# Patient Record
Sex: Male | Born: 1970 | Race: White | Hispanic: No | Marital: Single | Smoking: Current every day smoker
Health system: Southern US, Community
[De-identification: ages and names within clinical notes are randomized; demographics above are authoritative.]

## PROBLEM LIST (undated history)

## (undated) DIAGNOSIS — F319 Bipolar disorder, unspecified: Secondary | ICD-10-CM

## (undated) DIAGNOSIS — T1491XA Suicide attempt, initial encounter: Secondary | ICD-10-CM

## (undated) DIAGNOSIS — F329 Major depressive disorder, single episode, unspecified: Secondary | ICD-10-CM

## (undated) DIAGNOSIS — G8929 Other chronic pain: Secondary | ICD-10-CM

## (undated) DIAGNOSIS — M549 Dorsalgia, unspecified: Secondary | ICD-10-CM

## (undated) DIAGNOSIS — M1990 Unspecified osteoarthritis, unspecified site: Secondary | ICD-10-CM

## (undated) DIAGNOSIS — F200 Paranoid schizophrenia: Secondary | ICD-10-CM

## (undated) DIAGNOSIS — J45909 Unspecified asthma, uncomplicated: Secondary | ICD-10-CM

## (undated) DIAGNOSIS — J449 Chronic obstructive pulmonary disease, unspecified: Secondary | ICD-10-CM

## (undated) DIAGNOSIS — F32A Depression, unspecified: Secondary | ICD-10-CM

## (undated) DIAGNOSIS — J302 Other seasonal allergic rhinitis: Secondary | ICD-10-CM

---

## 2000-01-23 ENCOUNTER — Inpatient Hospital Stay (HOSPITAL_COMMUNITY): Admission: EM | Admit: 2000-01-23 | Discharge: 2000-01-28 | Payer: Self-pay | Admitting: *Deleted

## 2000-01-23 ENCOUNTER — Encounter: Payer: Self-pay | Admitting: Emergency Medicine

## 2000-01-23 ENCOUNTER — Emergency Department (HOSPITAL_COMMUNITY): Admission: EM | Admit: 2000-01-23 | Discharge: 2000-01-23 | Payer: Self-pay

## 2000-11-07 ENCOUNTER — Inpatient Hospital Stay (HOSPITAL_COMMUNITY): Admission: EM | Admit: 2000-11-07 | Discharge: 2000-11-12 | Payer: Self-pay | Admitting: Psychiatry

## 2005-01-02 ENCOUNTER — Emergency Department (HOSPITAL_COMMUNITY): Admission: EM | Admit: 2005-01-02 | Discharge: 2005-01-02 | Payer: Self-pay | Admitting: Emergency Medicine

## 2005-01-12 ENCOUNTER — Emergency Department (HOSPITAL_COMMUNITY): Admission: EM | Admit: 2005-01-12 | Discharge: 2005-01-12 | Payer: Self-pay | Admitting: Emergency Medicine

## 2006-03-28 ENCOUNTER — Emergency Department (HOSPITAL_COMMUNITY): Admission: EM | Admit: 2006-03-28 | Discharge: 2006-03-28 | Payer: Self-pay | Admitting: Emergency Medicine

## 2006-05-18 ENCOUNTER — Emergency Department (HOSPITAL_COMMUNITY): Admission: EM | Admit: 2006-05-18 | Discharge: 2006-05-18 | Payer: Self-pay | Admitting: Emergency Medicine

## 2006-05-26 ENCOUNTER — Emergency Department (HOSPITAL_COMMUNITY): Admission: EM | Admit: 2006-05-26 | Discharge: 2006-05-26 | Payer: Self-pay | Admitting: Emergency Medicine

## 2006-06-05 ENCOUNTER — Ambulatory Visit: Payer: Self-pay | Admitting: Family Medicine

## 2006-06-05 DIAGNOSIS — J45909 Unspecified asthma, uncomplicated: Secondary | ICD-10-CM | POA: Insufficient documentation

## 2006-06-05 DIAGNOSIS — F319 Bipolar disorder, unspecified: Secondary | ICD-10-CM | POA: Insufficient documentation

## 2006-06-05 DIAGNOSIS — J309 Allergic rhinitis, unspecified: Secondary | ICD-10-CM | POA: Insufficient documentation

## 2006-06-05 DIAGNOSIS — F250 Schizoaffective disorder, bipolar type: Secondary | ICD-10-CM | POA: Insufficient documentation

## 2006-06-05 DIAGNOSIS — M545 Low back pain: Secondary | ICD-10-CM

## 2006-06-05 DIAGNOSIS — R5383 Other fatigue: Secondary | ICD-10-CM

## 2006-06-05 DIAGNOSIS — F17200 Nicotine dependence, unspecified, uncomplicated: Secondary | ICD-10-CM | POA: Insufficient documentation

## 2006-06-05 DIAGNOSIS — L723 Sebaceous cyst: Secondary | ICD-10-CM

## 2006-06-05 DIAGNOSIS — R5381 Other malaise: Secondary | ICD-10-CM

## 2006-06-13 ENCOUNTER — Telehealth (INDEPENDENT_AMBULATORY_CARE_PROVIDER_SITE_OTHER): Payer: Self-pay | Admitting: Family Medicine

## 2006-06-13 ENCOUNTER — Encounter (INDEPENDENT_AMBULATORY_CARE_PROVIDER_SITE_OTHER): Payer: Self-pay | Admitting: Family Medicine

## 2006-06-13 ENCOUNTER — Ambulatory Visit (HOSPITAL_COMMUNITY): Admission: RE | Admit: 2006-06-13 | Discharge: 2006-06-13 | Payer: Self-pay | Admitting: Family Medicine

## 2006-06-14 ENCOUNTER — Encounter (INDEPENDENT_AMBULATORY_CARE_PROVIDER_SITE_OTHER): Payer: Self-pay | Admitting: Family Medicine

## 2006-06-15 LAB — CONVERTED CEMR LAB
Albumin: 4.4 g/dL (ref 3.5–5.2)
BUN: 12 mg/dL (ref 6–23)
Basophils Relative: 1 % (ref 0–1)
Calcium: 9.2 mg/dL (ref 8.4–10.5)
Eosinophils Absolute: 1.1 10*3/uL — ABNORMAL HIGH (ref 0.0–0.7)
Eosinophils Relative: 11 % — ABNORMAL HIGH (ref 0–5)
HCT: 50.1 % (ref 39.0–52.0)
Lymphs Abs: 3.4 10*3/uL — ABNORMAL HIGH (ref 0.7–3.3)
MCHC: 33.7 g/dL (ref 30.0–36.0)
MCV: 96.3 fL (ref 78.0–100.0)
Monocytes Relative: 10 % (ref 3–11)
Neutro Abs: 4.3 10*3/uL (ref 1.7–7.7)
Neutrophils Relative %: 44 % (ref 43–77)
Potassium: 4.8 meq/L (ref 3.5–5.3)
RBC: 5.2 M/uL (ref 4.22–5.81)
RDW: 13.2 % (ref 11.5–14.0)
Sodium: 144 meq/L (ref 135–145)
TSH: 2.309 microintl units/mL (ref 0.350–5.50)
Total Bilirubin: 0.7 mg/dL (ref 0.3–1.2)
Total CHOL/HDL Ratio: 5
Total Protein: 6.7 g/dL (ref 6.0–8.3)
WBC: 9.8 10*3/uL (ref 4.0–10.5)

## 2006-07-28 ENCOUNTER — Emergency Department (HOSPITAL_COMMUNITY): Admission: EM | Admit: 2006-07-28 | Discharge: 2006-07-28 | Payer: Self-pay | Admitting: Emergency Medicine

## 2006-07-29 ENCOUNTER — Ambulatory Visit: Payer: Self-pay | Admitting: *Deleted

## 2006-07-29 ENCOUNTER — Inpatient Hospital Stay (HOSPITAL_COMMUNITY): Admission: AD | Admit: 2006-07-29 | Discharge: 2006-08-01 | Payer: Self-pay | Admitting: *Deleted

## 2007-01-08 ENCOUNTER — Telehealth (INDEPENDENT_AMBULATORY_CARE_PROVIDER_SITE_OTHER): Payer: Self-pay | Admitting: Family Medicine

## 2007-01-08 ENCOUNTER — Emergency Department (HOSPITAL_COMMUNITY): Admission: EM | Admit: 2007-01-08 | Discharge: 2007-01-08 | Payer: Self-pay | Admitting: Emergency Medicine

## 2007-01-09 ENCOUNTER — Telehealth (INDEPENDENT_AMBULATORY_CARE_PROVIDER_SITE_OTHER): Payer: Self-pay | Admitting: *Deleted

## 2007-01-28 IMAGING — CR DG ABDOMEN 1V
1 series · 1 of 1 positions shown · non-contrast
Comparison: none

CLINICAL DATA: Involuntary commitment. Eating pennies.  
 ABDOMEN - [id number]: 
 Single view of the abdomen without previous films for comparison shows what appears to be a coin overlying the right lower quadrant of the abdomen.  There appears to be a single coin in the bowel.  No obstruction is seen.   There is some gas seen through the rectum.  The bones of the lumbar spine and pelvis are normal.

[view not recorded]
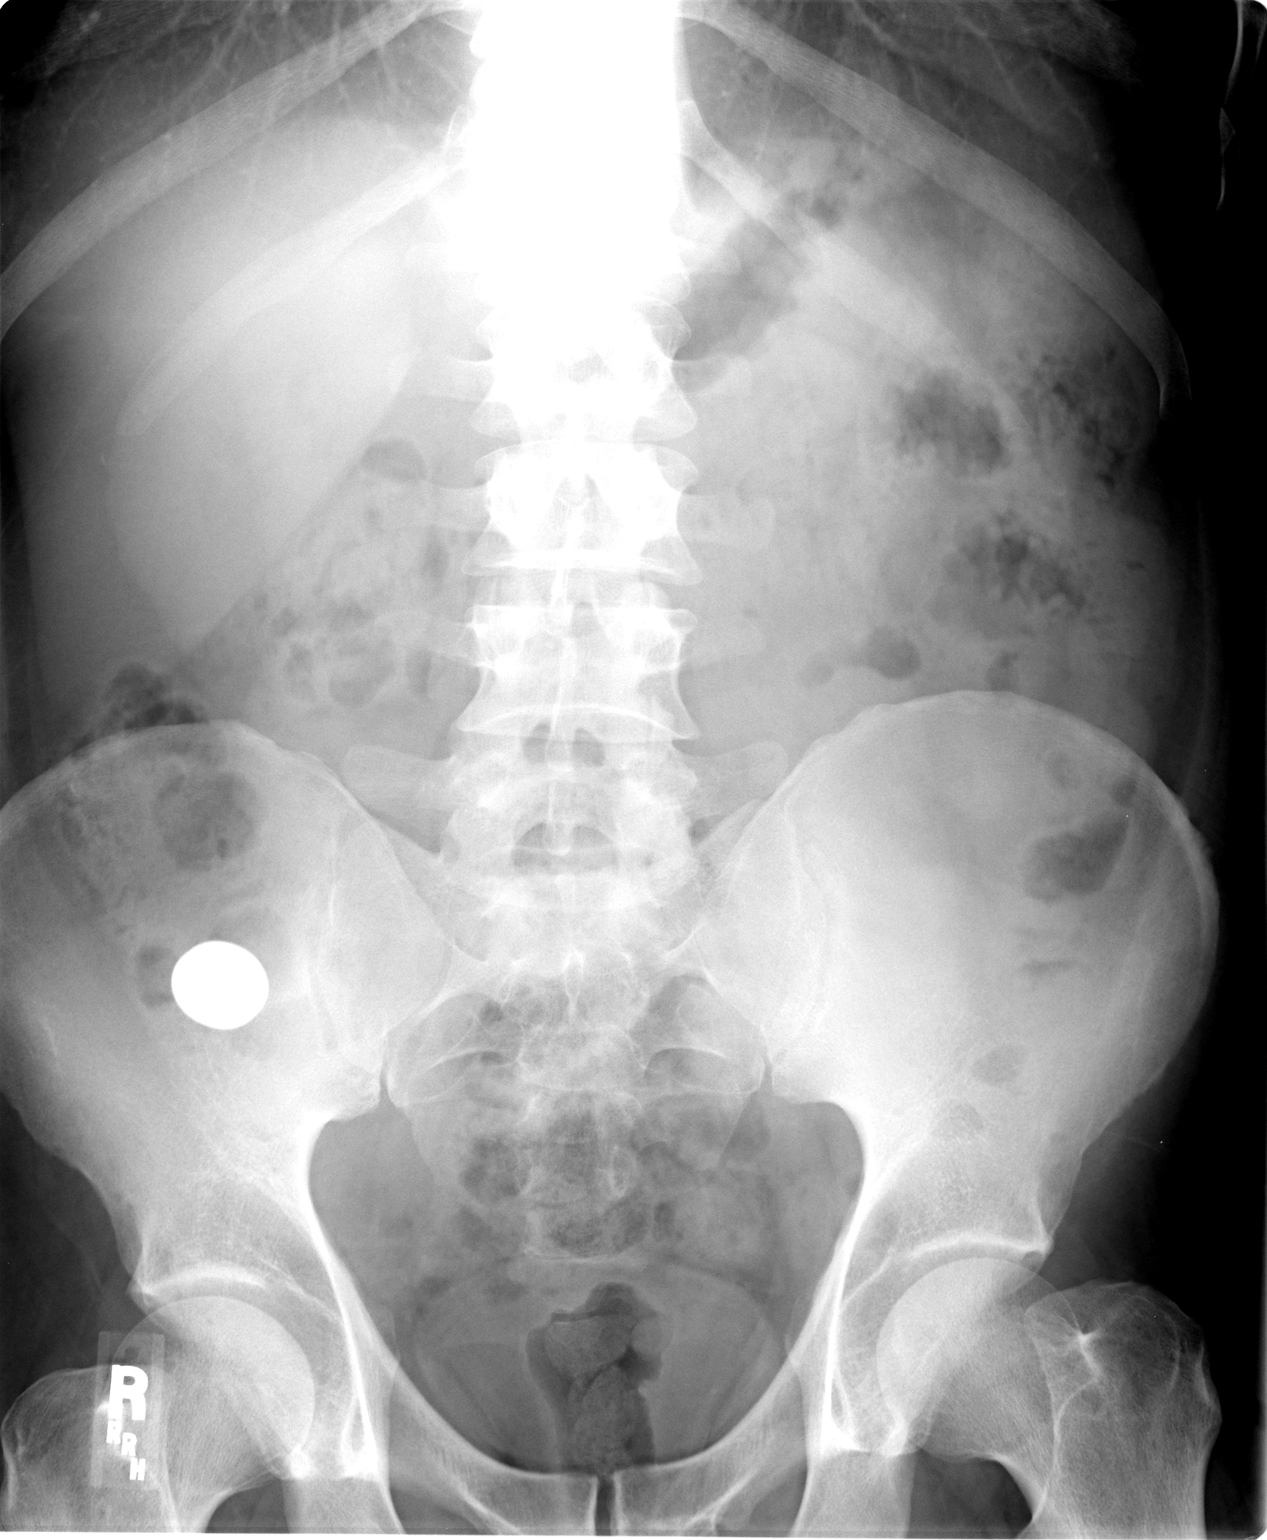

[1 of 1 positions shown; findings below may reference images not displayed]

IMPRESSION: Coin in right lower quadrant of the abdomen in the area of the cecum. No evidence of obstruction of the bowel.

## 2008-08-07 ENCOUNTER — Encounter (INDEPENDENT_AMBULATORY_CARE_PROVIDER_SITE_OTHER): Payer: Self-pay | Admitting: Family Medicine

## 2008-10-08 ENCOUNTER — Emergency Department: Payer: Self-pay | Admitting: Emergency Medicine

## 2008-10-11 ENCOUNTER — Emergency Department (HOSPITAL_COMMUNITY): Admission: EM | Admit: 2008-10-11 | Discharge: 2008-10-11 | Payer: Self-pay | Admitting: Emergency Medicine

## 2008-12-28 ENCOUNTER — Emergency Department: Payer: Self-pay | Admitting: Emergency Medicine

## 2009-06-01 ENCOUNTER — Emergency Department: Payer: Self-pay | Admitting: Emergency Medicine

## 2009-09-13 ENCOUNTER — Emergency Department (HOSPITAL_COMMUNITY): Admission: EM | Admit: 2009-09-13 | Discharge: 2009-09-13 | Payer: Self-pay | Admitting: Emergency Medicine

## 2009-11-09 ENCOUNTER — Emergency Department (HOSPITAL_COMMUNITY)
Admission: EM | Admit: 2009-11-09 | Discharge: 2009-11-09 | Payer: Self-pay | Source: Home / Self Care | Admitting: Emergency Medicine

## 2010-04-02 ENCOUNTER — Emergency Department (HOSPITAL_COMMUNITY)
Admission: EM | Admit: 2010-04-02 | Discharge: 2010-04-03 | Disposition: A | Discharge status: Nursing Home (Includes ICF) | Payer: PRIVATE HEALTH INSURANCE | Attending: Emergency Medicine | Admitting: Emergency Medicine

## 2010-04-02 DIAGNOSIS — F205 Residual schizophrenia: Secondary | ICD-10-CM | POA: Insufficient documentation

## 2010-04-02 LAB — DIFFERENTIAL
Basophils Relative: 1 % (ref 0–1)
Eosinophils Absolute: 1.4 10*3/uL — ABNORMAL HIGH (ref 0.0–0.7)
Eosinophils Relative: 12 % — ABNORMAL HIGH (ref 0–5)
Monocytes Absolute: 1.1 10*3/uL — ABNORMAL HIGH (ref 0.1–1.0)
Neutro Abs: 6.7 10*3/uL (ref 1.7–7.7)
Neutrophils Relative %: 54 % (ref 43–77)

## 2010-04-02 LAB — BASIC METABOLIC PANEL
BUN: 5 mg/dL — ABNORMAL LOW (ref 6–23)
Chloride: 104 mEq/L (ref 96–112)
GFR calc non Af Amer: >60 mL/min (ref >60)
Glucose, Bld: 105 mg/dL — ABNORMAL HIGH (ref 70–99)
Potassium: 3.9 mEq/L (ref 3.5–5.1)

## 2010-04-02 LAB — ETHANOL: Alcohol, Ethyl (B): <5 mg/dL (ref 0–10)

## 2010-04-02 LAB — CBC
Hemoglobin: 16.4 g/dL (ref 13.0–17.0)
MCH: 32.5 pg (ref 26.0–34.0)
MCHC: 35.4 g/dL (ref 30.0–36.0)
Platelets: 276 10*3/uL (ref 150–400)
RDW: 12.4 % (ref 11.5–15.5)

## 2010-04-02 LAB — RAPID URINE DRUG SCREEN, HOSP PERFORMED: Cocaine: NOT DETECTED

## 2010-05-13 LAB — RAPID URINE DRUG SCREEN, HOSP PERFORMED
Barbiturates: NOT DETECTED
Cocaine: NOT DETECTED
Tetrahydrocannabinol: NOT DETECTED

## 2010-05-13 LAB — ETHANOL: Alcohol, Ethyl (B): <5 mg/dL (ref 0–10)

## 2010-05-13 LAB — CBC
Hemoglobin: 15.5 g/dL (ref 13.0–17.0)
MCHC: 34.8 g/dL (ref 30.0–36.0)
RBC: 4.63 MIL/uL (ref 4.22–5.81)
WBC: 9.8 10*3/uL (ref 4.0–10.5)

## 2010-05-13 LAB — DIFFERENTIAL
Basophils Relative: 1 % (ref 0–1)
Eosinophils Absolute: 1.3 10*3/uL — ABNORMAL HIGH (ref 0.0–0.7)
Monocytes Absolute: 1.1 10*3/uL — ABNORMAL HIGH (ref 0.1–1.0)
Neutro Abs: 5.5 10*3/uL (ref 1.7–7.7)

## 2010-05-13 LAB — TRICYCLICS SCREEN, URINE: TCA Scrn: NOT DETECTED

## 2010-05-13 LAB — BASIC METABOLIC PANEL
Creatinine, Ser: 0.97 mg/dL (ref 0.4–1.5)
GFR calc Af Amer: >60 mL/min (ref >60)
Glucose, Bld: 90 mg/dL (ref 70–99)
Potassium: 3.5 mEq/L (ref 3.5–5.1)
Sodium: 138 mEq/L (ref 135–145)

## 2010-06-21 NOTE — H&P (Signed)
NAME:  Wesley Beck, Wesley [facility] NO.:  [id number]   MEDICAL RECORD NO.:  [id number]          PATIENT TYPE:  IPS   LOCATION:  0300                          FACILITY:  [facility]   PHYSICIAN:  Wesley Beck, M.D. DATE OF BIRTH:  1970/09/19   DATE OF ADMISSION:  07/29/2006  DATE OF DISCHARGE:                       PSYCHIATRIC ADMISSION ASSESSMENT   This is a voluntary admission to the services of Dr. Milford Beck.   This is a 40 year old single white male who presented to the emergency  department at [facility] around 6:00 last night.  He was  requesting to be admitted.  He felt homicidal.  He states that crack-  heads keep showing up at his house at 3:30 a.m. wanting to use the phone  to get drugs, and his step-mother and dad also told him that he was no  longer welcome at their house.  The patient does acknowledge using crack-  cocaine himself.  It varies from daily to several times a week.   PAST PSYCHIATRIC HISTORY:  He acknowledges having been admitted to [facility] 3 to 4 times, [facility] x2, and he is currently under the care of Dr.  Verlon [facility] in [facility].   SOCIAL HISTORY:  He has a GED.  He does roofing.  He has 1 son, age 38,  in [place].  He states that 5 to 6 years ago he was diagnosed with  mental illness and has been on disability since.   FAMILY HISTORY:  He had a maternal uncle who had issues with drugs and  alcohol.  His own drug and alcohol history shows that he began using  marijuana at age 79, cocaine since age 79, and he smokes 1 to 2 packs  per day of cigarettes.   PRIMARY CARE [facility]:  Dr. Erby Beck in [place] and Dr. Verlon Beck is his  psychiatrist.   MEDICAL PROBLEMS:  He has asthma, a herniated disk, and scoliosis.   MEDICATIONS:  He indicated on his intake that he is currently prescribed  Abilify 15 mg at h.s., Klonopin 1 mg at h.s. to help induce sleep.  He  states he uses that just periodically.  Vicodin, but he states  he has  not used that recently, and an albuterol inhaler for his asthma.   DRUG ALLERGIES:  No known drug allergies.   POSITIVE PHYSICAL FINDINGS:  He was medically cleared in the ED at [facility].  Other audible wheezing from his asthma, he was not found to have  any remarkable findings.  Specifically, his CBC and labs were within  normal limits.  His UA was unremarkable.  His urine drug screen,  however, was positive for marijuana and cocaine.   MENTAL STATUS EXAM:  Today, he is alert and oriented x3.  He is causally  groomed, dressed and nourished.  His speech is within normal limits.  His mood is appropriate to the situation.  His affect is slightly  depressed.  Thought processes are clear, rational and goal-oriented.  He  is asking for help to get relocated to either [place] or [place].  Judgment and insight are  good.  Concentration and memory are good.  Intelligence is at least average.  He reports that he is still  homicidal.  He states that he keeps a roofing knife in his pocket, and  he was afraid he was going to hurt these people that come to his house.  He denies being suicidal, and he denies any auditory/visual  hallucinations.   DIAGNOSES:  AXIS I:  Schizoaffective disorder, polysubstance dependence.  AXIS II:  Deferred.  AXIS III:  Asthma, herniated disk, scoliosis.  AXIS IV:  Severe, issues with primary support group, occupational,  house, economic issues.  AXIS V:  35.   PLAN:  Admit for safety and stabilization, to establish compliance with  his outpatient medications, and we will have his case manager help with  after discharge placement.  He is hoping to get in to another long-term  drug rehab program, either in [place] or [place].      Wesley Beck, P.A.-C.      Wesley Beck, M.D.  Electronically Signed    MD/MEDQ  D:  07/29/2006  T:  07/30/2006  Job:  [id number]

## 2010-06-21 NOTE — Discharge Summary (Signed)
NAME:  Wesley Beck, Wesley Beck NO.:  [id number]   MEDICAL RECORD NO.:  [id number]          PATIENT TYPE:  IPS   LOCATION:  0300                          FACILITY:  [facility]   PHYSICIAN:  Jasmine Pang, M.D. DATE OF BIRTH:  1970/11/16   DATE OF ADMISSION:  07/29/2006  DATE OF DISCHARGE:  08/01/2006                               DISCHARGE SUMMARY   IDENTIFICATION:  This is a 40 year old single white male who was  admitted on a voluntary basis on July 29, 2006.  The patient reportedly  presented at the emergency department at [facility].  He was  requesting to be admitted.  He stated he felt homicidal.  He stated that  crack heads keep showing up at his house at 3:30 a.m. wanting to use the  phone to get drugs.  He also said his stepmother and his father told him  he was no longer welcome at their house.  The patient does acknowledge  using crack cocaine himself.  It varies from daily to several times a  week.  He acknowledges having been admitted to [facility] three to  four times and [facility] x2.  He is currently under the care of Dr. Duayne Cal in  [facility].  He had a maternal uncle who had  issues with drugs and alcohol.  His own drug and alcohol history shows  that he began marijuana at age 41 and cocaine age 41.  He smokes one to  two packs of cigarettes per day.  He has asthma, herniated disk and  scoliosis.  He was prescribed Abilify 15 mg p.o. q.h.s. and Klonopin 1  mg at bedtime to help induce sleep.  He states he uses these just  periodically.  He also uses an albuterol inhaler for his asthma.  He has  no known drug allergies.   PHYSICAL FINDINGS:  The patient was medically cleared in the ED at [facility].  Other than audible wheezing from his asthma,  he was not found to  have remarkable findings.   ADMISSION LABORATORIES:  Specifically his CBC and labs were within  normal limits.  His urinalysis was unremarkable.  Urine drug  screen  however was positive for cocaine and marijuana.   HOSPITAL COURSE:  Upon admission the patient was started on Ambien 10 mg  p.o. may repeat x1,  Zyprexa Zydis 5 mg p.o. x1 p.r.n. agitation,  albuterol inhaler 2 puffs p.o. b.i.d. p.r.n.  On July 29, 2006 he was  restarted on Abilify 15 mg p.o. q.h.s.  The patient tolerated these  medications well with no significant side effects.  He was initially  somewhat reserved and sleepy, but cooperative with the exam.  He  discussed his crack abuse.  He wants to relocate.  He states he gets  disability for schizophrenia.  He discussed his other hospitalizations  at [facility], [facility] and [facility].  He states he was using marijuana and  cocaine before coming in.  During the hospitalization, he had some  dysphoria and anxiety.  He was worried about where he was going to go  when he leaves.  He was worried because his license was revoked because  he failed to pay court costs for speeding tickets.  His sleep was good.  He had talked with his father is states this conversation went well.  He  at one time appeared to be responding to internal stimuli, but overall  there were no obvious auditory or visual hallucinations.  On August 01, 2006 mental status had improved.  He was friendly and cooperative.  Good  eye contact.  Psychomotor activity within normal limits.  Speech normal  rate and flow.  His mood was euthymic.  Affect wide range.  There was no  suicidal or homicidal ideation.  No paranoia or delusions.  Thoughts  were logical and goal-directed.  Thought content no predominant theme.  Cognitive was grossly back to baseline.  It was felt the patient was  safe to be discharged today.  He was no longer homicidal.   DISCHARGE DIAGNOSES:  AXIS I:  1. Schizoaffective disorder.  2. Polysubstance dependence.  AXIS II:  None.  AXIS III:  1. Asthma.  2. Herniated disk.  3. Scoliosis.  AXIS IV:  Severe (problems with primary support group,  occupational  problem, housing problem, economic problem, burden of psychiatric  illness, burden of medical illness.)  AXIS V:  Global assessment of functioning upon discharge was 50.  Global  assessment of functioning upon admission was 35.  Global assessment of  functioning highest past year was 60.   DISCHARGE PLANS:  There were no specific activity level or dietary  restrictions.   POST HOSPITAL CARE PLANS:  The patient will followup at the [facility] with Dr. Duayne Cal.  This appointment is being  arranged by our case manager.   DISCHARGE MEDICATIONS:  1. Abilify 15 mg at bedtime.  2. Albuterol 2 puffs twice daily as needed for shortness of breath.  3. Ambien 10 mg at bedtime if needed for insomnia.      Jasmine Pang, M.D.  Electronically Signed     BHS/MEDQ  D:  08/01/2006  T:  08/01/2006  Job:  [id number]

## 2010-06-24 NOTE — Discharge Summary (Signed)
[facility]  Patient:    Wesley Beck, Wesley Beck                    MRN: [id number] Adm. Date:  [id number] Disc. Date: [id number] Attending:  Denny Peon                           Discharge Summary  INTRODUCTION:  Wesley Beck is a 40 year old single white male who was admitted on a voluntary basis.  The patient reported seeing light, hearing voices, and feeling confused with his thoughts.  He stated that he could communicate with angels by putting his hands on the TV screen.  He was pretty incoherent and disorganized during the initial examination.  He was also suspicious and guarded.  The patient denied dangerous ideations but it was felt that he needed to stay in the hospital in order to adjust his medications and improve his contact with reality.  HOSPITAL COURSE:  After admitting to the ward, the patient was placed on special observation.  He was started on Risperdal 4 mg at bedtime and Haldol 10 mg every six hours was used for agitation.  Family session was ordered and Risperdal was increased to 5 mg at bedtime.  On January 25, 2000, he was pleasant, cooperative and calm but had auditory hallucinations in the form of spirits talking to him.  He tolerated medication well.  The dose of medication was increased to 5 mg of Risperdal at bedtime. The next day the hallucinations started getting better, "talking to spirits" decreased.  A case worker had the chance to see the patients father.  The idea after discharge will be to return home with more supervision from the family.  Father felt that the patient made some significant improvement while in the hospital.  On January 27, 2000, the patient continued improvement with further decreasing auditory hallucinations.  Affect was brighter and he was more out of his room, participating in ward activities.  On January 28, 2000, he was seen prior to discharge by Francis Dowse A. Claudette Head, M.D., who was on call.   The patient seemed to be stable, denies hearing voices, but still his actional thoughts were affected by spirits.  He strongly denied further any dangerous ideations.  Behavior seemed to be appropriate.  Following previous arrangement, the decision was made to discharge the patient to outpatient treatment with [facility].  MEDICAL PROBLEMS:  The patient had conjunctivitis during this hospitalization which was treated with gentamicin ophthalmic ointment.  Vital signs were stable with normal blood pressure, temperature, and respiratory rate.  Blood work showed normal chemistry-7, normal hemoglobin and hematocrit. Drug screen negative for substance of abuse.  Vital signs, as mentioned before, were stable throughout hospitalization.  DISCHARGE DIAGNOSES: AXIS I.   1. Schizophrenia, paranoid type, chronic.           2. Cannabis, cocaine, and alcohol abuse. AXIS II.  Diagnosis deferred. AXIS III. Bilateral conjunctivitis. AXIS IV.  Psychosocial stressors moderate to severe related to mental           problems. AXIS V.   Global Assessment of Functioning upon admission 35, highest for           the past year estimated was 60, discharge Global Assessment of           Functioning was 55.  DISCHARGE RECOMMENDATIONS:  The patient received sample of Risperdal to take to and half 2  mg tablets at bedtime, Benadryl 50 mg one tablet at bedtime, and ophthalmic solution, Tobramycin ophthalmic solution 0.3% two drops to each eye four times a day.  He has an appointment at [facility] February 06, 2000, at 12:30 p.m.  The patient understood the instructions.  At the time of discharge he did not experience any side effects from the medications.  In the case of deterioration, he will come to the emergency room or [facility]. DD:  03/09/00 TD:  03/12/00 Job: [id number]

## 2010-06-24 NOTE — H&P (Signed)
[facility]  Patient:    Wesley Beck, Wesley Beck                    MRN: [id number] Adm. Date:  [id number] Attending:  Denny Peon Dictator:   Wesley Beck, N.P.                         History and Physical  IDENTIFYING INFORMATION:  Wesley Beck is a 40 year old whites single male admitted on a voluntary basis January 23, 2000.  CHIEF COMPLAINT:  "They told me I was schizophrenic, paranoid."  HISTORY OF PRESENT ILLNESS:  The patient first started out by telling me that his dad is worried about him.  His dad thinks the patient needs disability. The patient states "I see my spirits."  The patient reports seeing lights and he watches the water moving from one eye to another, told me "I consider you an angel."  He does have a history of auditory hallucinations in the past, denies having any auditory hallucinations currently.  He denies suicidal ideation or homicidal ideation and intent.  He does state sometimes he thinks people are following him like angels or God.  He does feel spirits inside, "It feels good, it feels like a thought to me sometimes in my head an my stomach and sometimes I get a jolt in my prostate sometimes.  I thinks thats chemistry, maybe a bridge being built or burned."  He states sometimes he does put his hands on the TV screen to let the angels know who he is.  He reports sleep is good.  Appetite is good with a weight gain of 20 pounds since being at [facility].  He denies a history of seizures, DTs, or blackouts. He apparently has a past history of substance abuse and he continues to use substances although maybe not as much as in the past.  PAST PSYCHIATRIC HISTORY:  The patient apparently goes to [facility] and he has been there going two years and sees [facility]. He also sees [facility], a psychiatrist there.  He has been [facility] x 3. He was discharged on January 20, 2000.  He had  been at [facility] for three weeks.  He had also been at [facility] for detoxification in 1996 and he is sponsored here at the [facility] for five days.  SOCIAL HISTORY:  The patient states he lives alone with his 28-year-old son. His family lives one mile away from him.  Apparently the patient is single. The mother of his child raised their son the first three years.  The patient has had the son for the last two years.  The childs mother lives in [place]. The patient apparently has a very supportive family.  Parents are living.  He has two stepbrothers and one sister.  He completed the 12th grade.  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  He denies any financial problems.  He son receives Medicaid.  The patient apparently receives money for working for his dad.  Family history: Maternal grandmother had some type of problems that he does not know.  SUBSTANCE ABUSE HISTORY:  The patient states he uses alcohol two to three beers about two times a months.  This is not what he told the assessment team. He states he has used cocaine in the past; last time October 17, 1999.  He uses marijuana about once a  week, smokes a pack of cigarettes a day.  He does have a past history of being on probation for drugs at the age of 39.  PAST MEDICAL HISTORY:  Primary care [facility] is at [facility].  He has not seen them in over a year.  Medical problems include asthma, seasonal allergies, frequent bronchitis.  He either has conjunctivitis both eyes or allergies of his both eyes.  The chart states that he had an allergic reaction to polyester pillows at [facility]; however, he did describe his eye as being closed this morning with exudate.  Medications currently: Risperdal 4 mg at h.s. p.o.; been on this for two years.  Albuterol two puffs p.r.n. b.i.d. for his asthma.  Drug allergies: No known drug allergies; question of being allergic to POLYESTER PILLOWS.  PHYSICAL  EXAMINATION:  Please see physical examination done at [facility] on January 23, 2000.  Temperature 97.8, pulse 106, respirations 24, blood pressure 136/82, height 5 feet 9 inches, weight 202 pounds.  Waiting on physical examination from [facility] but he does have some wheezing that I can hear audibly.  He appears to have either conjunctivitis and [facility] was to determine if it was conjunctivitis versus allergies with recommendation for treatment.  We are asking that this be faxed to [place].  CURRENT MENTAL STATUS EXAMINATION:  Tall, young adult white male, casually dressed.  Good eye contact; however, his eyes are red and slightly swollen and at times it appears like he has difficulty opening his eyes.  Very cooperative and pleasant.  Speech: Low tone but relevant.  Mood: Slightly anxious.  Affect is flat.  He denies suicidal ideation or intent, denies homicidal ideation or intent.  Thought processes: Visual hallucinations, delusional about angels and spirits, some paranoia with some disorganized thinking.  He states that "God could be taping my conversations."  Cognitive: Alert and oriented, cognitive functioning appears to be intact.  Judgment: Poor.  Insight: Poor.  Impulse control: Poor.  CURRENT DIAGNOSES: Axis I:    1. Psychotic disorder, not otherwise specified.            2. Rule out schizophrenia.            3. Cannabis dependence.            4. Cocaine abuse.            5. Alcohol abuse. Axis II:   Deferred. Axis III:  1. Seasonal allergies.            2. Rule out conjunctivitis bilateral eyes.            3. History of hypercholesterolemia. Axis IV:   Severe related to his mental illness. Axis V:    Current global assessment of functioning 35, highest in the past            year 60.  TREATMENT PLAN AND RECOMMENDATIONS:  Voluntary admission to [facility].  We will maintain his safety, check every 15 minutes, and he is able to be safe and contracts for  safety on the unit.  Continue Risperdal 4 mg p.o. h.s.  We can give him Haldol 5 mg p.o. and Benadryl 50 mg stat while at [facility] on January 23, 2000.  We will continue the albuterol two  puffs b.i.d. p.r.n. for his wheezing and asthma, Benadryl 50 mg p.o. at h.s. Schedule a family session with his parents.  We were going to start him on Claritin but decided to discontinue  this.  TENTATIVE LENGTH OF STAY:  Five days. DD:  01/24/00 TD:  01/24/00 Job: [id number]

## 2010-06-24 NOTE — H&P (Signed)
[facility]  Patient:    Wesley Beck, Wesley Beck Visit Number: [id number] MRN: [id number]          Service Type: PSY Location: 30 0301 01 Attending Physician:  Jeanice Lim Dictated by:   Young Berry Scott, N.P. Admit Date:  11/07/2000                     Psychiatric Admission Assessment  DATE OF ASSESSMENT:  November 08, 2000 at 10:45 a.m.  IDENTIFYING INFORMATION:  This is a 40 year old male, who is single, Caucasian.  He is an involuntary admission for threatening behavior and suicidal thoughts.  HISTORY OF PRESENT ILLNESS:  The patient was referred for admission because he has been going around his neighborhood knocking on doors and asking for money in a threatening manner.  The neighbors felt intimidated and called his father.  He has been asking for money from the neighbors in order to buy cocaine.  He told the emergency room physician that he was interested in getting help and that he was very ashamed of relapsing on cocaine after being clean for 4-5 years and he has had thoughts of stabbing himself.  He reports that he has relapsed in the last few months.  He has been smoking as much marijuana as he can get his hands on and using as much cocaine as he can get his hands on also but has quickly run out of money.  He denies any homicidal ideation or any auditory or visual hallucinations.  He does report some stress of his responsibilities of being a single parent caring for his 84-year-old child.  Under the influence of cocaine, his sleep has been disrupted and his appetite has been disrupted, although he reports no weight change that he is aware of.  He has not been sleeping regularly and staying up late at night. He was knocking on the neighbors doors, in fact, at 3:00 in the morning. Today, he is able to promise safety.  He does have some suicidal ideation.  PAST PSYCHIATRIC HISTORY:  He is followed by [facility], Dr.  Maylene Roes for many years.  He does have a history of prior inpatient admissions at [facility] and for detox from cocaine in the past and to [facility] x 2 for both treatment of substance abuse and also of his schizophrenia.  The patient has been diagnosed with schizophrenia for many years and takes Risperdal for this.  SOCIAL HISTORY:  The patient was educated through the 12th grade.  He has a GED.  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 His work is intermittent in nature.  He is getting a learners permit in order to obtain a commercial drivers license.  The patient lives with his 35-year-old son, who is in the first grade.  The patients father is supportive of him and is the one who filed the petition to bring him in.  He first took him to mental health, where the patient met another person in the waiting room and left to go smoke some crack.  ALCOHOL/DRUG HISTORY:  As already noted above, patient denies use of alcohol. He is a non-tobacco user.  MEDICAL HISTORY:  The patient is followed by [facility] in [place].  Last seen there two months ago for a DOT physical.  The patient denies medical problems.  MEDICATIONS:  Risperdal 5 mg at h.s. and states that he is compliant with this.  DRUG  ALLERGIES:  None.  POSITIVE PHYSICAL FINDINGS:  The patient was seen in the emergency room at [facility] and medically cleared there.  His urine drug screen was positive for cocaine and marijuana.  His alcohol level was less than 5.  Vital signs, on admission to the unit, were temperature 98.5, pulse 83, respirations 22.  He is 5 feet 9 inches tall and weighs 198 pounds.  His blood pressure was 130/79.  The patient currently has a CBC, metabolic panel and we will get a thyroid panel on him also.  Those are pending.  MENTAL STATUS EXAMINATION:  This is a disheveled white male, who is fully alert and cooperative with a blunted affect.  His  speech shows no spontaneity. His responses are minimal but normal pace and tone.  No pressure noted.  Mood is mildly depressed.  Thought process is positive for suicidal ideation with thoughts of stabbing himself and he promises safety on the unit.  No evidence of auditory or visual hallucinations.  No homicidal ideation.  Cognitively, he is intact.  DIAGNOSES: Axis I:    1. Rule out substance-induced mood disorder.            2. Cocaine abuse; rule out dependence.            3. Marijuana abuse.            4. Schizophrenia not otherwise specified. Axis II:   Deferred. Axis III:  Seasonal allergies. Axis IV:   Mild to moderate (stress with the primary support group and caring            for his 11-year-old son). Axis V:    Current 40; past year 40.  PLAN:  Involuntarily admit the patient to a dual-diagnosis program.  The patient promises safety.  We will start him on Symmetrel 100 mg p.o. b.i.d. We will continue his Risperdal 5 mg p.o. daily at h.s. and we will see how he interacts in group and we will consider starting him on Lexapro for his suicidal ideation.  ESTIMATED LENGTH OF STAY:  Two to four days. Dictated by:   Young Berry Scott, N.P. Attending Physician:  Jeanice Lim DD:  11/08/00 TD:  11/08/00 Job: [id number]

## 2010-06-24 NOTE — Discharge Summary (Signed)
[facility]  Patient:    Wesley Beck, Wesley Beck Visit Number: [id number] MRN: [id number]          Service Type: PSY Location: 30 0301 02 Attending Physician:  Jeanice Lim Dictated by:   Haskel Khan, M.D. Admit Date:  11/07/2000 Discharge Date: 11/12/2000                             Discharge Summary  IDENTIFYING DATA:  This is a 40 year old, single, Caucasian male who was involuntarily admitted for threatening behavior and suicidal thoughts, apparently going around his neighborhood, knocking on doors, asking for money in a threatening manner.  Neighbors felt intimidated.  Apparently he was attempting to get money to buy cocaine.  He is also a single parent caring for a 74-year-old child complaining of disrupted sleep, appetite and mood, having to relapse on cocaine.  ADMISSION MEDICATION:  Risperdal 5 mg q.h.s. which he reported compliance with.  ALLERGIES:  No known drug allergies.  PHYSICAL EXAMINATION:  Essentially within normal limits.  Vital signs stable, afebrile, in no acute distress; neurologically, grossly nonfocal, intact.  MENTAL STATUS EXAMINATION:  The patient was alert and oriented, somewhat dishelveled.  Speech showed no spontaneity.  His mood was mildly depressed. Affect somewhat blunted.  Thought process was goal directed.  Thought content positive for suicidal ideation with thoughts of stabbing himself, contracting for safety on the unit and no report of auditory-visual hallucinations, paranoid ideation or delusions.  ADMISSION DIAGNOSES: Axis I:     1. Substance-induced mood disorder.             2. Cocaine abuse disorder.             3. Marijuana abuse.             4. Schizophrenia not otherwise specified. Axis II:    None. Axis III:   Seasonal allergies. Axis IV:    Mild to moderate. Axis V:     30/60 over the last year.  LABORATORY DATA:  Routine admission labs were essentially within normal limits including  a thyroid profile, a T3 uptake was 38.2 slightly elevated, CBC with differential was within normal limits except for a slightly elevated white blood cell count of 11.2, comprehensive metabolic panel was within normal limits.  HOSPITAL COURSE:  The patient was admitted with routine p.r.n. medication ordered as well as individual, group, and milieu therapy for monitoring of withdrawal and behavior and stabilization on medications as indicated. The patient was resumed on 4 mg Risperdal at night due to possibility of noncompliance and Lexapro 10 mg q.a.m. as well as Ativan 0.5 p.r.n., Symmetrel started at 100 b.i.d. and Risperdal increased to 5 mg q.h.s.  Lexapro was discontinued due to previously being on Celexa and 10 mg was started in the morning.  CONDITION AT DISCHARGE:  The patient was improved but not recovered, mood had stabilized, affect was no longer labile.  There is no psychotic symptoms nor suicidal, homicidal or violent ideation.  Judgment and insight had improved. He reported feeling safe and was motivated to be abstinent from cocaine and follow up with psychiatric and substance abuse treatment.  DISPOSITION:  He was discharged to follow up at the [facility] Tuesday, November 13, 2000 at 9:30, to attend NA and substance abuse treatment and was discharged on medications.  DISCHARGE MEDICATIONS:  Risperdal 4 mg q.h.s./Risperdal 1 mg q.h.s. totalling 5 mg and  Celexa 10 mg q.a.m.  DISCHARGE DIAGNOSES: Axis I:     1. Substance-induced mood disorder.             2. Cocaine abuse disorder.             3. Marijuana abuse.             4. Schizophrenia not otherwise specified. Axis II:    None. Axis III:   Seasonal allergies. Axis IV:    Mild to moderate. Axis V:     Global Assessment of Functioning on discharge was 50. Dictated by:   Haskel Khan, M.D. Attending Physician:  Jeanice Lim DD:  12/26/00 TD:  12/27/00 Job:  [id number]

## 2010-11-23 LAB — BASIC METABOLIC PANEL
CO2: 34 — ABNORMAL HIGH
Calcium: 8.4
Chloride: 104
Creatinine, Ser: 1.1
GFR calc Af Amer: >60
GFR calc non Af Amer: >60
Potassium: 4

## 2010-11-23 LAB — URINALYSIS, ROUTINE W REFLEX MICROSCOPIC
Glucose, UA: NEGATIVE
Hgb urine dipstick: NEGATIVE
Ketones, ur: NEGATIVE
Nitrite: NEGATIVE
Specific Gravity, Urine: >1.03 — ABNORMAL HIGH
Urobilinogen, UA: 0.2
pH: 6

## 2010-11-23 LAB — RAPID URINE DRUG SCREEN, HOSP PERFORMED
Amphetamines: NOT DETECTED
Barbiturates: NOT DETECTED
Tetrahydrocannabinol: POSITIVE — AB

## 2010-11-23 LAB — HEPATIC FUNCTION PANEL
ALT: 30
AST: 25
Total Protein: 5.3 — ABNORMAL LOW

## 2010-11-23 LAB — DIFFERENTIAL
Basophils Absolute: 0
Basophils Relative: 0
Eosinophils Relative: 11 — ABNORMAL HIGH
Lymphs Abs: 2.6
Monocytes Relative: 8

## 2010-11-23 LAB — CBC: HCT: 40

## 2010-11-23 LAB — TSH: TSH: 2.263

## 2011-10-09 IMAGING — CR DG CHEST 2V
3 series · 3 of 3 positions shown · non-contrast
Comparison: 01/12/2005

CLINICAL DATA: Shortness of breath, wheezing, chest pain, cough,
smoker, asthma

CHEST - 2 VIEW

[view not recorded (1 of 3)]
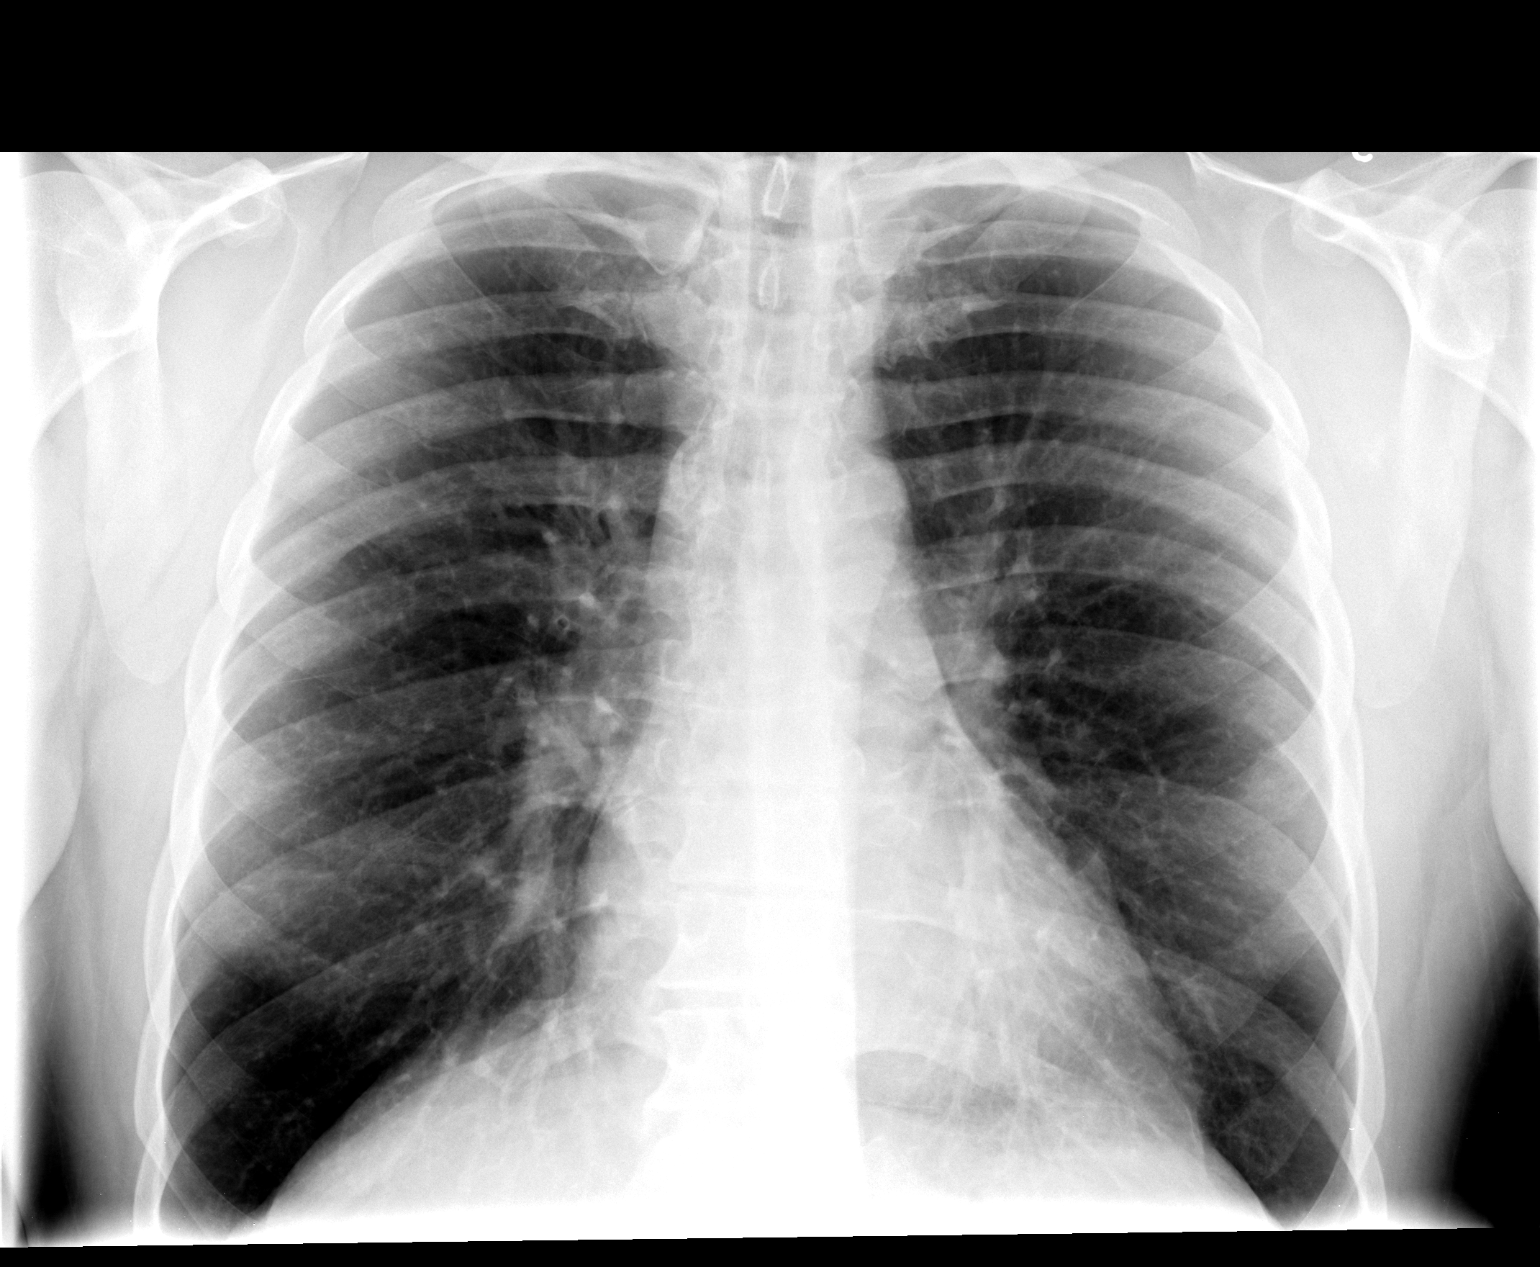

[view not recorded (2 of 3)]
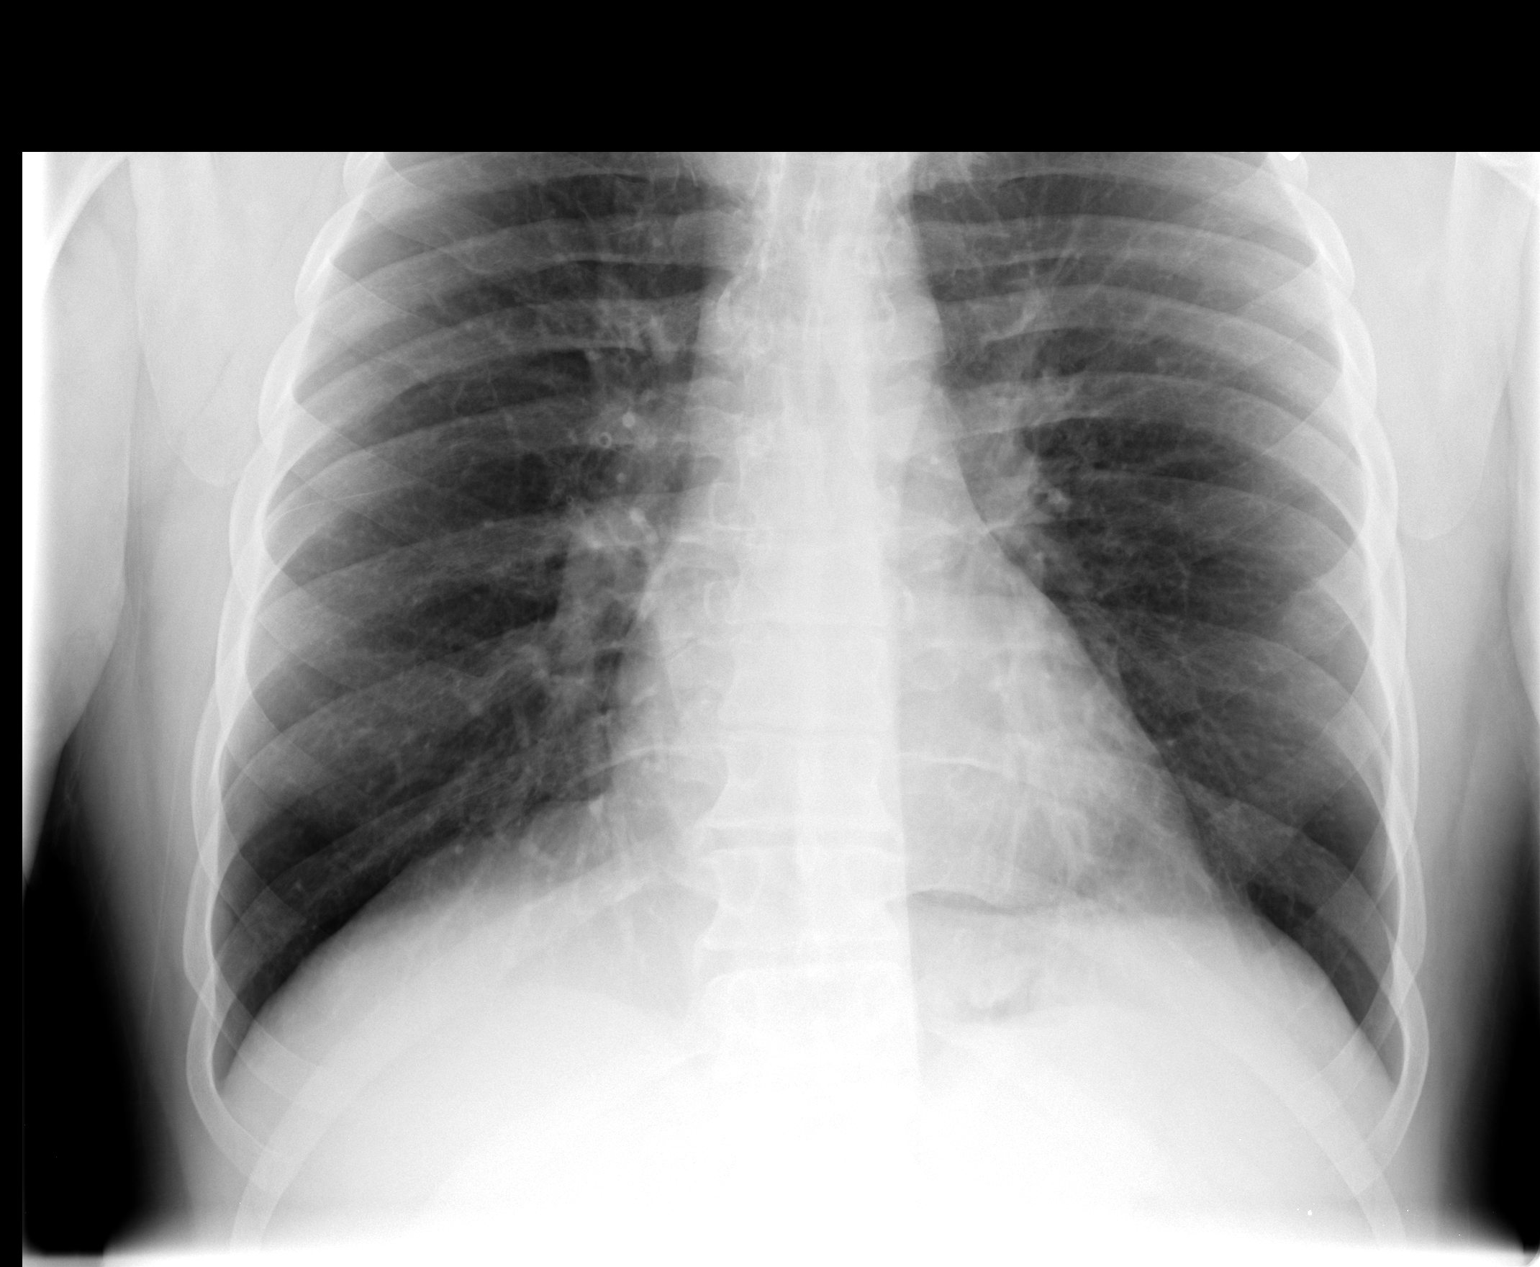

[view not recorded (3 of 3)]
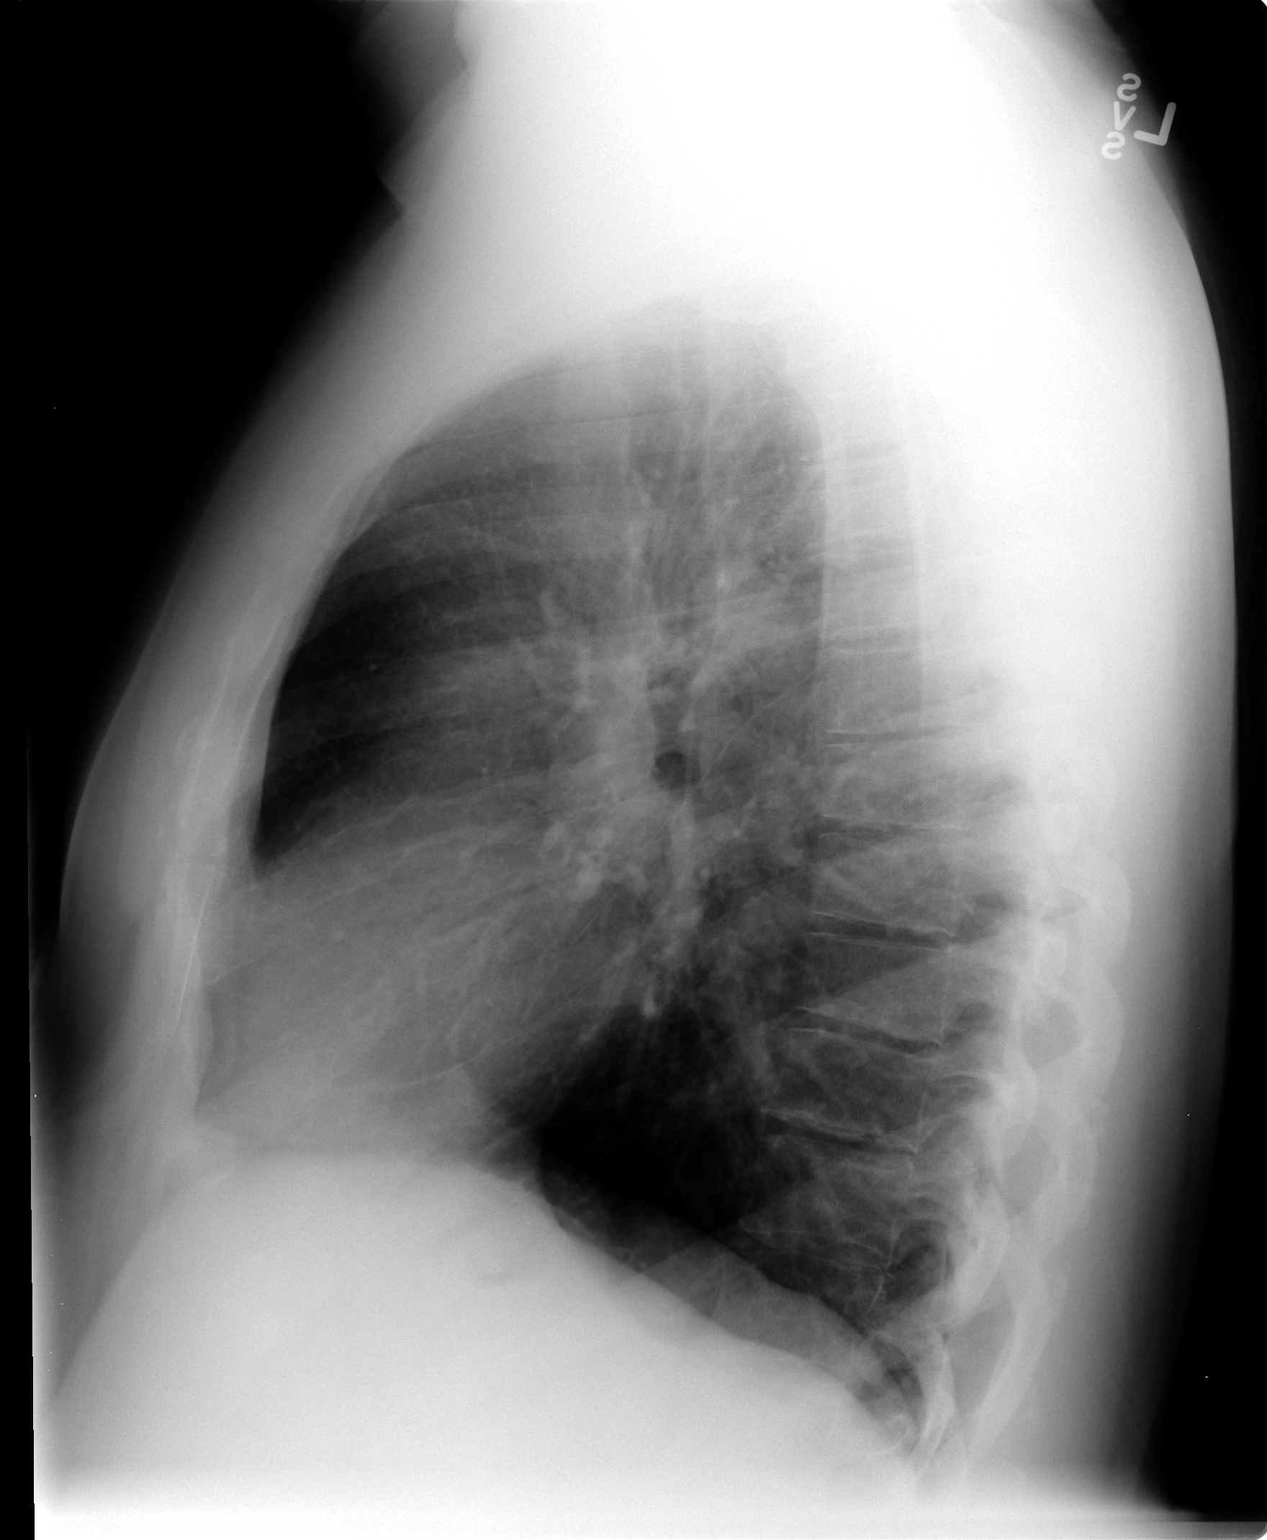

[3 of 3 positions shown; findings below may reference images not displayed]

FINDINGS: Normal heart size, mediastinal contours, and pulmonary vascularity.
Chronic peribronchial thickening.
No acute infiltrate, pleural effusion, or pneumothorax.
Bones unremarkable.
IMPRESSION: Chronic peribronchial thickening, which can be seen with chronic
bronchitis or asthma.
No acute abnormalities.

## 2012-07-31 ENCOUNTER — Emergency Department (HOSPITAL_COMMUNITY)
Admission: EM | Admit: 2012-07-31 | Discharge: 2012-07-31 | Disposition: A | Discharge status: Home / Self Care | Payer: Medicaid Other | Attending: Emergency Medicine | Admitting: Emergency Medicine

## 2012-07-31 ENCOUNTER — Encounter (HOSPITAL_COMMUNITY): Payer: Self-pay | Admitting: Emergency Medicine

## 2012-07-31 ENCOUNTER — Emergency Department (HOSPITAL_COMMUNITY): Payer: Medicaid Other

## 2012-07-31 DIAGNOSIS — Z79899 Other long term (current) drug therapy: Secondary | ICD-10-CM | POA: Insufficient documentation

## 2012-07-31 DIAGNOSIS — G8929 Other chronic pain: Secondary | ICD-10-CM | POA: Insufficient documentation

## 2012-07-31 DIAGNOSIS — F17200 Nicotine dependence, unspecified, uncomplicated: Secondary | ICD-10-CM | POA: Insufficient documentation

## 2012-07-31 DIAGNOSIS — J45901 Unspecified asthma with (acute) exacerbation: Secondary | ICD-10-CM | POA: Insufficient documentation

## 2012-07-31 DIAGNOSIS — F200 Paranoid schizophrenia: Secondary | ICD-10-CM | POA: Insufficient documentation

## 2012-07-31 HISTORY — DX: Paranoid schizophrenia: F20.0

## 2012-07-31 HISTORY — DX: Unspecified osteoarthritis, unspecified site: M19.90

## 2012-07-31 HISTORY — DX: Bipolar disorder, unspecified: F31.9

## 2012-07-31 HISTORY — DX: Other chronic pain: G89.29

## 2012-07-31 HISTORY — DX: Unspecified asthma, uncomplicated: J45.909

## 2012-07-31 HISTORY — DX: Other seasonal allergic rhinitis: J30.2

## 2012-07-31 HISTORY — DX: Dorsalgia, unspecified: M54.9

## 2012-07-31 MED ORDER — IPRATROPIUM BROMIDE 0.02 % IN SOLN
1.0000 mg | Freq: Once | RESPIRATORY_TRACT | Status: AC
  Administered 2012-07-31: 1 mg via RESPIRATORY_TRACT
  Filled 2012-07-31: qty 5

## 2012-07-31 MED ORDER — PREDNISONE 50 MG PO TABS
60.0000 mg | ORAL_TABLET | Freq: Once | ORAL | Status: AC
  Administered 2012-07-31: 60 mg via ORAL
  Filled 2012-07-31: qty 1

## 2012-07-31 MED ORDER — PREDNISONE 20 MG PO TABS: 40.0000 mg | ORAL_TABLET | Freq: Every day | ORAL | Status: DC

## 2012-07-31 MED ORDER — ALBUTEROL SULFATE HFA 108 (90 BASE) MCG/ACT IN AERS
2.0000 | INHALATION_SPRAY | RESPIRATORY_TRACT | Status: AC
  Administered 2012-07-31: 2 via RESPIRATORY_TRACT
  Filled 2012-07-31: qty 6.7

## 2012-07-31 MED ORDER — ALBUTEROL SULFATE (5 MG/ML) 0.5% IN NEBU
2.5000 mg | INHALATION_SOLUTION | Freq: Once | RESPIRATORY_TRACT | Status: DC
  Filled 2012-07-31: qty 0.5

## 2012-07-31 MED ORDER — IPRATROPIUM BROMIDE 0.02 % IN SOLN
0.5000 mg | Freq: Once | RESPIRATORY_TRACT | Status: DC
  Filled 2012-07-31: qty 2.5

## 2012-07-31 MED ORDER — ALBUTEROL SULFATE (5 MG/ML) 0.5% IN NEBU
10.0000 mg | INHALATION_SOLUTION | Freq: Once | RESPIRATORY_TRACT | Status: AC
  Administered 2012-07-31: 10 mg via RESPIRATORY_TRACT
  Filled 2012-07-31: qty 2

## 2012-07-31 NOTE — ED Notes (Signed)
Patient c/o shortness of breath. Audible wheezing noted, asthma hx. Per patient ran out of albuterol inhaler 3 days ago. Patient states that the shortness of breath is worse in the morning.

## 2012-07-31 NOTE — ED Provider Notes (Signed)
History    CSN: [id number] Arrival date & time 07/31/12  1447  First MD Initiated Contact with Patient 07/31/12 1510     Chief Complaint  Patient presents with  . Asthma    HPI Pt was seen at 1510.   Per pt, c/o gradual onset and worsening of persistent cough, wheezing and SOB for the past 3 days.  Describes his symptoms as "my asthma is acting up."  States his symptoms began after he ran out of his MDI.  Denies CP/palpitations, no back pain, no abd pain, no N/V/D, no fevers, no rash.     Past Medical History  Diagnosis Date  . Arthritis   . Bipolar 1 disorder   . Seasonal allergies   . Asthma   . Schizophrenia, paranoid type   . Chronic back pain    History reviewed. No pertinent past surgical history.    History  Substance Use Topics  . Smoking status: Current Every Day Smoker -- 0.50 packs/day for 25 years    Types: Cigarettes  . Smokeless tobacco: Never Used  . Alcohol Use: No    Review of Systems ROS: Statement: All systems negative except as marked or noted in the HPI; Constitutional: Negative for fever and chills. ; ; Eyes: Negative for eye pain, redness and discharge. ; ; ENMT: Negative for ear pain, hoarseness, nasal congestion, sinus pressure and sore throat. ; ; Cardiovascular: Negative for chest pain, palpitations, diaphoresis, and peripheral edema. ; ; Respiratory: +cough, wheezing, SOB. Negative for stridor. ; ; Gastrointestinal: Negative for nausea, vomiting, diarrhea, abdominal pain, blood in stool, hematemesis, jaundice and rectal bleeding. . ; ; Genitourinary: Negative for dysuria, flank pain and hematuria. ; ; Musculoskeletal: Negative for back pain and neck pain. Negative for swelling and trauma.; ; Skin: Negative for pruritus, rash, abrasions, blisters, bruising and skin lesion.; ; Neuro: Negative for headache, lightheadedness and neck stiffness. Negative for weakness, altered level of consciousness , altered mental status, extremity weakness,  paresthesias, involuntary movement, seizure and syncope.     Allergies  Review of patient's allergies indicates no known allergies.  Home Medications   Current Outpatient Rx  Name  Route  Sig  Dispense  Refill  . albuterol (PROAIR HFA) 108 (90 BASE) MCG/ACT inhaler   Inhalation   Inhale 2 puffs into the lungs every 6 (six) hours as needed for wheezing or shortness of breath.         . benztropine (COGENTIN) 1 MG tablet   Oral   Take 1 mg by mouth daily.         . fish oil-omega-3 fatty acids 1000 MG capsule   Oral   Take 1 g by mouth every evening.         Marland Kitchen GARLIC PO   Oral   Take 1 tablet by mouth every evening.         Marland Kitchen MELATONIN PO   Oral   Take 1 tablet by mouth at bedtime as needed (FOR SLEEP).         . thiothixene (NAVANE) 5 MG capsule   Oral   Take 5 mg by mouth daily.         . vitamin C (ASCORBIC ACID) 500 MG tablet   Oral   Take 500 mg by mouth every evening.          BP 127/77  Pulse 72  Temp(Src) 97.7 F (36.5 C) (Oral)  Resp 26  Ht 5\' 9"  (1.753 m)  Wt 190  lb (86.183 kg)  BMI 28.05 kg/m2  SpO2 94%  Physical Exam 1515: Physical examination:  Nursing notes reviewed; Vital signs and O2 SAT reviewed;  Constitutional: Well developed, Well nourished, Well hydrated, In no acute distress; Head:  Normocephalic, atraumatic; Eyes: EOMI, PERRL, No scleral icterus; ENMT: Mouth and pharynx normal, Mucous membranes moist; Neck: Supple, Full range of motion, No lymphadenopathy; Cardiovascular: Regular rate and rhythm, No gallop; Respiratory: Breath sounds diminished & equal bilaterally, insp/exp wheezes bilat with faint audible wheezing. Speaking in full sentences. Normal respiratory effort/excursion; Chest: Nontender, Movement normal; Abdomen: Soft, Nontender, Nondistended, Normal bowel sounds; Genitourinary: No CVA tenderness; Extremities: Pulses normal, No tenderness, No edema, No calf edema or asymmetry.; Neuro: AA&Ox3, Major CN grossly intact.   Speech clear. No gross focal motor or sensory deficits in extremities.; Skin: Color normal, Warm, Dry.    ED Course  Procedures    MDM  MDM Reviewed: previous chart, nursing note and vitals Interpretation: x-ray   Dg Chest 2 View 07/31/2012   *RADIOLOGY REPORT*  Clinical Data: Asthma, short of breath  CHEST - 2 VIEW  Comparison: Prior chest x-ray 11/09/2009  Findings: Root where the wall of the lungs are hyperexpanded. There is central airway thickening and peribronchial cuffing.  No focal airspace consolidation.  No pneumothorax or pleural effusion. Cardiac and mediastinal contours are within normal limits.  No acute osseous abnormality.  IMPRESSION:  Nonspecific pulmonary hyperexpansion and diffuse central airway thickening/peribronchial cuffing as can be seen with both chronic inflammatory conditions such as asthma, and chronic bronchitis/COPD.   Original Report Authenticated By: Malachy Moan, M.D.     1625: Pt states he "feels better" after neb and steroid.  NAD, lungs CTA bilat, no wheezing, resps easy, speaking full sentences, Sats 97% R/A.  Pt ambulated around the ED with resps easy, NAD.  Wants to go home now. Dx and testing d/w pt.  Questions answered.  Verb understanding, agreeable to d/c home with outpt f/u.     Laray Anger, DO 08/02/12 1547

## 2012-09-20 ENCOUNTER — Emergency Department: Payer: Self-pay | Admitting: Emergency Medicine

## 2012-09-20 LAB — BASIC METABOLIC PANEL
BUN: 8 mg/dL (ref 7–18)
Calcium, Total: 9.5 mg/dL (ref 8.5–10.1)
Creatinine: 1.14 mg/dL (ref 0.60–1.30)
EGFR (African American): >60
Osmolality: 277 (ref 275–301)

## 2012-09-20 LAB — CBC
HCT: 44.1 % (ref 40.0–52.0)
MCH: 32.7 pg (ref 26.0–34.0)
MCHC: 35.4 g/dL (ref 32.0–36.0)
Platelet: 294 10*3/uL (ref 150–440)
RBC: 4.77 10*6/uL (ref 4.40–5.90)
RDW: 13.4 % (ref 11.5–14.5)

## 2012-09-20 LAB — CK TOTAL AND CKMB (NOT AT ARMC): CK-MB: 7.5 ng/mL — ABNORMAL HIGH (ref 0.5–3.6)

## 2012-09-21 LAB — CK TOTAL AND CKMB (NOT AT ARMC)
CK, Total: 448 U/L — ABNORMAL HIGH (ref 35–232)
CK-MB: 6.4 ng/mL — ABNORMAL HIGH (ref 0.5–3.6)

## 2013-02-26 ENCOUNTER — Emergency Department: Payer: Self-pay | Admitting: Emergency Medicine

## 2013-02-26 LAB — CBC
HCT: 45.7 % (ref 40.0–52.0)
HGB: 15.5 g/dL (ref 13.0–18.0)
MCH: 31.6 pg (ref 26.0–34.0)
MCHC: 34 g/dL (ref 32.0–36.0)
MCV: 93 fL (ref 80–100)
Platelet: 227 10*3/uL (ref 150–440)
RBC: 4.92 10*6/uL (ref 4.40–5.90)
RDW: 13.5 % (ref 11.5–14.5)
WBC: 7.7 10*3/uL (ref 3.8–10.6)

## 2013-02-27 ENCOUNTER — Encounter (HOSPITAL_COMMUNITY): Payer: Self-pay | Admitting: Emergency Medicine

## 2013-02-27 ENCOUNTER — Emergency Department (HOSPITAL_COMMUNITY)
Admission: EM | Admit: 2013-02-27 | Discharge: 2013-02-28 | Disposition: A | Discharge status: Other Acute Inpatient Hospital | Payer: MEDICAID | Attending: Emergency Medicine | Admitting: Emergency Medicine

## 2013-02-27 DIAGNOSIS — G8929 Other chronic pain: Secondary | ICD-10-CM | POA: Insufficient documentation

## 2013-02-27 DIAGNOSIS — R45851 Suicidal ideations: Secondary | ICD-10-CM | POA: Insufficient documentation

## 2013-02-27 DIAGNOSIS — J209 Acute bronchitis, unspecified: Secondary | ICD-10-CM

## 2013-02-27 DIAGNOSIS — F309 Manic episode, unspecified: Secondary | ICD-10-CM | POA: Insufficient documentation

## 2013-02-27 DIAGNOSIS — Z79899 Other long term (current) drug therapy: Secondary | ICD-10-CM | POA: Insufficient documentation

## 2013-02-27 DIAGNOSIS — J45909 Unspecified asthma, uncomplicated: Secondary | ICD-10-CM | POA: Insufficient documentation

## 2013-02-27 DIAGNOSIS — F17200 Nicotine dependence, unspecified, uncomplicated: Secondary | ICD-10-CM | POA: Insufficient documentation

## 2013-02-27 DIAGNOSIS — Z8739 Personal history of other diseases of the musculoskeletal system and connective tissue: Secondary | ICD-10-CM | POA: Insufficient documentation

## 2013-02-27 LAB — URINALYSIS, ROUTINE W REFLEX MICROSCOPIC
Bilirubin Urine: NEGATIVE
GLUCOSE, UA: NEGATIVE mg/dL
HGB URINE DIPSTICK: NEGATIVE
KETONES UR: NEGATIVE mg/dL
LEUKOCYTES UA: NEGATIVE
Nitrite: NEGATIVE
PH: 5.5 (ref 5.0–8.0)
Specific Gravity, Urine: 1.025 (ref 1.005–1.030)
Urobilinogen, UA: 1 mg/dL (ref 0.0–1.0)

## 2013-02-27 LAB — RAPID URINE DRUG SCREEN, HOSP PERFORMED
Amphetamines: NOT DETECTED
BARBITURATES: NOT DETECTED
BENZODIAZEPINES: NOT DETECTED
COCAINE: NOT DETECTED
OPIATES: NOT DETECTED
Tetrahydrocannabinol: POSITIVE — AB

## 2013-02-27 LAB — INFLUENZA PANEL BY PCR (TYPE A & B)
H1N1FLUPCR: NOT DETECTED
Influenza A By PCR: NEGATIVE
Influenza B By PCR: NEGATIVE

## 2013-02-27 LAB — CBC WITH DIFFERENTIAL/PLATELET
Basophils Absolute: 0.1 10*3/uL (ref 0.0–0.1)
Basophils Relative: 1 % (ref 0–1)
EOS ABS: 0 10*3/uL (ref 0.0–0.7)
EOS PCT: 0 % (ref 0–5)
HEMATOCRIT: 45.9 % (ref 39.0–52.0)
HEMOGLOBIN: 16.2 g/dL (ref 13.0–17.0)
LYMPHS ABS: 1.3 10*3/uL (ref 0.7–4.0)
LYMPHS PCT: 13 % (ref 12–46)
MCH: 32.5 pg (ref 26.0–34.0)
MCHC: 35.3 g/dL (ref 30.0–36.0)
MCV: 92 fL (ref 78.0–100.0)
MONOS PCT: 14 % — AB (ref 3–12)
Monocytes Absolute: 1.4 10*3/uL — ABNORMAL HIGH (ref 0.1–1.0)
Neutro Abs: 7.3 10*3/uL (ref 1.7–7.7)
Neutrophils Relative %: 73 % (ref 43–77)
Platelets: 249 10*3/uL (ref 150–400)
RBC: 4.99 MIL/uL (ref 4.22–5.81)
RDW: 13.2 % (ref 11.5–15.5)
WBC: 10 10*3/uL (ref 4.0–10.5)

## 2013-02-27 LAB — BASIC METABOLIC PANEL
BUN: 10 mg/dL (ref 6–23)
CHLORIDE: 99 meq/L (ref 96–112)
CO2: 23 meq/L (ref 19–32)
Calcium: 9.3 mg/dL (ref 8.4–10.5)
Creatinine, Ser: 1.02 mg/dL (ref 0.50–1.35)
GFR calc Af Amer: >90 mL/min (ref >90)
GFR calc non Af Amer: 90 mL/min — ABNORMAL LOW (ref >90)
GLUCOSE: 108 mg/dL — AB (ref 70–99)
POTASSIUM: 3.6 meq/L — AB (ref 3.7–5.3)
SODIUM: 137 meq/L (ref 137–147)

## 2013-02-27 LAB — ACETAMINOPHEN LEVEL: Acetaminophen (Tylenol), Serum: <15 ug/mL (ref 10–30)

## 2013-02-27 LAB — ETHANOL: Alcohol, Ethyl (B): <11 mg/dL (ref 0–11)

## 2013-02-27 LAB — SALICYLATE LEVEL

## 2013-02-27 MED ORDER — NICOTINE 21 MG/24HR TD PT24
21.0000 mg | MEDICATED_PATCH | Freq: Every day | TRANSDERMAL | Status: DC | PRN
Start: 2013-02-27 — End: 2013-02-28

## 2013-02-27 MED ORDER — SULFAMETHOXAZOLE-TMP DS 800-160 MG PO TABS
1.0000 | ORAL_TABLET | Freq: Two times a day (BID) | ORAL | Status: DC
  Administered 2013-02-27 – 2013-02-28 (×2): 1 via ORAL
  Filled 2013-02-27 (×2): qty 1

## 2013-02-27 MED ORDER — ZOLPIDEM TARTRATE 5 MG PO TABS: 5.0000 mg | ORAL_TABLET | Freq: Every evening | ORAL | Status: DC | PRN

## 2013-02-27 MED ORDER — ACETAMINOPHEN 325 MG PO TABS: 650.0000 mg | ORAL_TABLET | ORAL | Status: DC | PRN

## 2013-02-27 MED ORDER — BENZTROPINE MESYLATE 1 MG PO TABS
1.0000 mg | ORAL_TABLET | Freq: Every day | ORAL | Status: DC
  Administered 2013-02-27 – 2013-02-28 (×2): 1 mg via ORAL
  Filled 2013-02-27 (×2): qty 1

## 2013-02-27 MED ORDER — ALBUTEROL SULFATE HFA 108 (90 BASE) MCG/ACT IN AERS
2.0000 | INHALATION_SPRAY | Freq: Four times a day (QID) | RESPIRATORY_TRACT | Status: DC | PRN
  Administered 2013-02-28: 2 via RESPIRATORY_TRACT
  Filled 2013-02-27: qty 6.7

## 2013-02-27 MED ORDER — THIOTHIXENE 5 MG PO CAPS
5.0000 mg | ORAL_CAPSULE | Freq: Every day | ORAL | Status: DC
  Administered 2013-02-27 – 2013-02-28 (×2): 5 mg via ORAL
  Filled 2013-02-27 (×5): qty 1

## 2013-02-27 MED ORDER — PREDNISONE 10 MG PO TABS
60.0000 mg | ORAL_TABLET | Freq: Once | ORAL | Status: AC
  Administered 2013-02-27: 60 mg via ORAL
  Filled 2013-02-27 (×2): qty 1

## 2013-02-27 MED ORDER — IBUPROFEN 400 MG PO TABS: 400.0000 mg | ORAL_TABLET | Freq: Three times a day (TID) | ORAL | Status: DC | PRN

## 2013-02-27 MED ORDER — IPRATROPIUM BROMIDE 0.02 % IN SOLN
0.5000 mg | Freq: Once | RESPIRATORY_TRACT | Status: AC
  Administered 2013-02-27: 0.5 mg via RESPIRATORY_TRACT
  Filled 2013-02-27: qty 2.5

## 2013-02-27 MED ORDER — ALBUTEROL (5 MG/ML) CONTINUOUS INHALATION SOLN
15.0000 mg | INHALATION_SOLUTION | Freq: Once | RESPIRATORY_TRACT | Status: AC
  Administered 2013-02-27: 15 mg via RESPIRATORY_TRACT
  Filled 2013-02-27: qty 20

## 2013-02-27 MED ORDER — LORAZEPAM 1 MG PO TABS
1.0000 mg | ORAL_TABLET | Freq: Three times a day (TID) | ORAL | Status: DC | PRN
  Administered 2013-02-27: 1 mg via ORAL
  Filled 2013-02-27: qty 1

## 2013-02-27 NOTE — Treatment Plan (Signed)
[facility] by two extenders Janann August(Cori Burkett, NP and Alberteen SamFran Hobson, NP) who felt that pt's vital signs indicating a medical problem.  Pt's temperature of 100.3, respirations of 28, and oxygen saturation of 93% on RA is not considered medically cleared from a [facility] perspective.

## 2013-02-27 NOTE — ED Notes (Signed)
Pt reports has history of asthma.  Reports went to [facility] last night for asthma attack.  Pt reports someone gave him 3 pills for a headache yesterday.  Says afterwards had broken out in a sweat.  Pt says isn't sure what the medication was.  Pt says became very jittery and "emotionally unstable."  Pt says now is having SI.  Denies HI.

## 2013-02-27 NOTE — ED Notes (Signed)
Resp came to give breathing tx. Pt wishes to eat first. NAD. RT will be back at 1900.

## 2013-02-27 NOTE — BH Assessment (Signed)
TA scheduled for this patient at 11:30am. Contacted Dr. Alwyn PeaMc Manus for clinicals prior to seeing this patient. She was not familiar with patient. Writer reviewed nurses notes.

## 2013-02-27 NOTE — BH Assessment (Signed)
Pt ran by Renata Capriceonrad, NP for disposition recommendations. Renata CapriceConrad is requesting a UDS, UA, and EKG. No further disposition decisions will be made until those labs/test are completed. Writer has Stage managernotified nursing secretary of pending disposition recommendations.

## 2013-02-27 NOTE — ED Provider Notes (Signed)
CSN: [id number]     Arrival date & time 02/27/13  1030 History   First MD Initiated Contact with Patient 02/27/13 1048     Chief Complaint  Patient presents with  . V70.1    HPI Pt was seen at 1120. Per pt, c/o gradual onset and worsening of persistent "emotional instability" that began yesterday. States he "had a headache" and [facility]. Pt states the pills "made me feel jittery" and "get sweaty." Pt states he was evaluated at [facility] ED for same, told "the pills were probably motrin" and "got discharged." States he "hasn't slept in at least 2 days," "can't finish a thought," and "feel like I'm going to cry." Endorses vague SI. States he has had several previous psychiatric admissions for bipolar and schizophrenia.  Denies SA, no HI, no A/V hallucinations.    Past Medical History  Diagnosis Date  . Arthritis   . Bipolar 1 disorder   . Seasonal allergies   . Asthma   . Schizophrenia, paranoid type   . Chronic back pain    History reviewed. No pertinent past surgical history.    Social History Main Topics  . Smoking status: Current Every Day Smoker -- 0.50 packs/day for 25 years    Types: Cigarettes  . Smokeless tobacco: Never Used  . Alcohol Use: No  . Drug Use: Yes    Special: Cocaine, Marijuana     Comment: last used marijuana dec 30     Review of Systems ROS: Statement: All systems negative except as marked or noted in the HPI; Constitutional: Negative for fever and chills. ; ; Eyes: Negative for eye pain, redness and discharge. ; ; ENMT: Negative for ear pain, hoarseness, nasal congestion, sinus pressure and sore throat. ; ; Cardiovascular: Negative for chest pain, palpitations, diaphoresis, dyspnea and peripheral edema. ; ; Respiratory: Negative for cough, wheezing and stridor. ; ; Gastrointestinal: Negative for nausea, vomiting, diarrhea, abdominal pain, blood in stool, hematemesis, jaundice and rectal bleeding. . ;  ; Genitourinary: Negative for dysuria, flank pain and hematuria. ; ; Musculoskeletal: Negative for back pain and neck pain. Negative for swelling and trauma.; ; Skin: Negative for pruritus, rash, abrasions, blisters, bruising and skin lesion.; ; Neuro: Negative for headache, lightheadedness and neck stiffness. Negative for weakness, altered level of consciousness , altered mental status, extremity weakness, paresthesias, involuntary movement, seizure and syncope.; Psych:  +labile, +SI. No SA, no HI, no hallucinations.    Allergies  Review of patient's allergies indicates no known allergies.  Home Medications   Current Outpatient Rx  Name  Route  Sig  Dispense  Refill  . albuterol (PROAIR HFA) 108 (90 BASE) MCG/ACT inhaler   Inhalation   Inhale 2 puffs into the lungs every 6 (six) hours as needed for wheezing or shortness of breath.         . BEE POLLEN PO   Oral   Take 1 tablet by mouth 2 (two) times daily.         . benztropine (COGENTIN) 1 MG tablet   Oral   Take 1 mg by mouth daily.         Marland Kitchen. BREWERS YEAST PO   Oral   Take 1 tablet by mouth 2 (two) times daily.         Marland Kitchen. CINNAMON PO   Oral   Take 1 capsule by mouth 2 (two) times daily.         .Marland Kitchen  Cyanocobalamin (VITAMIN B-12) 500 MCG SUBL   Sublingual   Place 1 tablet under the tongue 2 (two) times daily.         Marland Kitchen MELATONIN PO   Oral   Take 1 tablet by mouth at bedtime as needed (FOR SLEEP).         Marland Kitchen tetrahydrozoline-zinc (VISINE-AC) 0.05-0.25 % ophthalmic solution   Both Eyes   Place 1 drop into both eyes 2 (two) times daily as needed (itching).         . thiothixene (NAVANE) 5 MG capsule   Oral   Take 5 mg by mouth daily.          BP 131/81  Pulse 89  Temp(Src) 99.2 F (37.3 C) (Oral)  Resp 16  SpO2 93% Physical Exam 1125: Physical examination:  Nursing notes reviewed; Vital signs and O2 SAT reviewed;  Constitutional: Well developed, Well nourished, Well hydrated, In no acute distress;  Head:  Normocephalic, atraumatic; Eyes: EOMI, PERRL, No scleral icterus; ENMT: Mouth and pharynx normal, Mucous membranes moist; Neck: Supple, Full range of motion, No lymphadenopathy; Cardiovascular: Regular rate and rhythm, No gallop; Respiratory: Breath sounds clear & equal bilaterally, No wheezes.  Speaking full sentences with ease, Normal respiratory effort/excursion; Chest: Nontender, Movement normal; Abdomen: Soft, Nontender, Nondistended, Normal bowel sounds; Genitourinary: No CVA tenderness; Extremities: Pulses normal, No tenderness, No edema, No calf edema or asymmetry.; Neuro: AA&Ox3, rambling historian. Major CN grossly intact.  Speech clear. No gross focal motor or sensory deficits in extremities.; Skin: Color normal, Warm, Dry.; Psych:  Labile, easily agitated then tearful, tangential, rapid/pressured speech.    ED Course  Procedures    EKG Interpretation    Date/Time:  Thursday February 27 2013 14:05:26 EST Ventricular Rate:  105 PR Interval:  132 QRS Duration: 86 QT Interval:  336 QTC Calculation: 444 R Axis:   73 Text Interpretation:  Sinus tachycardia Otherwise normal ECG No previous ECGs available Confirmed by COOK  MD, BRIAN ([id number]) on 02/27/2013 2:50:05 PM            MDM  MDM Reviewed: previous chart, nursing note and vitals Interpretation: labs and ECG     Results for orders placed during the hospital encounter of 02/27/13  ETHANOL      Result Value Range   Alcohol, Ethyl (B) <11  0 - 11 mg/dL  ACETAMINOPHEN LEVEL      Result Value Range   Acetaminophen (Tylenol), Serum <15.0  10 - 30 ug/mL  SALICYLATE LEVEL      Result Value Range   Salicylate Lvl <2.0 (*) 2.8 - 20.0 mg/dL  BASIC METABOLIC PANEL      Result Value Range   Sodium 137  137 - 147 mEq/L   Potassium 3.6 (*) 3.7 - 5.3 mEq/L   Chloride 99  96 - 112 mEq/L   CO2 23  19 - 32 mEq/L   Glucose, Bld 108 (*) 70 - 99 mg/dL   BUN 10  6 - 23 mg/dL   Creatinine, Ser 8.11  0.50 - 1.35 mg/dL   Calcium  9.3  8.4 - 91.4 mg/dL   GFR calc non Af Amer 90 (*) >90 mL/min   GFR calc Af Amer >90  >90 mL/min  CBC WITH DIFFERENTIAL      Result Value Range   WBC 10.0  4.0 - 10.5 K/uL   RBC 4.99  4.22 - 5.81 MIL/uL   Hemoglobin 16.2  13.0 - 17.0 g/dL   HCT 78.2  95.6 -  52.0 %   MCV 92.0  78.0 - 100.0 fL   MCH 32.5  26.0 - 34.0 pg   MCHC 35.3  30.0 - 36.0 g/dL   RDW 16.1  09.6 - 04.5 %   Platelets 249  150 - 400 K/uL   Neutrophils Relative % 73  43 - 77 %   Neutro Abs 7.3  1.7 - 7.7 K/uL   Lymphocytes Relative 13  12 - 46 %   Lymphs Abs 1.3  0.7 - 4.0 K/uL   Monocytes Relative 14 (*) 3 - 12 %   Monocytes Absolute 1.4 (*) 0.1 - 1.0 K/uL   Eosinophils Relative 0  0 - 5 %   Eosinophils Absolute 0.0  0.0 - 0.7 K/uL   Basophils Relative 1  0 - 1 %   Basophils Absolute 0.1  0.0 - 0.1 K/uL     1200:  [place] ED records received: Pt was evaluated for cough, congestion and wheezing "since last pm," had negative CXR and discharged. There was no mention in the records of "taking pills" or "feeling jittery," no diaphoresis noted on exam, and no notation of any medications or prescriptions received while in the ED.   1530:  UDS/UA pending. TSS has evaluated pt: state no further disposition decisions will be made due to pending urine results.     Laray Anger, DO 02/27/13 [phone]

## 2013-02-27 NOTE — ED Notes (Signed)
Pt concerned with stopping prednisone.  And wants his antibiodic.  Spoke with dr. Lynelle DoctorKnapp.  Okay to stop prednisone,  Since pt got worse after taking 3 pills at [facility] last night.  Calling and getting name of ax.  Also pt wheezing some.

## 2013-02-27 NOTE — ED Notes (Signed)
Notified dr. Lynelle DoctorKnapp of temp 100.3 and pt wheezing.

## 2013-02-27 NOTE — ED Notes (Signed)
Pt resting w/ eyes closed, rise & fall of chest noted. Sitter remains w/ pt in sight. Bed in low position, side rails up x2. NAD noted,

## 2013-02-27 NOTE — BH Assessment (Signed)
Tele Assessment Note   Wesley Beck is an 43 y.o. male with history of Bipolar I Disorder and Schizophrenia, Paranoid type. Sts that he called [facility] this am requesting help as he was feeling "jittery and emotionally unstable". [facility] in return called EMS to assist patient. Pt was brought to [facility] ED for medical clearance. Pt explains that he has a history of asthma. Reports he went to [facility] last night for a asthma attack. Pt says he was given 3 pills but unsure of what the pills were. Says that he eventually broke out in a sweat after taking these unk pills. Since then he has also become "jittery and unstable" as he reported to [facility] staff.  Patient reported to [facility] staff that he was suicidal. Writer inquired more about patient's suicidal thoughts and he initially would not answer any further questions or provide details.  Writer asked patient if he was suicidal once again and he sts, "No, but I need help and I was told if I say I'm suicidal I will get the help I need". Furthermore, patient sts that he is not suicidal but needs housing preferably in another state. [facility]. He has a history of one prior overdose.   Patient appears out of touch with reality at times during this assessment. He has flight of ideas and does not answer most of the questions appropriately. He shows flight of ideas. His mood is labile. He is cooperative but appears extremely anxious and almost manic at times. Pt displays paranoia regarding towards family members potentially mistreating him .  Patient denies HI and AVH's. No current alcohol and/or drug use. However, he has a history of alcohol/thc use.   Axis I: Bipolar, mixed Axis II: Deferred Axis III:  Past Medical History  Diagnosis Date  . Arthritis   . Bipolar 1 disorder   . Seasonal allergies   . Asthma   . Schizophrenia, paranoid type   . Chronic back pain    Axis IV: other  psychosocial or environmental problems, problems related to social environment, problems with access to health care services and problems with primary support group Axis V: 31-40 impairment in reality testing  Past Medical History:  Past Medical History  Diagnosis Date  . Arthritis   . Bipolar 1 disorder   . Seasonal allergies   . Asthma   . Schizophrenia, paranoid type   . Chronic back pain     History reviewed. No pertinent past surgical history.  Family History: No family history on file.  Social History:  reports that he has been smoking Cigarettes.  He has a 12.5 pack-year smoking history. He has never used smokeless tobacco. He reports that he uses illicit drugs (Cocaine and Marijuana). He reports that he does not drink alcohol.  Additional Social History:  Alcohol / Drug Use Pain Medications: SEE MAR Prescriptions: SEE MAR Over the Counter: SEE MAR History of alcohol / drug use?: Yes Substance #1 Name of Substance 1: Alcohol  1 - Age of First Use: 43 yrs old  1 - Amount (size/oz): patient did not answer question  1 - Frequency: occsional use; reports seldom using alcohol; no further information provided  1 - Duration: ongoing since age 8 1 - Last Use / Amount: patient unable to recall  Substance #2 Name of Substance 2: Marijuana 2 - Age of First Use: 43 yrs old  2 - Amount (size/oz): varies 2 - Frequency:  patient unable to provide details on frequency 2 - Duration: on-going  2 - Last Use / Amount: February 04, 2013  CIWA: CIWA-Ar BP: 131/81 mmHg Pulse Rate: 89 COWS:    Allergies: No Known Allergies  Home Medications:  (Not in a hospital admission)  OB/GYN Status:  No LMP for male patient.  General Assessment Data Location of Assessment: [facility] Is this a Tele or Face-to-Face Assessment?: Tele Assessment Is this an Initial Assessment or a Re-assessment for this encounter?: Initial Assessment Living Arrangements: Other (Comment) (homless[facility] ) Can pt return to current living arrangement?:  (unk) Admission Status: Voluntary Is patient capable of signing voluntary admission?: Yes Transfer from: Acute Hospital Referral Source: Self/Family/Friend  Medical Screening Exam [facility] Walk-in ONLY) Medical Exam completed: Yes  [facility] Crisis Care Plan Living Arrangements: Other (Comment) (homless; [facility] for the past 3 weeks ) Name of Psychiatrist:  (No psychiarist; previously receiving treatment at [facility]) Name of Therapist:  (No current therapist )  Education Status Is patient currently in school?: No  Risk to self Suicidal Ideation: No (after asking 3 times patient sts no suicidal ideations ) Suicidal Intent:  (No,he was told to say he is suicidal or he wouldn't get help) Is patient at risk for suicide?: Yes (patient told nursing staff he was suicidal; later denies ) Suicidal Plan?: No Access to Means: Yes Specify Access to Suicidal Means:  (prescription medications) What has been your use of drugs/alcohol within the last 12 months?:  (patient reports occas. use of thc and alcohol ) Previous Attempts/Gestures: Yes How many times?:  (1x pt reports overdosing stating it was several yrs ago ) Other Self Harm Risks:  (none reported ) Triggers for Past Attempts: Other (Comment) (patient would not provide a explanation) Intentional Self Injurious Behavior: None Family Suicide History: Unknown Recent stressful life event(s): Other (Comment) (homeless requesting assistance with living arrrangements) Persecutory voices/beliefs?: No Depression: No Depression Symptoms: Feeling angry/irritable ("I don't believe in depression I believe in blessings") Substance abuse history and/or treatment for substance abuse?: No Suicide prevention information given to non-admitted patients: Not applicable  Risk to Others Homicidal Ideation: No Thoughts of Harm to Others: No Current Homicidal Intent:  No Current Homicidal Plan: No Access to Homicidal Means: No Identified Victim:  (n/a) History of harm to others?: No Assessment of Violence: None Noted Violent Behavior Description:  (anxious, guarded, thought blocking, etc. ) Does patient have access to weapons?: No Criminal Charges Pending?: No Does patient have a court date: No  Psychosis Hallucinations: None noted Delusions: None noted  Mental Status Report Appear/Hygiene: Disheveled Eye Contact: Fair Motor Activity: Freedom of movement Speech: Logical/coherent Level of Consciousness: Alert Mood: Depressed Affect: Appropriate to circumstance Anxiety Level: None Thought Processes: Coherent;Relevant Judgement: Impaired Orientation: Person;Place;Time;Situation Obsessive Compulsive Thoughts/Behaviors: None  Cognitive Functioning Concentration: Decreased Memory: Recent Intact;Remote Intact IQ: Average Insight: Poor Impulse Control: Fair Appetite: Fair Weight Loss:  (none reported ) Weight Gain:  (none reported ) Sleep: Decreased ("I haven't slept well in the past several days") Total Hours of Sleep:  (varies ) Vegetative Symptoms: None  ADLScreening [facility] Assessment Services) Patient's cognitive ability adequate to safely complete daily activities?: Yes Patient able to express need for assistance with ADLs?: Yes Independently performs ADLs?: Yes (appropriate for developmental age)  Prior Inpatient Therapy Prior Inpatient Therapy: Yes Prior Therapy Dates:  (2x's at [facility]) Reason for Treatment:  (psychosis and substance abuse)  Prior Outpatient Therapy  Prior Outpatient Therapy: No Prior Therapy Dates:  (n/a) Prior Therapy Facilty/Provider(s):  (n/a) Reason for Treatment:  (n/a)  ADL Screening (condition at time of admission) Patient's cognitive ability adequate to safely complete daily activities?: Yes Is the patient deaf or have difficulty hearing?: No Does the patient have difficulty seeing, even when wearing  glasses/contacts?: No Does the patient have difficulty concentrating, remembering, or making decisions?: No Patient able to express need for assistance with ADLs?: Yes Does the patient have difficulty dressing or bathing?: No Independently performs ADLs?: Yes (appropriate for developmental age) Does the patient have difficulty walking or climbing stairs?: No Weakness of Legs: None Weakness of Arms/Hands: None  Home Assistive Devices/Equipment Home Assistive Devices/Equipment: None    Abuse/Neglect Assessment (Assessment to be complete while patient is alone) Physical Abuse: Denies Verbal Abuse: Denies Sexual Abuse: Denies Exploitation of patient/patient's resources: Denies Self-Neglect: Denies Values / Beliefs Cultural Requests During Hospitalization: None   Advance Directives (For Healthcare) Advance Directive: Patient does not have advance directive Nutrition Screen- [facility] Adult/[facility] Patient's home diet: Regular  Additional Information 1:1 In Past 12 Months?: No CIRT Risk: No Elopement Risk: No Does patient have medical clearance?: Yes     Disposition:  Disposition Initial Assessment Completed for this Encounter: Yes Disposition of Patient: Inpatient treatment program (Disposition pending UA, EKG, and UDS. ) Type of inpatient treatment program: Adult  Octaviano Battyerry, Katherina Wimer Mona 02/27/2013 12:40 PM

## 2013-02-27 NOTE — ED Provider Notes (Signed)
I was asked to see the patient by nursing staff who report he is becoming short of breath with wheezing and has a low-grade fever.  Pt states he is having more SOB than usual and has been coughing and wheezing.  He states "I go from hot flashes to cold chills."   Pt has been using inhalers for several years.  He is a smoker and is trying to quit.    Pt was initially seen today for "emotional instability" that began yesterday.  He reports he developed a headache yesterday morning and asked the director of [facility] for aspirin.  He states "I trusted him, he gave me 6 orange pills and told me to take 3 of them now and 3 of them at lunch."  Pt states he was diagnosed with schizophrenia around 12 years ago and has taken many psychiatric medications.  He states he has been taking his medications as prescribed.    On exam he has diffuse high-pitched end expiratory wheezing in all lung fields.   Medications  albuterol (PROVENTIL HFA;VENTOLIN HFA) 108 (90 BASE) MCG/ACT inhaler 2 puff (not administered)  benztropine (COGENTIN) tablet 1 mg (1 mg Oral Given 02/27/13 1620)  thiothixene (NAVANE) capsule 5 mg (5 mg Oral Given 02/27/13 1620)  LORazepam (ATIVAN) tablet 1 mg (1 mg Oral Given 02/27/13 1738)  acetaminophen (TYLENOL) tablet 650 mg (not administered)  ibuprofen (ADVIL,MOTRIN) tablet 400 mg (not administered)  zolpidem (AMBIEN) tablet 5 mg (not administered)  nicotine (NICODERM CQ - dosed in mg/24 hours) patch 21 mg (not administered)  albuterol (PROVENTIL,VENTOLIN) solution continuous neb (not administered)  ipratropium (ATROVENT) nebulizer solution 0.5 mg (not administered)  predniSONE (DELTASONE) tablet 60 mg (not administered)   I reviewed [facility] and found his CXR done at [facility] yesterday. Patient had 1 view chest x-ray done. Comparison was from 09/20/2012. Findings were cardiomediastinal silhouette is stable. No acute infiltrate or pleural effusion. No pulmonary edema.  Central mild bronchitic changes. Impression no acute infiltrate or pulmonary edema. Central mild bronchitic changes. This was read by Dr. Ruffin FrederickPop.  7:38 PM-On recheck pt has improved air movement.  He now has lower-pitched rhonchi.  He states he is feeling better.  His continuous nebulizer is not yet finished.   Patient started on steroids due to his bronchospasm. It appears his schizophrenia was getting out of control before he was started on the steroids. Patient seems very paranoid with the people who ran his shelter.  Devoria AlbeIva Jaleisa Brose, MD, Armando GangFACEP   Ward GivensIva L Reneshia Zuccaro, MD 02/28/13 [phone]

## 2013-02-27 NOTE — ED Notes (Signed)
Took pt's pro air inhaler and put it with other belongings.

## 2013-02-27 NOTE — ED Notes (Signed)
[facility]c notified of temp and wheezing

## 2013-02-28 ENCOUNTER — Inpatient Hospital Stay (HOSPITAL_COMMUNITY)
Admission: AD | Admit: 2013-02-28 | Discharge: 2013-03-03 | DRG: 885 | Disposition: A | Discharge status: Home / Self Care | Payer: MEDICAID | Source: Intra-hospital | Attending: Psychiatry | Admitting: Psychiatry

## 2013-02-28 ENCOUNTER — Encounter (HOSPITAL_COMMUNITY): Payer: Self-pay | Admitting: *Deleted

## 2013-02-28 DIAGNOSIS — F200 Paranoid schizophrenia: Secondary | ICD-10-CM | POA: Diagnosis present

## 2013-02-28 DIAGNOSIS — F3160 Bipolar disorder, current episode mixed, unspecified: Principal | ICD-10-CM | POA: Diagnosis present

## 2013-02-28 DIAGNOSIS — M549 Dorsalgia, unspecified: Secondary | ICD-10-CM | POA: Diagnosis present

## 2013-02-28 DIAGNOSIS — G8929 Other chronic pain: Secondary | ICD-10-CM | POA: Diagnosis present

## 2013-02-28 DIAGNOSIS — Z79899 Other long term (current) drug therapy: Secondary | ICD-10-CM

## 2013-02-28 DIAGNOSIS — F319 Bipolar disorder, unspecified: Secondary | ICD-10-CM

## 2013-02-28 MED ORDER — THIOTHIXENE 5 MG PO CAPS
5.0000 mg | ORAL_CAPSULE | Freq: Every day | ORAL | Status: DC
  Filled 2013-02-28 (×3): qty 1

## 2013-02-28 MED ORDER — IPRATROPIUM-ALBUTEROL 0.5-2.5 (3) MG/3ML IN SOLN
3.0000 mL | Freq: Four times a day (QID) | RESPIRATORY_TRACT | Status: DC
  Administered 2013-02-28 (×3): 3 mL via RESPIRATORY_TRACT
  Filled 2013-02-28 (×3): qty 3

## 2013-02-28 MED ORDER — BENZTROPINE MESYLATE 1 MG PO TABS
1.0000 mg | ORAL_TABLET | Freq: Every day | ORAL | Status: DC
  Administered 2013-03-01 – 2013-03-02 (×2): 1 mg via ORAL
  Filled 2013-02-28 (×2): qty 1
  Filled 2013-02-28: qty 14
  Filled 2013-02-28: qty 1

## 2013-02-28 MED ORDER — DIPHENHYDRAMINE HCL 25 MG PO CAPS
25.0000 mg | ORAL_CAPSULE | Freq: Every evening | ORAL | Status: DC | PRN
  Administered 2013-03-01: 25 mg via ORAL
  Filled 2013-02-28: qty 1

## 2013-02-28 MED ORDER — SULFAMETHOXAZOLE-TMP DS 800-160 MG PO TABS
1.0000 | ORAL_TABLET | Freq: Two times a day (BID) | ORAL | Status: DC
  Filled 2013-02-28 (×2): qty 1

## 2013-02-28 MED ORDER — ALBUTEROL SULFATE HFA 108 (90 BASE) MCG/ACT IN AERS
2.0000 | INHALATION_SPRAY | Freq: Four times a day (QID) | RESPIRATORY_TRACT | Status: DC | PRN
  Administered 2013-03-01 – 2013-03-03 (×4): 2 via RESPIRATORY_TRACT
  Filled 2013-02-28: qty 13.4

## 2013-02-28 MED ORDER — ALUM & MAG HYDROXIDE-SIMETH 200-200-20 MG/5ML PO SUSP: 30.0000 mL | ORAL | Status: DC | PRN

## 2013-02-28 MED ORDER — MAGNESIUM HYDROXIDE 400 MG/5ML PO SUSP: 30.0000 mL | Freq: Every day | ORAL | Status: DC | PRN

## 2013-02-28 MED ORDER — ACETAMINOPHEN 325 MG PO TABS: 650.0000 mg | ORAL_TABLET | Freq: Four times a day (QID) | ORAL | Status: DC | PRN

## 2013-02-28 MED ORDER — SULFAMETHOXAZOLE-TMP DS 800-160 MG PO TABS
1.0000 | ORAL_TABLET | Freq: Two times a day (BID) | ORAL | Status: DC
  Administered 2013-03-01 – 2013-03-03 (×5): 1 via ORAL
  Filled 2013-02-28 (×10): qty 1

## 2013-02-28 NOTE — Progress Notes (Signed)
Patient ID: Wesley Beck, male   DOB: 05-Dec-1970, 43 y.o.   MRN: [id number] Received report from admit RN Brittney. Patient reports '' I take bipolar medications, but really what happened is kind of odd, I was feeling bad and I asked for some aspirins and after I got them I started feeling really bad, I don't know if the director where I stay gave me the right stuff or not , [facility], anyway I went to the ER and they gave me breathing treatments and stuff but I started feeling like I was going to have a nervous breakdown. Now I'm kicked out of [facility] because I gave the guy a doctors note and he wouldn't honor it so I got mouthy with him. '' Patient currently denies any SI/HI /A/V Hallucinations. Support and encouragement provided. No further voiced concerns at this time. Will continue to monitor q 15 minutes for safety.

## 2013-02-28 NOTE — ED Notes (Signed)
[facility] Pt [facility] room [facility]. 402-2. Pt aware and verbalized understanding.

## 2013-02-28 NOTE — Tx Team (Signed)
Initial Interdisciplinary Treatment Plan  PATIENT STRENGTHS: (choose at least two) Average or above average intelligence Capable of independent living Physical Health  PATIENT STRESSORS: Traumatic event   PROBLEM LIST: Problem List/Patient Goals Date to be addressed Date deferred Reason deferred Estimated date of resolution  Suicidal thoughts 02/28/2013     depression 02/28/2013                                                DISCHARGE CRITERIA:  Improved stabilization in mood, thinking, and/or behavior Need for constant or close observation no longer present  PRELIMINARY DISCHARGE PLAN: Outpatient therapy Return to previous living arrangement  PATIENT/FAMIILY INVOLVEMENT: This treatment plan has been presented to and reviewed with the patient, Hali MarryWilliam J Grisanti.  The patient and family have been given the opportunity to ask questions and make suggestions.  Leighton Parodyyson, Rollin Kotowski M 02/28/2013, 7:10 PM

## 2013-02-28 NOTE — ED Notes (Signed)
[facility] service en route to get pt.

## 2013-02-28 NOTE — ED Provider Notes (Signed)
[facility] Will transfer stable.   Laray AngerKathleen M Almendra Loria, DO 02/28/13 1545

## 2013-02-28 NOTE — Progress Notes (Signed)
Patient ID: Wesley Beck, male   DOB: 03/26/1970, 43 y.o.   MRN: [id number] D. The patient spent the evening resting in bed with eyes closed.  A. Attempt made to awaken patient for group. R. The patient woke briefly to say that he needed to catch up with his much needed sleep. Did not attend evening group.

## 2013-02-28 NOTE — BH Assessment (Signed)
[facility] Nanine MeansJamison Lord, NP. Attending MD Akintayo, assigned bed #402-2.

## 2013-02-28 NOTE — Progress Notes (Signed)
Psychoeducational Group Note  Date:  02/28/2013 Time:  8:00 pm  Group Topic/Focus:  Wrap-Up Group:   The focus of this group is to help patients review their daily goal of treatment and discuss progress on daily workbooks.  Participation Level: Did Not Attend  Participation Quality:  Not Applicable  Affect:  Not Applicable  Cognitive:  Not Applicable  Insight:  Not Applicable  Engagement in Group: Not Applicable  Additional Comments: The patient did not attend group since he was asleep   Kaylanni Ezelle S 02/28/2013, 10:41 PM

## 2013-02-28 NOTE — ED Notes (Signed)
Security took pt to shower.

## 2013-02-28 NOTE — Progress Notes (Signed)
Patient came reporting that he is emotionally unstable after getting some unknown pills from another hosptial when he presented with an asthma attack; patient denies SI/HI and A/V hallucinations; patient has a history of bipolar and schizophrenia per report;   Report given to Express ScriptsLindsey RN

## 2013-03-01 DIAGNOSIS — F3160 Bipolar disorder, current episode mixed, unspecified: Principal | ICD-10-CM

## 2013-03-01 MED ORDER — METHYLPREDNISOLONE SODIUM SUCC 125 MG IJ SOLR
125.0000 mg | Freq: Once | INTRAMUSCULAR | Status: AC
  Administered 2013-03-01: 125 mg via INTRAMUSCULAR
  Filled 2013-03-01 (×2): qty 2

## 2013-03-01 MED ORDER — THIOTHIXENE 5 MG PO CAPS
10.0000 mg | ORAL_CAPSULE | Freq: Every day | ORAL | Status: DC
  Administered 2013-03-01 – 2013-03-02 (×2): 10 mg via ORAL
  Filled 2013-03-01 (×3): qty 1
  Filled 2013-03-01: qty 28

## 2013-03-01 MED ORDER — THIOTHIXENE 5 MG PO CAPS
5.0000 mg | ORAL_CAPSULE | Freq: Every day | ORAL | Status: DC
  Filled 2013-03-01: qty 1

## 2013-03-01 MED ORDER — IPRATROPIUM BROMIDE 0.02 % IN SOLN
0.5000 mg | Freq: Once | RESPIRATORY_TRACT | Status: AC
  Administered 2013-03-01: 0.5 mg via RESPIRATORY_TRACT
  Filled 2013-03-01: qty 2.5

## 2013-03-01 MED ORDER — ALBUTEROL SULFATE (2.5 MG/3ML) 0.083% IN NEBU
5.0000 mg | INHALATION_SOLUTION | Freq: Once | RESPIRATORY_TRACT | Status: AC
  Administered 2013-03-01: 5 mg via RESPIRATORY_TRACT
  Filled 2013-03-01 (×2): qty 6

## 2013-03-01 NOTE — Progress Notes (Signed)
Patient ID: Wesley Beck, male   DOB: 1970-07-06, 43 y.o.   MRN: [id number]  Around 0030 the patient requested his Inhaler due to tightness in his chest. He was having difficulty sleeping due to his roommate frequently getting out of bed and rummaging through his clothing bags. Medicated with 2 puffs of his Albuterol Inhaler. [name] made several attempts to return to sleep but was constantly disturbed by his roommate. He returned to the medication window asking to use his Inhaler again. He appeared to be hyperventilating. A bilateral wheeze was noted. O2 sat rate was 93. Afebrile with a temp of 98.3. The NP on call was notified and came to examine him. Nebulizer treatment ordered.

## 2013-03-01 NOTE — Progress Notes (Signed)
Patient ID: Wesley Beck, male   DOB: [birth year], 43 y.o.   MRN: [id number] D. Patient presents with depressed mood, anxious affect. Patient reports '' I didn't sleep at all last night I was coughing. And I still feel a little jittery, but I can't tolerate sleeping with someone at night with this cough, I would cough and keep my room mate up and then he would get up and I'd wake up '' Patient has been cooperative . Pt denies any AH/VH at this time. Pt denies any SI . A. Medications given as ordered. Support and encouragement provided. Discussed above information with MD. R. Patient remains calm and cooperative on the unit. No further voiced concerns at this time. Will continue to monitor q 15 minutes for safety.
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
## 2013-03-01 NOTE — [facility] Group Notes (Signed)
[facility] LCSW Group Therapy Note  03/01/2013 1:15  To 2:15 PM  Type of Therapy and Topic:  Group Therapy: Avoiding Self-Sabotaging and Enabling Behaviors  Participation Level:  Active   Mood: Flat  Description of Group:     Learn how to identify obstacles, self-sabotaging and enabling behaviors, what are they, why do we do them and what needs do these behaviors meet? Discuss unhealthy relationships and how to have positive healthy boundaries with those that sabotage and enable. Explore aspects of self-sabotage and enabling in yourself and how to limit these self-destructive behaviors in everyday life.  Therapeutic Goals: 1. Patient will identify one obstacle that relates to self-sabotage and enabling behaviors 2. Patient will identify one personal self-sabotaging or enabling behavior they did prior to admission 3. Patient able to establish a plan to change the above identified behavior they did prior to admission:  4. Patient will demonstrate ability to communicate their needs through discussion and/or role plays.   Summary of Patient Progress: The main focus of today's process group was to explain what "self-sabotage" means and use Motivational Interviewing to discuss what benefits, negative or positive, were involved in a self-identified self-sabotaging behavior. We then talked about reasons the patient may want to change the behavior and his current desire to change. Patient shared that others incompetence lead to his admit.  Patient also admits that Substance Abuse and Procrastination have been his worst self sabotagers. Patient was in and out of group room at multiple points.   Therapeutic Modalities:   Cognitive Behavioral Therapy Person-Centered Therapy Motivational Interviewing   [name], LCSW

## 2013-03-01 NOTE — Progress Notes (Signed)
Patient ID: Wesley Beck, male   DOB: [birth year], 43 y.o.   MRN: [id number] Psychoeducational Group Note  Date:  03/01/2013 Time:1000am  Group Topic/Focus:  Identifying Needs:   The focus of this group is to help patients identify their personal needs that have been historically problematic and identify healthy behaviors to address their needs.  Participation Level:  Did Not Attend  Participation Quality:    Affect: Cognitive: Insight: Engagement in Group:  Additional Comments:  Programming on the 500 hall   Valente DavidWeaver, Naimah Yingst Brooks 03/01/2013,9:42 AM

## 2013-03-01 NOTE — Progress Notes (Signed)
Patient ID: Wesley Beck, male   DOB: November 01, 1970, 43 y.o.   MRN: [id number]  Patient was seen for difficulty breathing and increased shortness of breath. Patient states that "I have problems breathing when I don't get enough sleep." Patient states that he has only been getting 4-5 hours of sleep at night.  Patient had similar episode of shortness of breath on 02/27/13 [facility]. The patient was screened for flu during 02/27/13 visit to [facility] and had negative result. There is no chest xray result in EPIC for this patient's 02/27/13 visit to [facility], however, Dr. Devoria AlbeIva Knapp, ED physician who saw the patient on 02/27/13 documented that [facility] and [facility] on [facility]. Patient had 1 view chest x-ray done. Comparison was from 09/20/2012. Findings were cardiomediastinal silhouette is stable. No acute infiltrate or pleural effusion. No pulmonary edema. Central mild bronchitic changes. Impression no acute infiltrate or pulmonary edema. Central mild bronchitic changes. This was read by Dr. Ruffin FrederickPop.  Vital signs: T 98.3, BP 107/69, HR 93, RR 26, Room air O2 sat 92-93%.   Constitutional: The patient appears anxious with labored work of breathing. Head: Normocephalic, atraumatic Eyes: Pupils PERRL. ENT: Nares patent. No nasal discharge. Neck: Trachea midline, full range of motion. Cardiovascular: Regular rate and rhythm with a normal S1 and S2. No gallops, rubs or murmurs.  Respiratory: Coarse inspiratory and expiratory wheezes auscultated all posterior lobes. Increased work of breathing and mild retractions noted.  Abdomen/GI: Soft, non-tender. Back: No spinal tenderness. No CVA tenderness.  Skin: Warm and dry with normal turgor.  Constitutional: The patient appears Neuro: Oriented to person, place, and situation.   Plan: Will give Albuterol/Atrovent Neb treatment now and Solu-Medrol 125 mg IM x 1 dose now.  Will reassess respiratory  status after interventions above.   [id number] - Patient sitting in the dayroom. No acute respiratory distress noted. Patient reports decreased shortness of breath and states "I feel better." Inspiratory and expiratory wheezes auscultated posterior RUQ and mild expiratory wheezes auscultated all other lobes. Decreased work of breathing noted. No retractions noted. Patient currently awaiting administration of Solu-Medrol.   Alberteen SamFran Zakyia Gagan, FNP-BC

## 2013-03-01 NOTE — Progress Notes (Signed)
Adult Psychoeducational Group Note  Date:  03/01/2013 Time:  9:04 PM  Group Topic/Focus:  Wrap-Up Group:   The focus of this group is to help patients review their daily goal of treatment and discuss progress on daily workbooks.  Participation Level:  Active  Participation Quality:  Appropriate  Affect:  Appropriate  Cognitive:  Appropriate  Insight: Appropriate  Engagement in Group:  Engaged  Modes of Intervention:  Discussion  Additional Comments:  Patient attended group. He said he has had a good day- he said "I have had better, I have had worse". He said that before he came here he didn't know when to ask for help. He said that today he learned that it is okay to ask for help when he gets overwhelmed.   Wesley Beck, Demeka Sutter A 03/01/2013, 9:04 PM

## 2013-03-01 NOTE — Progress Notes (Signed)
Psychoeducational Group Note  Date: 03/01/2013 Time:  1015  Group Topic/Focus:  Identifying Needs:   The focus of this group is to help patients identify their personal needs that have been historically problematic and identify healthy behaviors to address their needs.  Participation Level:  Active  Participation Quality:  Appropriate  Affect:  Appropriate  Cognitive:  Appropriate  Insight:  Improving  Engagement in Group:  Engaged  Additional Comments:    Kailash Hinze A 

## 2013-03-01 NOTE — [facility] Group Notes (Signed)
[facility] Group Notes:  (Nursing/MHT/Case Management/Adjunct)  Date:  03/01/2013  Time:  11:00 AM  Type of Therapy:  Psychoeducational Skills  Participation Level:  Active  Participation Quality:  Appropriate  Affect:  Appropriate  Cognitive:  Alert  Insight:  Appropriate  Engagement in Group:  Engaged  Modes of Intervention:  Discussion  Summary of Progress/Problems:Pt stated he wanted to learn how to appreciate himself and to work on his self esteem.  Rodman KeyWebb, Nael Petrosyan [facility] 03/01/2013, 11:00 AM

## 2013-03-01 NOTE — [facility] Suicide Risk Assessment (Signed)
Suicide Risk Assessment  Admission Assessment     Nursing information obtained from:  Patient Demographic factors:  Male Current Mental Status:  Suicidal ideation indicated by patient Loss Factors:  NA Historical Factors:  NA Risk Reduction Factors:  Religious beliefs about death  CLINICAL FACTORS:   Bipolar Disorder:   Mixed State Schizophrenia:   Paranoid or undifferentiated type  COGNITIVE FEATURES THAT CONTRIBUTE TO RISK:  Polarized thinking    SUICIDE RISK:   Mild:  Suicidal ideation of limited frequency, intensity, duration, and specificity.  There are no identifiable plans, no associated intent, mild dysphoria and related symptoms, good self-control (both objective and subjective assessment), few other risk factors, and identifiable protective factors, including available and accessible social support.  PLAN OF CARE: General Appearance: Disheveled  Eye Solicitor::  Fair  Speech:  Clear and Coherent and Normal Rate  Volume:  Normal  Mood:  "It could be worse it could be better."  Affect:  Restricted  Thought Process:  Coherent and Linear  Orientation:  Full (Time, Place, and Person)  Thought Content:  Obsessions and Paranoid Ideation  Suicidal Thoughts:Patient states that he can contract for safety in the hospital.  Homicidal Thoughts:  No  Memory:  Immediate;   Good Recent;   Good Remote;   Good  Judgement:  Fair  Insight:  Shallow  Psychomotor Activity:  Normal  Concentration:  Fair  Recall:  Good  Akathisia:  No  Handed:  Right  AIMS (if indicated):   as noted in chart  Assets:  Communication Skills  Sleep:  Number of Hours: 3.25     Assessment:   DSM5:  Schizophrenia Disorders:  Schizophrenia (295.7) Substance/Addictive Disorders:  Cannabis Use Disorder - Mild (305.20), Stimulant Use Disorder-Mild Bipolar I Disorder, most recent episode mixed,  AXIS I:  Bipolar, mixed, Substance Abuse and Schizophrenia AXIS II:  No diagnosis AXIS III:   Past Medical  History  Diagnosis Date  . Arthritis   . Bipolar 1 disorder   . Seasonal allergies   . Asthma   . Schizophrenia, paranoid type   . Chronic back pain    AXIS IV:  housing problems and problems with primary support group AXIS V:  21-30 behavior considerably influenced by delusions or hallucinations OR serious impairment in judgment, communication OR inability to function in almost all areas  Treatment Plan/Recommendations:  As noted below.  Treatment Plan Summary: Daily contact with patient to assess and evaluate symptoms and progress in treatment Medication management Current Medications:  Current Facility-Administered Medications  Medication Dose Route Frequency Provider Last Rate Last Dose  . acetaminophen (TYLENOL) tablet 650 mg  650 mg Oral Q6H PRN Larena Sox, MD      . albuterol (PROVENTIL HFA;VENTOLIN HFA) 108 (90 BASE) MCG/ACT inhaler 2 puff  2 puff Inhalation Q6H PRN Larena Sox, MD   2 puff at 03/01/13 0028  . alum & mag hydroxide-simeth (MAALOX/MYLANTA) 200-200-20 MG/5ML suspension 30 mL  30 mL Oral Q4H PRN Larena Sox, MD      . benztropine (COGENTIN) tablet 1 mg  1 mg Oral QHS Larena Sox, MD      . diphenhydrAMINE (BENADRYL) capsule 25 mg  25 mg Oral QHS PRN,MR X 1 Roniel Halloran J Shylo Zamor, MD      . magnesium hydroxide (MILK OF MAGNESIA) suspension 30 mL  30 mL Oral Daily PRN Larena Sox, MD      . sulfamethoxazole-trimethoprim (BACTRIM DS) 800-160 MG per tablet 1 tablet  1  tablet Oral BID Larena SoxShaji J Tobin Cadiente, MD   1 tablet at 03/01/13 0737  . thiothixene (NAVANE) capsule 5 mg  5 mg Oral QHS Mojeed Akintayo        Observation Level/Precautions:  15 minute checks  Laboratory:  None  Psychotherapy:  Patient will take part in groups  Medications:  Will increase Navane to 10 mg QHS. Continue all other medications.  Consultations:  None  Discharge Concerns:  Housing.   Estimated LOS: 5-7  Other:  Referral to outpatient care at discharge.    I certify  that inpatient services furnished can reasonably be expected to improve the patient's condition.  Ariely Riddell 03/01/2013, 10:54 AM

## 2013-03-02 DIAGNOSIS — F319 Bipolar disorder, unspecified: Secondary | ICD-10-CM

## 2013-03-02 MED ORDER — PREDNISONE 20 MG PO TABS: 20.0000 mg | ORAL_TABLET | Freq: Every day | ORAL | Status: DC

## 2013-03-02 MED ORDER — IPRATROPIUM BROMIDE 0.02 % IN SOLN
0.5000 mg | RESPIRATORY_TRACT | Status: DC | PRN
  Administered 2013-03-02: 0.5 mg via RESPIRATORY_TRACT

## 2013-03-02 MED ORDER — PREDNISONE (PAK) 10 MG PO TABS: 20.0000 mg | ORAL_TABLET | Freq: Every evening | ORAL | Status: DC

## 2013-03-02 MED ORDER — PREDNISONE (PAK) 10 MG PO TABS: 20.0000 mg | ORAL_TABLET | Freq: Every morning | ORAL | Status: DC

## 2013-03-02 MED ORDER — PREDNISONE 20 MG PO TABS: 20.0000 mg | ORAL_TABLET | Freq: Three times a day (TID) | ORAL | Status: DC

## 2013-03-02 MED ORDER — PREDNISONE 10 MG PO TABS
60.0000 mg | ORAL_TABLET | Freq: Every day | ORAL | Status: DC
  Administered 2013-03-03: 60 mg via ORAL
  Filled 2013-03-02 (×2): qty 3

## 2013-03-02 MED ORDER — IPRATROPIUM-ALBUTEROL 0.5-2.5 (3) MG/3ML IN SOLN: 3.0000 mL | RESPIRATORY_TRACT | Status: DC | PRN

## 2013-03-02 MED ORDER — PREDNISONE (PAK) 10 MG PO TABS: 10.0000 mg | ORAL_TABLET | Freq: Four times a day (QID) | ORAL | Status: DC

## 2013-03-02 MED ORDER — PREDNISONE (PAK) 10 MG PO TABS: 10.0000 mg | ORAL_TABLET | ORAL | Status: DC

## 2013-03-02 MED ORDER — METHYLPREDNISOLONE SODIUM SUCC 125 MG IJ SOLR
125.0000 mg | Freq: Once | INTRAMUSCULAR | Status: AC
  Administered 2013-03-02: 125 mg via INTRAMUSCULAR
  Filled 2013-03-02: qty 2

## 2013-03-02 MED ORDER — PREDNISONE 20 MG PO TABS
40.0000 mg | ORAL_TABLET | Freq: Every day | ORAL | Status: DC
  Filled 2013-03-02: qty 2

## 2013-03-02 MED ORDER — LEVOFLOXACIN 500 MG PO TABS
500.0000 mg | ORAL_TABLET | Freq: Every day | ORAL | Status: DC
  Administered 2013-03-02: 500 mg via ORAL
  Filled 2013-03-02: qty 1
  Filled 2013-03-02: qty 5
  Filled 2013-03-02: qty 1

## 2013-03-02 MED ORDER — ALBUTEROL SULFATE (2.5 MG/3ML) 0.083% IN NEBU
2.5000 mg | INHALATION_SOLUTION | RESPIRATORY_TRACT | Status: DC | PRN
  Administered 2013-03-02: 2.5 mg via RESPIRATORY_TRACT

## 2013-03-02 MED ORDER — PREDNISONE (PAK) 10 MG PO TABS: 10.0000 mg | ORAL_TABLET | Freq: Three times a day (TID) | ORAL | Status: DC
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
## 2013-03-02 NOTE — Progress Notes (Signed)
Patient ID: Wesley Beck, male   DOB: 31-Jul-1970, 43 y.o.   MRN: [id number] D)    Has been out and about this evening, was wearing gowns while his clothes were being washed, feeling better after he got his clothes back, had a good shower.  Has been pleasant and cooperative, brighter this evening, working a plan for discharge, doesn't want to go back to his current situation, talking about a long term program with options for work, vocation.  Attended group,  Voiced again importance of not waiting until a problem is overwhelming, to get help when realizes there is a problem. Compliant with meds, less anxious tonight. A)  Will continue to monitor for safety, continue POC, support R)  Safety maintained.

## 2013-03-02 NOTE — Progress Notes (Signed)
Adult Psychoeducational Group Note  Date:  03/02/2013 Time:  5:27 PM  Group Topic/Focus:  Therapeutic Activity   Participation Level:  Did Not Attend  Wesley MilesMercer, Wesley Beck 03/02/2013, 5:27 PM

## 2013-03-02 NOTE — Progress Notes (Signed)
Adult Psychoeducational Group Note  Date:  03/02/2013 Time:  9:52 PM  Group Topic/Focus:  Wrap-Up Group:   The focus of this group is to help patients review their daily goal of treatment and discuss progress on daily workbooks.  Participation Level:  Active  Participation Quality:  Appropriate  Affect:  Appropriate  Cognitive:  Appropriate  Insight: Appropriate  Engagement in Group:  Engaged  Modes of Intervention:  Discussion  Additional Comments:  The patient expressed that in group today he learned to reach out for help. The patient also said that he learns something in each group.  Octavio Mannshigpen, Nubia Ziesmer Lee 03/02/2013, 9:52 PM

## 2013-03-02 NOTE — [facility] Group Notes (Signed)
[facility] LCSW Group Therapy Note   03/02/2013  1:15 To 2:15 PM   Type of Therapy and Topic: Group Therapy: Feelings Around Returning Home & Establishing a Supportive Framework and Activity to Identify signs of Improvement or Decompensation   Participation Level: Minimal  Mood: Flat  Description of Group:  Patients first processed thoughts and feelings about up coming discharge. These included fears of upcoming changes, lack of change, new living environments, judgements and expectations from others and overall stigma of MH issues. We then discussed what is a supportive framework? What does it look like feel like and how do I discern it from and unhealthy non-supportive network? Learn how to cope when supports are not helpful and don't support you. Discuss what to do when your family/friends are not supportive.   Therapeutic Goals Addressed in Processing Group:  1. Patient will identify one healthy supportive network that they can use at discharge. 2. Patient will identify one factor of a supportive framework and how to tell it from an unhealthy network. 3. Patient able to identify one coping skill to use when they do not have positive supports from others. 4. Patient will demonstrate ability to communicate their needs through discussion and/or role plays.  Summary of Patient Progress:  Pt presents with flat affect during group session. As other patients processed their anxiety about discharge and described healthy supports Wesley Beck appeared attentive yet remained quiet.   [name], LCSW
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
## 2013-03-02 NOTE — Progress Notes (Addendum)
Wesley Beck  03/02/2013 5:50 PM Wesley Beck  MRN:  [id number]   Subjective:  Patient was seen and chart reviewed. During today's assessment, pt reported good sleep and great appetite. Currently rates depression at 2/10 but anxiety at 0/10. Pt denies SI, HI, and AVH. Upon discharge he is interested in rescue mission. Pt also expresses that he has to start making better decisions, and that he cant take care of everyone. Pt was complaining of increased SOB, wheezing and coughing.  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   Psychiatric/Behavioral: Negative for depression, suicidal ideas, hallucinations and substance abuse. The patient is not nervous/anxious and does not have insomnia.     Blood pressure 117/78, pulse 83, temperature 97.6 F (36.4 C), temperature source Oral, resp. rate 16, height 5\' 9"  (1.753 m), weight 88.905 kg (196 lb), SpO2 95.00%.Body mass index is 28.93 kg/(m^2).   General Appearance: Casual  Eye Contact::  Fair  Speech:  Clear and Coherent and Normal Rate  Volume:  Normal  Mood:  Euthymic  Affect:  Appropriate  Thought Process:  Coherent  Orientation:  Full  (Time, Place, and Person)  Thought Content:  WDL  Suicidal Thoughts:  No  Homicidal Thoughts:  No  Memory:  Immediate;   Fair Recent;   Fair Remote;   Fair  Judgement:  Intact  Insight:  Present  Psychomotor Activity:  Normal  Concentration:  Fair  Recall:  Good  Akathisia:  No  Handed:  Right  AIMS (if indicated):     Assets:  Communication Skills Desire for Improvement Talents/Skills  Sleep:  Number of Hours: 6.5   Current Medications: Current Facility-Administered Medications  Medication Dose Route Frequency Provider Last Rate Last Dose  . acetaminophen (TYLENOL) tablet 650 mg  650 mg Oral Q6H PRN Wesley Sox, Wesley Beck      . albuterol (PROVENTIL HFA;VENTOLIN HFA) 108 (90 BASE) MCG/ACT inhaler 2 puff  2 puff Inhalation Q6H PRN Wesley Sox, Wesley Beck   2 puff at 03/02/13 1036  . albuterol (PROVENTIL) (2.5 MG/3ML) 0.083% nebulizer solution 2.5 mg  2.5 mg Nebulization Q4H PRN Wesley Beck   2.5 mg at 03/02/13 1121   And  . ipratropium (ATROVENT) nebulizer solution 0.5 mg  0.5 mg Nebulization Q4H PRN Wesley Beck   0.5 mg at 03/02/13 1121  . alum & mag hydroxide-simeth (MAALOX/MYLANTA) 200-200-20 MG/5ML suspension 30 mL  30 mL Oral Q4H PRN Wesley Sox, Wesley Beck      . benztropine (COGENTIN) tablet 1 mg  1 mg Oral QHS Wesley Sox, Wesley Beck   1 mg at 03/01/13 2123  . diphenhydrAMINE (BENADRYL) capsule 25 mg  25 mg Oral QHS PRN,MR X 1 Wesley Sox, Wesley Beck   25 mg at 03/01/13 2045  .  levofloxacin (LEVAQUIN) tablet 500 mg  500 mg Oral q1800 Wesley Haywardakia S Starkes, Wesley Beck   500 mg at 03/02/13 1119  . magnesium hydroxide (MILK OF MAGNESIA) suspension 30 mL  30 mL Oral Daily PRN Wesley SoxShaji J Puthuvel, Wesley Beck      . Melene Muller[START ON 03/03/2013] predniSONE (DELTASONE) tablet 60 mg  60 mg Oral Q breakfast Wesley Haywardakia S Starkes, Wesley Beck       Followed by  . [START ON 03/05/2013] predniSONE (DELTASONE) tablet 40 mg  40 mg Oral Q breakfast Wesley Haywardakia S Starkes, Wesley Beck       Followed by  . [START ON 03/07/2013] predniSONE (DELTASONE) tablet  20 mg  20 mg Oral Q breakfast Wesley Haywardakia S Starkes, Wesley Beck      . sulfamethoxazole-trimethoprim (BACTRIM DS) 800-160 MG per tablet 1 tablet  1 tablet Oral BID Wesley SoxShaji J Puthuvel, Wesley Beck   1 tablet at 03/02/13 0808  . thiothixene (NAVANE) capsule 10 mg  10 mg Oral QHS Wesley SoxShaji J Puthuvel, Wesley Beck   10 mg at 03/01/13 2123    Lab Results: No results found for this or any previous visit (from the past 48 hour(s)).  Physical Findings: Lungs scattered rales, and rhonchi throughout R/L lobe. Reassessed after breathing treatment, minimal wheezing. Pt is waiting to receive Solu-medrol injection.  AIMS: Facial and Oral Movements Muscles of Facial Expression: None, normal Lips and Perioral Area: None, normal Jaw: None, normal Tongue: None, normal,Extremity Movements Upper (arms, wrists, hands, fingers): None, normal Lower (legs, knees, ankles, toes): None, normal, Trunk Movements Neck, shoulders, hips: None, normal, Overall Severity Severity of abnormal movements (highest score from questions above): None, normal Incapacitation due to abnormal movements: None, normal Patient's awareness of abnormal movements (rate only patient's report): No Awareness, Dental Status Current problems with teeth and/or dentures?: No Does patient usually wear dentures?: No  CIWA:    COWS:     Treatment Plan Summary: Daily contact with patient to assess and evaluate symptoms and progress in treatment Medication management  Plan:Plan: Review of chart, vital signs, medications, and notes.  1-Admit for crisis management and stabilization. Estimated length of stay 5-7 days past his current stay of 1  2-Individual and group therapy encouraged  3-Medication management for depression and anxiety to reduce current symptoms to base line and improve the patient's overall level of functioning: Medications reviewed with the patient and Trazodone added for sleep issues and Vistaril 25 mg every six hours PRN anxiety  4-Coping skills for depression,  substance abuse, anger issues, and anxiety developing--  5-Continue crisis stabilization and management  6-Address health issues--monitoring vital signs, asthma exacerbation-Solumedrol 125mg  IM in a single dose. Duoneb nebulizer Q4hr prn wheezing, dyspnea. Will discontinue Bactirm, start Levaquin 500mg  1 tablet po daily x 7 days(hx of smoker), Pred Dose Pack x 6 days.  7-Treatment plan in progress to prevent relapse of depression, angry outbursts, and anxiety  8-Psychosocial education regarding relapse prevention and self-care  9-Health care follow up as needed for any health concerns  10-Call for consult with hospitalist for additional specialty patient services as needed.   Medical Decision Making Problem Points:  Established problem, stable/improving (1), Review of last therapy session (1) and Review of psycho-social stressors (1) Data Points:  Review or order clinical lab tests (1) Review or order medicine tests (1) Review of medication regiment & side effects (2) Review of new medications or change in dosage (2)  I certify that inpatient services furnished can reasonably be expected to improve the patient's condition.   Wesley HaywardSTARKES, Mamie Hundertmark S  03/02/2013, 5:50 PM

## 2013-03-02 NOTE — Progress Notes (Signed)
Per NP pt needed a breathing treatment. Pt was lying flat in the bed and was instructed to elevate the head. He was given a combo breathing treatment and 500mg  of levaquin. Spoke with pharmacy concerning solumedrol and security will bring it from the main hospital. Pt does not appear in resp distress but states he can not stop coughing.NP in with pt at this time monitoring him. He did state breathing treatments make him feel better and stated,"I need to stop smoking."

## 2013-03-02 NOTE — Progress Notes (Signed)
Psychoeducational Group Note  Date: 03/02/2013 Time: 0930  Group Topic/Focus:  Gratefulness:  The focus of this group is to help patients identify what two things they are most grateful for in their lives. What helps ground them and to center them on their work to their recovery.  Participation Level:  Active  Participation Quality:  Attentive  Affect:  Appropriate  Cognitive:  Appropriate  Insight:  Improving  Engagement in Group:  Engaged  Additional Comments:    Satcha Storlie A  

## 2013-03-02 NOTE — Progress Notes (Signed)
Psychoeducational Group Note  Date:  03/02/2013 Time:  1015  Group Topic/Focus:  Making Healthy Choices:   The focus of this group is to help patients identify negative/unhealthy choices they were using prior to admission and identify positive/healthier coping strategies to replace them upon discharge.  Participation Level:  Active  Participation Quality:  Appropriate  Affect:  Appropriate  Cognitive:  Oriented  Insight:  Improving  Engagement in Group:  Engaged  Additional Comments:    Josalynn Johndrow A 03/02/2013 

## 2013-03-02 NOTE — Progress Notes (Signed)
Patient ID: Wesley Beck, male   DOB: May 04, 1970, 43 y.o.   MRN: [id number] D)   Came to the med window at the beginning of the shift to ask about when he could have his navane, stated was feeling rather anxious and thought it would help, wanted to go to bed. Also requested and was given his albuterol inhaler as he was feeling slightly sob.  Agreed to go to group and he participated, felt he had learned he shouldn't wait until he was overwhelmed to get help.  Has been pleasant and cooperative, compliant, minimal interaction noted with peers.  Came back to med window after group, got his meds and went to bed. A)  Will continue to monitor for safety, encourage more group participation, support and encouragement, continue POC R)  Safety maintained.

## 2013-03-03 MED ORDER — BENZTROPINE MESYLATE 1 MG PO TABS: 1.0000 mg | ORAL_TABLET | Freq: Every day | ORAL | Status: DC

## 2013-03-03 MED ORDER — THIOTHIXENE 10 MG PO CAPS: 10.0000 mg | ORAL_CAPSULE | Freq: Every day | ORAL | Status: DC

## 2013-03-03 MED ORDER — SULFAMETHOXAZOLE-TMP DS 800-160 MG PO TABS: 1.0000 | ORAL_TABLET | Freq: Two times a day (BID) | ORAL | Status: DC

## 2013-03-03 MED ORDER — PREDNISONE 20 MG PO TABS: 60.0000 mg | ORAL_TABLET | Freq: Every day | ORAL | Status: DC

## 2013-03-03 MED ORDER — LEVOFLOXACIN 500 MG PO TABS: 500.0000 mg | ORAL_TABLET | Freq: Every day | ORAL | Status: DC

## 2013-03-03 NOTE — Progress Notes (Signed)
D:  Patient's self inventory sheet, patient sleeps well, good appetite, normal energy level, improving attention span.  Rated depression, hopeless, anxiety #1.  Denied withdrawals.  Denied SI.  Denied physical problems.  Would like to get back with [facility] after discharge.  No discharge plans.  No problems taking medications after discharge. A:  Medications administered per MD orders.  Emotional support and encouragement given patient. R:  Denied SI and HI.  Denied A/V hallucinations.  Denied pain.  Will continue to monitor patient for safety with 15 minute checks.  Safety maintained.

## 2013-03-03 NOTE — Progress Notes (Signed)
Recreation Therapy Notes  Date: 01.26.2015 Time: 2:45pm Location: [address] Day[address]   Group Topic: Wellness  Goal Area(s) Addresses:  Patient will identify dimension of wellness they most struggle with.  Patient will identify at least 2 ways to invest in that type of wellness.   Behavioral Response: Did not attend.   Mikaelyn Arthurs L Siara Gorder, LRT/CTRS  Dameon Soltis L 03/03/2013 4:19 PM 

## 2013-03-03 NOTE — Care Management Utilization Note (Signed)
Per State Regulation 482.30  The chart was reviewed for necessity with respect to the patient's Admission/ Duration of stay.  Reviewed 03/03/13  Next Review Date: 03/06/13  Lacinda AxonAlice Luvern Mcisaac, RN, BSN

## 2013-03-03 NOTE — Progress Notes (Addendum)
Pt attended spiritual care group on grief and loss facilitated by chaplain Burnis KingfisherMatthew Khush Pasion.  Group opened with brief discussion and psycho-social ed around grief and loss in relationships and in relation to self - identifying life patterns, circumstances, changes that cause losses. Established group norm of speaking from own life experience. Group goal of establishing open and affirming space for members to share loss and experience with grief, normalize grief experience and provide psycho social education and grief support.   Wesley Beck (Jusitn) was present and attentive throughout group.  Affect was appropriate.  Did not contribute to group conversation.  Expressed appreciation for group time afterwards with chaplain.  Was not specific about elements he found helpful, as he was entering treatment team meeting.    Belva CromeStalnaker, Nathon Stefanski Wayne MDiv

## 2013-03-03 NOTE — Care Management Utilization Note (Signed)
Per State Regulation 482.30  The chart was reviewed for necessity with respect to the patient's Admission/ Duration of stay.Admission for 02/28/13   Next Review Date: 03/03/2013  Lacinda AxonAlice Denzil Bristol, RN, BSN

## 2013-03-03 NOTE — Tx Team (Signed)
Interdisciplinary Treatment Plan Update   Date Reviewed:  03/03/2013  Time Reviewed:  8:36 AM  Progress in Treatment:   Attending groups: Yes Participating in groups: Yes Taking medication as prescribed: Yes  Tolerating medication: Yes Family/Significant other contact made: Yes, contact made with father. Patient understands diagnosis: Yes  Discussing patient identified problems/goals with staff: Yes Medical problems stabilized or resolved: Yes Denies suicidal/homicidal ideation: Yes Patient has not harmed self or others: Yes  For review of initial/current patient goals, please see plan of care.  Estimated Length of Stay:  Discharge today  Reasons for Continued Hospitalization:   New Problems/Goals identified:    Discharge Plan or Barriers:   Home with outpatient follow up with [facility]  Additional Comments:  N/A    Attendees:  Patient:  03/03/2013 8:36 AM   Signature: Mervyn GayJ. Jonnalagadda, MD 03/03/2013 8:36 AM  Signature:  03/03/2013 8:36 AM  Signature:  Claudette Headonrad Withrow, NP 03/03/2013 8:36 AM  Signature:Beverly Terrilee CroakKnight, RN 03/03/2013 8:36 AM  Signature:  Neill Loftarol Davis RN 03/03/2013 8:36 AM  Signature:  Juline PatchQuylle Namish Krise, LCSW 03/03/2013 8:36 AM  Signature:  Reyes Ivanhelsea Horton, LCSW 03/03/2013 8:36 AM  Signature:  Leisa LenzValerie Enoch, Care Coordinator 03/03/2013 8:36 AM  Signature:   03/03/2013 8:36 AM  Signature:  03/03/2013  8:36 AM  Signature:   Onnie BoerJennifer Clark, RN [facility] 03/03/2013  8:36 AM  Signature:  03/03/2013  8:36 AM    Scribe for Treatment Team:   Juline PatchQuylle Lyriq Jarchow,  03/03/2013 8:36 AM

## 2013-03-03 NOTE — Discharge Summary (Signed)
Physician Discharge Summary Note  Patient:  Wesley Beck is an 43 y.o., male MRN:  [id number] DOB:  1970-05-30 Patient phone:  [phone] (home)  Patient address:   [address] [place] [address],   Date of Admission:  02/28/2013 Date of Discharge: 03/03/2013   Reason for Admission:  Major Depression, Recurrent, Severe  Discharge Diagnoses: Active Problems:   Bipolar 1 disorder, mixed  Review of Systems  Constitutional: Negative.   HENT: Negative.   Eyes: Negative.   Respiratory: Negative.   Cardiovascular: Negative.   Gastrointestinal: Negative.   Genitourinary: Negative.   Musculoskeletal: Negative.   Skin: Negative.   Neurological: Negative.   Endo/Heme/Allergies: Negative.   Psychiatric/Behavioral: Negative.      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 Arthritis   . Bipolar 1 disorder   . Seasonal allergies   . Asthma   . Schizophrenia, paranoid type   . Chronic back pain    AXIS IV:  other psychosocial or environmental problems and problems related to social environment AXIS V:  61-70 mild symptoms  Level of Care:  OP  Hospital Course:   Mr. Yamada is a 43 y/o male with a past psychiatric history significant for Bipolar I Disorder, most recent episode depressed with psychotic features and Ploysubstance Dependence The patient is referred for psychiatric services for psychiatric evaluation and medication management.   During Hospitalization: Medications managed, psychoeducation, group and individual therapy. Pt currently denies SI, HI, and Psychosis. At discharge, pt minimizes depression and anxiety, consistent  with presentation. Pt states that he does have a good supportive home environment and will followup with outpatient treatment. Affirms agreement with medication regimen and discharge plan. Denies other physical and psychological concerns at time of discharge.   Consults:  None  Significant Diagnostic Studies:  None  Discharge Vitals:   Blood pressure 102/66, pulse 79, temperature 97.8 F (36.6 C), temperature source Oral, resp. rate 20, height 5\' 9"  (1.753 m), weight 88.905 kg (196 lb), SpO2 95.00%. Body mass index is 28.93 kg/(m^2). Lab Results:   No results found for this or any previous visit (from the past 72 hour(s)).  Physical Findings: AIMS: Facial and Oral Movements Muscles of Facial Expression: None, normal Lips and Perioral Area: None, normal Jaw: None, normal Tongue: None, normal,Extremity Movements Upper (arms, wrists, hands, fingers): None, normal Lower (legs, knees, ankles, toes): None, normal, Trunk Movements Neck, shoulders, hips: None, normal, Overall Severity Severity of abnormal movements (highest score from questions above): None, normal Incapacitation due to abnormal movements: None, normal Patient's awareness of abnormal movements (rate only patient's report): No Awareness, Dental Status Current problems with teeth and/or dentures?: No Does patient usually wear dentures?: No  CIWA:  CIWA-Ar Total: 1 COWS:  COWS Total Score: 1  Psychiatric Specialty Exam: See Psychiatric Specialty Exam and Suicide Risk Assessment completed by Attending Physician prior to discharge.  Discharge destination:  Home  Is patient on multiple antipsychotic therapies at discharge:  No   Has Patient had three or more failed trials of antipsychotic monotherapy by history:  No  Recommended Plan for Multiple Antipsychotic Therapies: NA     Medication List    ASK your doctor about these medications  Indication   BEE POLLEN PO  Take 1 tablet by mouth 2 (two) times daily.       benztropine 1 MG tablet  Commonly known as:  COGENTIN  Take 1 mg by mouth daily.      BREWERS YEAST PO  Take 1 tablet by mouth 2 (two) times daily.      CINNAMON PO  Take 1 capsule by mouth 2 (two) times daily.      MELATONIN PO  Take 1 tablet by mouth at bedtime as needed (FOR SLEEP).      PROAIR HFA 108 (90 BASE) MCG/ACT inhaler  Generic drug:  albuterol  Inhale 2 puffs into the lungs every 6 (six) hours as needed for wheezing or shortness of breath.      sulfamethoxazole-trimethoprim 800-160 MG per tablet  Commonly known as:  BACTRIM DS  Take 1 tablet by mouth 2 (two) times daily.      tetrahydrozoline-zinc 0.05-0.25 % ophthalmic solution  Commonly known as:  VISINE-AC  Place 1 drop into both eyes 2 (two) times daily as needed (itching).      thiothixene 5 MG capsule  Commonly known as:  NAVANE  Take 5 mg by mouth daily.      Vitamin B-12 500 MCG Subl  Place 1 tablet under the tongue 2 (two) times daily.          Follow-up recommendations:  Activity:  As tolerated. Diet:  Heart healthy with low sodium.  Comments:   Take all medications as prescribed. Keep all follow-up appointments as scheduled.  Do not consume alcohol or use illegal drugs while on prescription medications. Report any adverse effects from your medications to your primary care provider promptly.  In the event of recurrent symptoms or worsening symptoms, call 911, a crisis hotline, or go to the nearest emergency department for evaluation.   Total Discharge Time:  Greater than 30 minutes.  Signed: Beau FannyWithrow, John C, FNP-BC 03/03/2013, 12:12 PM  Patient was seen for psychiatric evaluation, suicide risk assessment and case discussed with the treatment team and a physician extender. Made disposition plan and Reviewed the information documented and agree with the treatment plan.  Safiatou Islam,JANARDHAHA R. 03/05/2013 12:53 PM

## 2013-03-03 NOTE — [facility] Suicide Risk Assessment (Signed)
[facility] INPATIENT:  Family/Significant Other Suicide Prevention Education  Suicide Prevention Education:  Education Completed; Madaline GuthrieBill Impson, FAther, [phone];  has been identified by the patient as the family member/significant other with whom the patient will be residing, and identified as the person(s) who will aid the patient in the event of a mental health crisis (suicidal ideations/suicide attempt).  With written consent from the patient, the family member/significant other has been provided the following suicide prevention education, prior to the and/or following the discharge of the patient.  The suicide prevention education provided includes the following:  Suicide risk factors  Suicide prevention and interventions  National Suicide Hotline telephone number  [facility] assessment telephone number  [facility] Emergency Assistance 911  [facility] and/or Residential Mobile Crisis Unit telephone number  Request made of family/significant other to:  Remove weapons (e.g., guns, rifles, knives), all items previously/currently identified as safety concern.  Father advised patient does not have access to weapons.    Remove drugs/medications (over-the-counter, prescriptions, illicit drugs), all items previously/currently identified as a safety concern.  The family member/significant other verbalizes understanding of the suicide prevention education information provided.  The family member/significant other agrees to remove the items of safety concern listed above.  Wynn BankerHodnett, Darivs Lunden Hairston 03/03/2013, 12:52 PM

## 2013-03-03 NOTE — Progress Notes (Addendum)
[facility] Management Discharge Plan :  Will you be returning to the same living situation after discharge: Yes,  Patient is returning to his home. At discharge, do you have transportation home?:Yes,  Father will transport patient home. Do you have the ability to pay for your medications:  Yes, patient has Medicaid.  Release of information consent forms completed and in the chart;  Patient's signature needed at discharge.  Patient to Follow up at:  [facility] [place]   [phone]  Wednesday, March 05, 2013  Between 7:45 - 11:00 AM    [address] [place]   [phone]     [place]    [phone]   Patient denies SI/HI:   Patient no longer endorsing SI/HI or other thoughts of self harm.     Safety Planning and Suicide Prevention discussed: .Reviewed with all patients during discharge planning group  Wesley Beck, Wesley Beck 03/03/2013, 12:54 PM

## 2013-03-03 NOTE — Progress Notes (Signed)
Discharge Note:  Patient discharged home with family.  Denied SI and HI.  Denied A/V hallucinations.  Patient received all his belongings, prescriptions, medications.  Suicide prevention information given and discussed with patient who stated he understood and had no questions.  Patient stated he [facility].

## 2013-03-03 NOTE — [facility] Suicide Risk Assessment (Signed)
Suicide Risk Assessment  Discharge Assessment     Demographic Factors:  Male, Adolescent or young adult, Caucasian, Low socioeconomic status and Unemployed  Mental Status Per Nursing Assessment::   On Admission:  Suicidal ideation indicated by patient  Current Mental Status by Physician: Mental Status Examination: Patient appeared as per his stated age, casually dressed, and fairly groomed, and maintaining good eye contact. Patient has good mood and his affect was constricted. He has normal rate, rhythm, and volume of speech. His thought process is linear and goal directed. Patient has denied suicidal, homicidal ideations, intentions or plans. Patient has no evidence of auditory or visual hallucinations, delusions, and paranoia. Patient has fair insight judgment and impulse control.  Loss Factors: Financial problems/change in socioeconomic status  Historical Factors: Family history of mental illness or substance abuse and Impulsivity  Risk Reduction Factors:   Sense of responsibility to family, Religious beliefs about death, Positive social support, Positive therapeutic relationship and Positive coping skills or problem solving skills  Continued Clinical Symptoms:  Bipolar Disorder:   Depressive phase Schizophrenia:   Paranoid or undifferentiated type Previous Psychiatric Diagnoses and Treatments Medical Diagnoses and Treatments/Surgeries  Cognitive Features That Contribute To Risk:  Polarized thinking    Suicide Risk:  Minimal: No identifiable suicidal ideation.  Patients presenting with no risk factors but with morbid ruminations; may be classified as minimal risk based on the severity of the depressive symptoms  Discharge Diagnoses:   AXIS I:  Schizoaffective Disorder AXIS II:  Deferred AXIS III:   Past Medical History  Diagnosis Date  . Arthritis   . Bipolar 1 disorder   . Seasonal allergies   . Asthma   . Schizophrenia, paranoid type   . Chronic back pain    AXIS  IV:  other psychosocial or environmental problems, problems related to social environment and problems with primary support group AXIS V:  61-70 mild symptoms  Plan Of Care/Follow-up recommendations:  Activity:  As tolerated Diet:  Regular  Is patient on multiple antipsychotic therapies at discharge:  No   Has Patient had three or more failed trials of antipsychotic monotherapy by history:  No  Recommended Plan for Multiple Antipsychotic Therapies: NA  Walid Haig,JANARDHAHA R. 03/03/2013, 12:54 PM

## 2013-03-06 NOTE — Progress Notes (Signed)
Patient Discharge Instructions:  After Visit Summary (AVS):   Faxed to:  03/06/13 Discharge Summary Note:   Faxed to:  03/06/13 Psychiatric Admission Assessment Note:   Faxed to:  03/06/13 Suicide Risk Assessment - Discharge Assessment:   Faxed to:  03/06/13 Faxed/Sent to the Next Level Care provider:  03/06/13 [facility] @ [phone]  Jerelene ReddenSheena E Aceitunas, 03/06/2013, 3:59 PM

## 2013-03-13 ENCOUNTER — Emergency Department (HOSPITAL_COMMUNITY)
Admission: EM | Admit: 2013-03-13 | Discharge: 2013-03-14 | Disposition: A | Discharge status: Home / Self Care | Payer: MEDICAID | Attending: Emergency Medicine | Admitting: Emergency Medicine

## 2013-03-13 ENCOUNTER — Encounter (HOSPITAL_COMMUNITY): Payer: Self-pay | Admitting: Emergency Medicine

## 2013-03-13 DIAGNOSIS — Z8739 Personal history of other diseases of the musculoskeletal system and connective tissue: Secondary | ICD-10-CM | POA: Insufficient documentation

## 2013-03-13 DIAGNOSIS — J45909 Unspecified asthma, uncomplicated: Secondary | ICD-10-CM | POA: Insufficient documentation

## 2013-03-13 DIAGNOSIS — F319 Bipolar disorder, unspecified: Secondary | ICD-10-CM | POA: Insufficient documentation

## 2013-03-13 DIAGNOSIS — Z8659 Personal history of other mental and behavioral disorders: Secondary | ICD-10-CM | POA: Insufficient documentation

## 2013-03-13 DIAGNOSIS — R4182 Altered mental status, unspecified: Secondary | ICD-10-CM | POA: Insufficient documentation

## 2013-03-13 DIAGNOSIS — G8929 Other chronic pain: Secondary | ICD-10-CM | POA: Insufficient documentation

## 2013-03-13 DIAGNOSIS — Z79899 Other long term (current) drug therapy: Secondary | ICD-10-CM | POA: Insufficient documentation

## 2013-03-13 DIAGNOSIS — F17200 Nicotine dependence, unspecified, uncomplicated: Secondary | ICD-10-CM | POA: Insufficient documentation

## 2013-03-13 DIAGNOSIS — IMO0002 Reserved for concepts with insufficient information to code with codable children: Secondary | ICD-10-CM | POA: Insufficient documentation

## 2013-03-13 HISTORY — DX: Major depressive disorder, single episode, unspecified: F32.9

## 2013-03-13 HISTORY — DX: Depression, unspecified: F32.A

## 2013-03-13 LAB — CBC
HEMATOCRIT: 45.9 % (ref 39.0–52.0)
Hemoglobin: 15.7 g/dL (ref 13.0–17.0)
MCH: 32.2 pg (ref 26.0–34.0)
MCHC: 34.2 g/dL (ref 30.0–36.0)
MCV: 94.3 fL (ref 78.0–100.0)
Platelets: 379 10*3/uL (ref 150–400)
RBC: 4.87 MIL/uL (ref 4.22–5.81)
RDW: 14.1 % (ref 11.5–15.5)
WBC: 10.2 10*3/uL (ref 4.0–10.5)

## 2013-03-13 LAB — BASIC METABOLIC PANEL
BUN: 14 mg/dL (ref 6–23)
CHLORIDE: 102 meq/L (ref 96–112)
CO2: 24 mEq/L (ref 19–32)
Calcium: 9.1 mg/dL (ref 8.4–10.5)
Creatinine, Ser: 1 mg/dL (ref 0.50–1.35)
GFR calc Af Amer: >90 mL/min (ref >90)
GFR calc non Af Amer: >90 mL/min (ref >90)
Glucose, Bld: 115 mg/dL — ABNORMAL HIGH (ref 70–99)
POTASSIUM: 3.5 meq/L — AB (ref 3.7–5.3)
SODIUM: 140 meq/L (ref 137–147)

## 2013-03-13 LAB — HEPATIC FUNCTION PANEL
ALK PHOS: 82 U/L (ref 39–117)
ALT: 26 U/L (ref 0–53)
AST: 24 U/L (ref 0–37)
Albumin: 3.6 g/dL (ref 3.5–5.2)
Bilirubin, Direct: <0.2 mg/dL (ref 0.0–0.3)
Total Bilirubin: 0.5 mg/dL (ref 0.3–1.2)
Total Protein: 7 g/dL (ref 6.0–8.3)

## 2013-03-13 LAB — RAPID URINE DRUG SCREEN, HOSP PERFORMED
AMPHETAMINES: NOT DETECTED
Barbiturates: NOT DETECTED
Benzodiazepines: NOT DETECTED
COCAINE: POSITIVE — AB
OPIATES: NOT DETECTED
Tetrahydrocannabinol: POSITIVE — AB

## 2013-03-13 LAB — ETHANOL: Alcohol, Ethyl (B): <11 mg/dL (ref 0–11)

## 2013-03-13 MED ORDER — LORAZEPAM 1 MG PO TABS: 1.0000 mg | ORAL_TABLET | ORAL | Status: DC | PRN

## 2013-03-13 MED ORDER — BENZTROPINE MESYLATE 1 MG PO TABS
1.0000 mg | ORAL_TABLET | Freq: Every day | ORAL | Status: DC
  Administered 2013-03-13: 1 mg via ORAL
  Filled 2013-03-13: qty 1

## 2013-03-13 MED ORDER — THIOTHIXENE 10 MG PO CAPS
10.0000 mg | ORAL_CAPSULE | Freq: Every day | ORAL | Status: DC
  Administered 2013-03-13: 10 mg via ORAL
  Filled 2013-03-13 (×2): qty 1

## 2013-03-13 MED ORDER — THIOTHIXENE 2 MG PO CAPS
ORAL_CAPSULE | ORAL | Status: AC
  Filled 2013-03-13: qty 5

## 2013-03-13 MED ORDER — ALBUTEROL SULFATE HFA 108 (90 BASE) MCG/ACT IN AERS: 2.0000 | INHALATION_SPRAY | Freq: Four times a day (QID) | RESPIRATORY_TRACT | Status: DC | PRN

## 2013-03-13 NOTE — ED Provider Notes (Signed)
CSN: [id number]     Arrival date & time 03/13/13  1821 History   First MD Initiated Contact with Patient 03/13/13 1858     Chief Complaint  Patient presents with  . V70.1   (Consider location/radiation/quality/duration/timing/severity/associated sxs/prior Treatment) Patient is a 43 y.o. male presenting with altered mental status. The history is provided by the patient (the pt complains he is depressed and suicidal). No language interpreter was used.  Altered Mental Status Presenting symptoms: no behavior changes   Severity:  Severe Most recent episode:  2 days ago Episode history:  Multiple Timing:  Constant Progression:  Worsening Chronicity:  Recurrent Context: not dementia   Associated symptoms: no abdominal pain, no hallucinations, no headaches, no rash and no seizures     Past Medical History  Diagnosis Date  . Arthritis   . Bipolar 1 disorder   . Seasonal allergies   . Asthma   . Schizophrenia, paranoid type   . Chronic back pain    History reviewed. No pertinent past surgical history. No family history on file. History  Substance Use Topics  . Smoking status: Current Every Day Smoker -- 0.50 packs/day for 25 years    Types: Cigarettes  . Smokeless tobacco: Never Used  . Alcohol Use: No    Review of Systems  Constitutional: Negative for appetite change and fatigue.  HENT: Negative for congestion, ear discharge and sinus pressure.   Eyes: Negative for discharge.  Respiratory: Negative for cough.   Cardiovascular: Negative for chest pain.  Gastrointestinal: Negative for abdominal pain and diarrhea.  Genitourinary: Negative for frequency and hematuria.  Musculoskeletal: Negative for back pain.  Skin: Negative for rash.  Neurological: Negative for seizures and headaches.  Psychiatric/Behavioral: Positive for dysphoric mood. Negative for hallucinations.    Allergies  Review of patient's allergies indicates no known allergies.  Home Medications   Current  Outpatient Rx  Name  Route  Sig  Dispense  Refill  . albuterol (PROAIR HFA) 108 (90 BASE) MCG/ACT inhaler   Inhalation   Inhale 2 puffs into the lungs every 6 (six) hours as needed for wheezing or shortness of breath.         . benztropine (COGENTIN) 1 MG tablet   Oral   Take 1 tablet (1 mg total) by mouth at bedtime.   30 tablet   0   . Cyanocobalamin (VITAMIN B-12) 500 MCG SUBL   Sublingual   Place 1 tablet under the tongue 2 (two) times daily.         . hydrocortisone cream 0.5 %   Topical   Apply 1 application topically daily as needed for itching.         . tetrahydrozoline-zinc (VISINE-AC) 0.05-0.25 % ophthalmic solution   Both Eyes   Place 1 drop into both eyes 2 (two) times daily as needed (itching).         . thiothixene (NAVANE) 10 MG capsule   Oral   Take 1 capsule (10 mg total) by mouth at bedtime.   30 capsule   0    BP 136/83  Pulse 83  Temp(Src) 97.9 F (36.6 C) (Oral)  Resp 18  Ht 5\' 9"  (1.753 m)  Wt 195 lb (88.451 kg)  BMI 28.78 kg/m2  SpO2 97% Physical Exam  Constitutional: He is oriented to person, place, and time. He appears well-developed.  HENT:  Head: Normocephalic.  Eyes: Conjunctivae and EOM are normal. No scleral icterus.  Neck: Neck supple. No thyromegaly present.  Cardiovascular: Normal  rate and regular rhythm.  Exam reveals no gallop and no friction rub.   No murmur heard. Pulmonary/Chest: No stridor. He has no wheezes. He has no rales. He exhibits no tenderness.  Abdominal: He exhibits no distension. There is no tenderness. There is no rebound.  Musculoskeletal: Normal range of motion. He exhibits no edema.  Lymphadenopathy:    He has no cervical adenopathy.  Neurological: He is oriented to person, place, and time. He exhibits normal muscle tone. Coordination normal.  Skin: No rash noted. No erythema.  Psychiatric:  Depressed and suicidal    ED Course  Procedures (including critical care time) Labs Review Labs  Reviewed  BASIC METABOLIC PANEL - Abnormal; Notable for the following:    Potassium 3.5 (*)    Glucose, Bld 115 (*)    All other components within normal limits  CBC  ETHANOL  HEPATIC FUNCTION PANEL  URINE RAPID DRUG SCREEN (HOSP PERFORMED)   Imaging Review No results found.  EKG Interpretation   None       MDM  No diagnosis found.     Benny LennertJoseph L Gustie Bobb, MD 03/13/13 2102

## 2013-03-13 NOTE — ED Notes (Signed)
Pt has been wanded by security. 

## 2013-03-13 NOTE — ED Notes (Signed)
Pt states, "Im living in a bad situation and if I can't get out, then I just want to die." Pt was recently kicked out of his dads guest house. States he was going to the homeless shelter but waited too late to go. [facility]. Pt states, "i have no plans to commit suicide in the near future, but I just need help"

## 2013-03-13 NOTE — ED Notes (Signed)
Pt advises that he was kicked out of his fathers house after taking car without premission. He has no where to stay, no friends to call saying he has "burned a lot of bridges over the years". Waited to late to call homeless shelter in [place]. Pt states he has not taking his medications in the past 3 days, he is not out of meds just has forgotten to take them. Pt denies HI but states he has thought about suicide, no plan noted. Pt basket of belongings placed in locker w/ labeled bags.

## 2013-03-13 NOTE — ED Notes (Signed)
Pt provided drink at this time  

## 2013-03-14 ENCOUNTER — Encounter (HOSPITAL_COMMUNITY): Payer: Self-pay | Admitting: *Deleted

## 2013-03-14 DIAGNOSIS — F3130 Bipolar disorder, current episode depressed, mild or moderate severity, unspecified: Secondary | ICD-10-CM

## 2013-03-14 NOTE — ED Notes (Signed)
TTS set up in room & process explained to pt.

## 2013-03-14 NOTE — ED Notes (Signed)
Offered pt telephone to call himself a ride.

## 2013-03-14 NOTE — Consult Note (Signed)
Telepsych Consultation   Reason for Consult:  Referral for psychiatric evaluation Referring Physician: EDP Zammit/Campos Wesley Beck is an 43 y.o. male.  Assessment: AXIS I:  Bipolar, Depressed AXIS II:  Deferred AXIS III:   Past Medical History  Diagnosis Date  . Arthritis   . Bipolar 1 disorder   . Seasonal allergies   . Asthma   . Schizophrenia, paranoid type   . Chronic back pain   . Depression    AXIS IV:  housing problems and other psychosocial or environmental problems AXIS V:  51-60 moderate symptoms  Plan:  No evidence of imminent risk to self or others at present.   Patient does not meet criteria for psychiatric inpatient admission.  Subjective:   Wesley Beck is a 43 y.o. male patient who presented to [facility] for evaluation of altered mental status.  Patient states that he is in the emergency department because "I have no where for me and my son to go and I'm feeling hopeless."  Patient states that he was living with his father until Wednesday night when his father told him he couldn't live with him anymore. His son, who is 49 yo, is currently staying with patient's father and patient states that his son "is welcomed to stay there." Patient denies SI, HI or AVH at this time. Patient is able to contract for safety. Patient was discharged from [facility] on [facility]/03/13 and was to follow-up with [facility] on 03/05/13. Patient states that he missed that appointment and that he has to reschedule.  Patient reports his current housing situation as a major stressor for him. Patient was asked what his plans were if he was discharged from the ER and patient stated "I have no where to go. I would do something to get thrown into jail. I Beck't be out in 20 degree weather."   HPI:  43 year old male here for evaluation of altered mental status HPI Elements:   Location:  Mood. Quality:  Depressed. Severity:  Moderately Controlled. Timing:  Loss of housing. Duration:  2  days. Context:  Housing and Financial stressors.  Past Psychiatric History: Past Medical History  Diagnosis Date  . Arthritis   . Bipolar 1 disorder   . Seasonal allergies   . Asthma   . Schizophrenia, paranoid type   . Chronic back pain   . Depression     reports that he has been smoking Cigarettes.  He has a 12.5 pack-year smoking history. He has never used smokeless tobacco. He reports that he uses illicit drugs (Cocaine and Marijuana). He reports that he does not drink alcohol. No family history on file.       Allergies:  No Known Allergies  ACT Assessment Complete:  No:   Past Psychiatric History: YES Diagnosis:  Bipolar, mixed and Substance Abuse Schizophrenia  Hospitalizations:  02/2013 @ [facility]  Outpatient Care:  [facility]  Substance Abuse Care:  [facility]  Self-Mutilation:  None  Suicidal Attempts:  None  Homicidal Behaviors:  None   Violent Behaviors:  None   Place of Residence:  Currently homeless; was living with father until 2 days ago Marital Status:  Single Employed/Unemployed:  Unemployed Education:  [employer] Family Supports:  Son Objective: Blood pressure 136/83, pulse 83, temperature 97.9 F (36.6 C), temperature source Oral, resp. rate 18, height $RemoveBe'5\' 9"'ZcIVOzPCu$  (1.753 m), weight 88.451 kg (195 lb), SpO2 97.00%.Body mass index is 28.78 kg/(m^2). Results for orders placed during the hospital encounter of 03/13/13 (from the past  72 hour(s))  URINE RAPID DRUG SCREEN (HOSP PERFORMED)     Status: Abnormal   Collection Time    03/13/13  7:00 PM      Result Value Range   Opiates NONE DETECTED  NONE DETECTED   Cocaine POSITIVE (*) NONE DETECTED   Benzodiazepines NONE DETECTED  NONE DETECTED   Amphetamines NONE DETECTED  NONE DETECTED   Tetrahydrocannabinol POSITIVE (*) NONE DETECTED   Barbiturates NONE DETECTED  NONE DETECTED   Comment:            DRUG SCREEN FOR MEDICAL PURPOSES     ONLY.  IF CONFIRMATION IS NEEDED     FOR ANY PURPOSE, NOTIFY LAB     WITHIN 5 DAYS.                 LOWEST DETECTABLE LIMITS     FOR URINE DRUG SCREEN     Drug Class       Cutoff (ng/mL)     Amphetamine      1000     Barbiturate      200     Benzodiazepine   970     Tricyclics       263     Opiates          300     Cocaine          300     THC              50  CBC     Status: None   Collection Time    03/13/13  7:31 PM      Result Value Range   WBC 10.2  4.0 - 10.5 K/uL   RBC 4.87  4.22 - 5.81 MIL/uL   Hemoglobin 15.7  13.0 - 17.0 g/dL   HCT 45.9  39.0 - 52.0 %   MCV 94.3  78.0 - 100.0 fL   MCH 32.2  26.0 - 34.0 pg   MCHC 34.2  30.0 - 36.0 g/dL   RDW 14.1  11.5 - 15.5 %   Platelets 379  150 - 400 K/uL  BASIC METABOLIC PANEL     Status: Abnormal   Collection Time    03/13/13  7:31 PM      Result Value Range   Sodium 140  137 - 147 mEq/L   Potassium 3.5 (*) 3.7 - 5.3 mEq/L   Chloride 102  96 - 112 mEq/L   CO2 24  19 - 32 mEq/L   Glucose, Bld 115 (*) 70 - 99 mg/dL   BUN 14  6 - 23 mg/dL   Creatinine, Ser 1.00  0.50 - 1.35 mg/dL   Calcium 9.1  8.4 - 10.5 mg/dL   GFR calc non Af Amer >90  >90 mL/min   GFR calc Af Amer >90  >90 mL/min   Comment: (NOTE)     The eGFR has been calculated using the CKD EPI equation.     This calculation has not been validated in all clinical situations.     eGFR's persistently <90 mL/min signify possible Chronic Kidney     Disease.  ETHANOL     Status: None   Collection Time    03/13/13  7:31 PM      Result Value Range   Alcohol, Ethyl (B) <11  0 - 11 mg/dL   Comment:            LOWEST DETECTABLE LIMIT FOR     SERUM ALCOHOL  IS 11 mg/dL     FOR MEDICAL PURPOSES ONLY  HEPATIC FUNCTION PANEL     Status: None   Collection Time    03/13/13  7:32 PM      Result Value Range   Total Protein 7.0  6.0 - 8.3 g/dL   Albumin 3.6  3.5 - 5.2 g/dL   AST 24  0 - 37 U/L   ALT 26  0 - 53 U/L   Alkaline Phosphatase 82  39 - 117 U/L   Total Bilirubin 0.5  0.3 - 1.2 mg/dL   Bilirubin, Direct <0.2  0.0 - 0.3 mg/dL   Indirect Bilirubin  NOT CALCULATED  0.3 - 0.9 mg/dL   Labs are reviewed and are pertinent for UDS positive for THC and cocaine, K+ 3.5 and glucose 115.  Current Facility-Administered Medications  Medication Dose Route Frequency Provider Last Rate Last Dose  . albuterol (PROVENTIL HFA;VENTOLIN HFA) 108 (90 BASE) MCG/ACT inhaler 2 puff  2 puff Inhalation Q6H PRN Maudry Diego, MD      . benztropine (COGENTIN) tablet 1 mg  1 mg Oral QHS Maudry Diego, MD   1 mg at 03/13/13 2146  . LORazepam (ATIVAN) tablet 1 mg  1 mg Oral Q4H PRN Maudry Diego, MD      . thiothixene (NAVANE) capsule 10 mg  10 mg Oral QHS Maudry Diego, MD   10 mg at 03/13/13 2146   Current Outpatient Prescriptions  Medication Sig Dispense Refill  . albuterol (PROAIR HFA) 108 (90 BASE) MCG/ACT inhaler Inhale 2 puffs into the lungs every 6 (six) hours as needed for wheezing or shortness of breath.      . benztropine (COGENTIN) 1 MG tablet Take 1 tablet (1 mg total) by mouth at bedtime.  30 tablet  0  . thiothixene (NAVANE) 10 MG capsule Take 1 capsule (10 mg total) by mouth at bedtime.  30 capsule  0    Psychiatric Specialty Exam:     Blood pressure 136/83, pulse 83, temperature 97.9 F (36.6 C), temperature source Oral, resp. rate 18, height $RemoveBe'5\' 9"'pgPYLBSVY$  (1.753 m), weight 88.451 kg (195 lb), SpO2 97.00%.Body mass index is 28.78 kg/(m^2).  General Appearance: Disheveled  Eye Sport and exercise psychologist::  Fair  Speech:  Clear and Coherent  Volume:  Decreased  Mood:  Depressed  Affect:  Depressed  Thought Process:  Coherent  Orientation:  Full (Time, Place, and Person)  Thought Content:  WDL  Suicidal Thoughts:  No  Homicidal Thoughts:  No  Memory:  Immediate;   Fair Recent;   Fair Remote;   Fair  Judgement:  Fair  Insight:  Fair  Psychomotor Activity:  Normal  Concentration:  Fair  Recall:  Fair  Akathisia:  No  Handed:  Right  AIMS (if indicated):     Assets:  Desire for Improvement  Sleep:      Treatment Plan Summary: Patient does not meet  criteria for inpatient treatment. Patient able to contract for safety. Patient may benefit from social work consult to assist with housing.  Disposition: 1.Patient to sign no harm contract prior to discharge from ED. 2.Follow-up with [facility] as previously scheduled. 3.Patient Beck be discharged when medically cleared.  Serena Colonel, FNP-BC 03/14/2013 5:08 AM  Reviewed the information documented and agree with the treatment plan.  Wesley Beck,JANARDHAHA R. 03/18/2013 6:03 PM

## 2013-03-14 NOTE — ED Notes (Signed)
Pt [facility].

## 2013-03-14 NOTE — ED Notes (Signed)
TTS completed. 

## 2013-03-14 NOTE — Discharge Instructions (Signed)
°Emergency Department Resource Guide °1) Find a Doctor and Pay Out of Pocket °Although you won't have to find out who is covered by your insurance plan, it is a good idea to ask around and get recommendations. You will then need to call the office and see if the doctor you have chosen will accept you as a new patient and what types of options they offer for patients who are self-pay. Some doctors offer discounts or will set up payment plans for their patients who do not have insurance, but you will need to ask so you aren't surprised when you get to your appointment. ° °2) Contact Your Local Health Department °Not all health departments have doctors that can see patients for sick visits, but many do, so it is worth a call to see if yours does. If you don't know where your local health department is, you can check in your phone book. The CDC also has a tool to help you locate your state's health department, and many state websites also have listings of all of their local health departments. ° °3) Find a Walk-in Clinic °If your illness is not likely to be very severe or complicated, you may want to try a walk in clinic. These are popping up all over the country in pharmacies, drugstores, and shopping centers. They're usually staffed by nurse practitioners or physician assistants that have been trained to treat common illnesses and complaints. They're usually fairly quick and inexpensive. However, if you have serious medical issues or chronic medical problems, these are probably not your best option. ° °No Primary Care Doctor: °- Call Health Connect at  832-8000 - they can help you locate a primary care doctor that  accepts your insurance, provides certain services, etc. °- Physician Referral Service- 1-800-533-3463 ° °Chronic Pain Problems: °Organization         Address  Phone   Notes  °[facility]  (336) 297-2271 Patients need to be referred by their primary care doctor.  ° °Medication  Assistance: °Organization         Address  Phone   Notes  °[place] [facility] [address] °[place], [place] [address] (336) 641-8030 --Must be a resident of [place] °-- Must have NO insurance coverage whatsoever (no Medicaid/ Medicare, etc.) °-- The pt. MUST have a primary care doctor that directs their care regularly and follows them in the community °  °[facility]  (866) 331-1348   °[employer]  (888) 892-1162   ° °Agencies that provide inexpensive medical care: °Organization         Address  Phone   Notes  °[facility]  (336) 832-8035   °[facility]    (336) 832-7272   °[facility] [address] °[place], [place] [address] (336) 832-4777   °[facility] [address], °[place] (336) 271-4999   °[facility]    (336) 373-0678   °[facility]    (336) 272-1050   °[facility] ° [address], [place] Phone:  (336) 832-4444, Fax:  (336) 832-4440 Hours of Operation:  9 am - 6 pm, M-F.  Also accepts Medicaid/Medicare and self-pay.  °[facility] ° [address], [place] Phone: (336) 832-3150, Fax: (336) [phone]. Hours of Operation:  8:30 am - 5:30 pm, M-F.  Also accepts Medicaid and self-pay.  °[facility] [place] [address], [place] Phone: (336) 878-6027   °[facility] [address], [place], [place] (336)723-1848, Ext. 123 Mondays & Thursdays: 7-9 AM.  First 15 patients are seen on a first come, first serve basis. °  ° °Medicaid-accepting [place] Providers: ° °Organization         Address  Phone   Notes  °[facility] [address], [place] (336) 641-2100 Also accepts self-pay patients.  °[facility] [address], [place] ° (336) 856-9996   °[facility] [address], [place]  (336) 288-8857   °[facility] [address] [place] [address], [place] (336) 299-7000   °Veita Bland [address], [place]  ° (336) 373-1557 Only accepts [place] Access Medicaid patients after they have their name applied to their card.  ° °Self-Pay (no insurance) in [place]: ° °Organization         Address  Phone   Notes  °Sickle Cell Patients, [facility] [address], [place] (336) 832-1970   °[facility] [address], [place] (336) 832-4400   °[facility] ° [address], [place] (336) 992-4800   °[facility]/Dr. Osei-Bonsu ° [address] [place] [address], [place] or [address], [place] (336) [phone] Phone number for both [place] and [place] locations is the same.  °[facility] [address], [place] (336) 299-0000   °[facility] [address] [place] [address], [place] or [address] (336) 852-7530 °(336) 878-2260   °[facility] [address], [place] (336) 350-1642, phone; (336) 294-5005, fax Sees patients 1st and 3rd Saturday of every month.  Must not qualify for public or private insurance (i.e. Medicaid, Medicare, [place] Health Choice, Veterans' Benefits) • Household income should be no more than 200% of the poverty level •The clinic cannot treat you if you are pregnant or think you are pregnant • Sexually transmitted diseases are not treated at the clinic.  ° ° °Dental Care: °Organization         Address  Phone  Notes  °[place] [facility] [address], [place] (336) 641-6152 Accepts children up to age 21 who are enrolled in Medicaid or [place] Health Choice; pregnant women with a Medicaid card; and children who have applied for Medicaid or [place] Health Choice, but were declined, whose parents can pay a reduced fee at time of service.  °[place]  [facility] [place]  [address], [place] (336) 641-7733 Accepts children up to age 21 who are enrolled in Medicaid or [place] Health Choice; pregnant women with a Medicaid card; and children who have applied for Medicaid or [place] Health Choice, but were declined, whose parents can pay a reduced fee at time of service.  °[facility] ° [address], [place] (336) 641-4533 Patients are seen by appointment only. Walk-ins are not accepted. [facility] will see patients 18 years of age and older. °Monday - Tuesday (8am-5pm) °Most Wednesdays (8:30-5pm) °$30 per visit, cash only  °[facility] ° [address], [place] (336) 641-4533 Patients are seen by appointment only. Walk-ins are not accepted. [facility] will see patients 18 years of age and older. °One   Wednesday Evening (Monthly: Volunteer Based).  $30 per visit, cash only  °[facility]  (919) 537-3737 for adults; Children under age 4, call Graduate Pediatric Dentistry at (919) [phone]. Children aged 4-14, please call (919) [phone] to request a pediatric application. ° Dental services are provided in all areas of dental care including fillings, crowns and bridges, complete and partial dentures, implants, gum treatment, root canals, and extractions. Preventive care is also provided. Treatment is provided to both adults and children. °Patients are selected via a lottery and there is often a waiting list. °  °[facility] [address], °[place] ° (336) 763-8833 [email or web address] °  °[facility] [address], [place], [place] (336)723-1848, Ext. 123 Second and Fourth Thursday of each month, opens at 6:30 AM; Clinic ends at 9 AM.  Patients are seen on a first-come first-served basis, and a limited number are seen during each clinic.  ° °[facility] ° [address], [place], [place] (336) 723-7904    Eligibility Requirements °You must have lived in [place], [place], or [place] counties for at least the last three months. °  You cannot be eligible for state or federal sponsored healthcare insurance, including Veterans Administration, Medicaid, or Medicare. °  You generally cannot be eligible for healthcare insurance through your employer.  °  How to apply: °Eligibility screenings are held every Tuesday and Wednesday afternoon from 1:00 pm until 4:00 pm. You do not need an appointment for the interview!  °[facility] [address], [place], [place] 336-631-2330   °[facility]  336-342-8273   °[facility]  336-703-3100   °[facility]  336-570-6415   ° °Behavioral Health Resources in the Community: °Intensive Outpatient Programs °Organization         Address  Phone  Notes  °[facility] [address], [place], [place] 336-878-6098   °[facility] [address], [place], [place] 336-832-9800   °[facility] [address], [place], [place] ° 336-882-2125   °[facility] [address],  °[place], [place] 1-800-853-5163 or 336-641-4981   °Substance Abuse Resources °Organization         Address  Phone  Notes  °[facility]  336-882-2125   °[facility]  336-784-9470   °[facility]  336-285-9073   °[facility]  336-845-3988   °Residential & Outpatient Substance Abuse Program  1-800-659-3381   °Psychological Services °Organization         Address  Phone  Notes  °[facility]  336- 832-9600   °[facility]  336- 378-7881   °[facility] [address], [place] 1-800-853-5163 or 336-641-4981   ° °Mobile Crisis Teams °Organization         Address  Phone  Notes  °[facility], Mobile Crisis Care Unit  1-877-626-1772   °Assertive °[facility] ° [address]  [place], [place] 336-834-9664   °Sharon DeEsch [address] °[place] 336-554-5454   ° °Self-Help/Support Groups °Organization         Address  Phone             Notes  °[facility] - variety of support groups  336- 373-1402 Call for more information  °Narcotics Anonymous (NA), [facility] [address], °[place]   2 meetings at this location  ° °  Residential Treatment Programs °Organization         Address  Phone  Notes  °[facility] [address],    °[place]  1-866-801-8205   °[facility] ° [address], [place], [place] 704-293-8524   °[facility] [address], [place] 336-845-3988 Admissions: 8am-3pm M-F  °[facility] [address]    °[place], [place] 336-841-1104   °[facility] [address], [place], [place] 336-379-7146   °[facility] [address]  °[place], [place] 336-285-9073   °[facility] Intensive Outpatient [address], [place], [place] 336-852-3033   °[facility] [address]  °[place], [place] 1-877-615-2722 or 336-784-9470   °[facility] [address] [place], [place] 336-227-7417 Accepts Medicaid  °[facility] [address]  °[place] 1-800-659-3381 Substance Abuse/Addiction Treatment  ° °[place] Behavioral Health Resources °Organization         Address  Phone  Notes  °[facility]  (888) 581-9988   °Julie Brannon, PhD [address] [place], [place]   (336) 349-5553 or (336) 951-0000   °[facility]   [address] °[place], [place] (336) 349-4454   °[facility] [address], [place], [place] (336) 342-8316 Insurance/Medicaid/sponsorship through [facility]  °[facility] [address]                                    [place], [place] (336) 342-8316 Therapy/tele-psych/case    °[facility] [address]  ° [place], [place] (336) 349-2233    °Dr. Arfeen  (336) 349-4544   °[facility] [place]  [employer] [place] [facility] 1) [address], [place] °2) [address], [place] °3)  [address], [place] (336) 349-3220 °(336) 342-7768 ° °(336) 342-8140   °[place] Child Abuse [facility]line (336) 342-1394 or (336) 342-3537 (After Hours)    ° ° °

## 2013-03-14 NOTE — BH Assessment (Signed)
Assessment Note  Wesley Beck is a 43 y.o. male who presents voluntarily to [facility], c/o SI, no plan to harm self. Pt says he's been having SI thoughts for 3 days. Pt was recently d/[facility]'d from [facility] on approx 02/28/13 for SI/Depression.  Pt told this Clinical research associatewriter he was living at his father's home and was asked to leave and he has no where to go because he used his car without permission.  Pt states he tried going to a homeless shelter, but it was too late and he wasn't able to get a bed for the night. Pt says he has no friends or family to help him--"I burned a lot of bridges".  Pt says--"I'm in a bad situation and if I can't get out, I want to die".  Pt reports stressors: (1) homelessness, (2) financial, (3) unemployment, (4) "I have an 42 yr old son, I can't take of", (5) legal charges--court date 03/27/13, unauthorized use of a motor vehicle.  This Clinical research associatewriter asked pt if he had a place to go or shelter would he continue to endorse SI, he told this Clinical research associatewriter no.  Pt admits using 1 gram of cocaine, weekly, last use was 1 week ago; he also uses 1.5 grams of thc, weekly, last use was 1 week ago.  Pt denies HI/AVH.  This Clinical research associatewriter spoke with Susann GivensFran Hobson,NP and requested psych eval for possible d/c.     Axis I: Bipolar D/O I; Cannabis Use D/O; Cocaine Use D/O  Axis II: Deferred Axis III:  Past Medical History  Diagnosis Date  . Arthritis   . Bipolar 1 disorder   . Seasonal allergies   . Asthma   . Schizophrenia, paranoid type   . Chronic back pain   . Depression    Axis IV: economic problems, housing problems, occupational problems, other psychosocial or environmental problems, problems related to legal system/crime, problems related to social environment and problems with primary support group Axis V: 41-50 serious symptoms  Past Medical History:  Past Medical History  Diagnosis Date  . Arthritis   . Bipolar 1 disorder   . Seasonal allergies   . Asthma   . Schizophrenia, paranoid type   .  Chronic back pain   . Depression     History reviewed. No pertinent past surgical history.  Family History: No family history on file.  Social History:  reports that he has been smoking Cigarettes.  He has a 12.5 pack-year smoking history. He has never used smokeless tobacco. He reports that he uses illicit drugs (Cocaine and Marijuana). He reports that he does not drink alcohol.  Additional Social History:  Alcohol / Drug Use Pain Medications: See MAR  Prescriptions: See MAR  Over the Counter: See MAR  History of alcohol / drug use?: Yes Longest period of sobriety (when/how long): None  Negative Consequences of Use: Work / Web designerchool;Personal relationships;Legal;Financial Withdrawal Symptoms: Other (Comment) (No current w/d sxs ) Substance #1 Name of Substance 1: THC  1 - Age of First Use: Teens  1 - Amount (size/oz): 1.5 Grams  1 - Frequency: Weekly  1 - Duration: On-going  1 - Last Use / Amount: Last weekend  Substance #2 Name of Substance 2: Cocaine  2 - Age of First Use: Teens  2 - Amount (size/oz): 1 Gram  2 - Frequency: Weekly  2 - Duration: On-going  2 - Last Use / Amount: Last Weekend   CIWA: CIWA-Ar BP: 136/83 mmHg Pulse Rate: 83 COWS:  Allergies: No Known Allergies  Home Medications:  (Not in a hospital admission)  OB/GYN Status:  No LMP for male patient.  General Assessment Data Location of Assessment: [facility] Is this a Tele or Face-to-Face Assessment?: Tele Assessment Is this an Initial Assessment or a Re-assessment for this encounter?: Initial Assessment Living Arrangements: Other (Comment) (Homeless ) Can pt return to current living arrangement?: Yes Admission Status: Voluntary Is patient capable of signing voluntary admission?: Yes Transfer from: Acute Hospital Referral Source: MD  Medical Screening Exam [facility] Walk-in ONLY) Medical Exam completed: No Reason for MSE not completed: Other: (None )  [facility] Crisis Care Plan Living Arrangements: Other  (Comment) (Homeless ) Name of Psychiatrist: None  Name of Therapist: None   Education Status Is patient currently in school?: No Current Grade: None  Highest grade of school patient has completed: GED Name of school: None  Contact person: None   Risk to self Suicidal Ideation: Yes-Currently Present Suicidal Intent: No Is patient at risk for suicide?: No Suicidal Plan?: No Access to Means: Yes Specify Access to Suicidal Means: Meds, Sharps  What has been your use of drugs/alcohol within the last 12 months?: Abusing: cocaine, THC  Previous Attempts/Gestures: Yes How many times?: 1 (By overdose ) Other Self Harm Risks: None  Triggers for Past Attempts: Unpredictable Intentional Self Injurious Behavior: None Family Suicide History: No Recent stressful life event(s): Financial Problems;Legal Issues;Conflict (Comment);Other (Comment) (Employment, Homeless ) Persecutory voices/beliefs?: No Depression: No Depression Symptoms:  (None reported ) Substance abuse history and/or treatment for substance abuse?: Yes Suicide prevention information given to non-admitted patients: Not applicable  Risk to Others Homicidal Ideation: No Thoughts of Harm to Others: No Current Homicidal Intent: No Current Homicidal Plan: No Access to Homicidal Means: No Identified Victim: None  History of harm to others?: No Assessment of Violence: None Noted Violent Behavior Description: None  Does patient have access to weapons?: No Criminal Charges Pending?: Yes Describe Pending Criminal Charges: Unauthorized use of a motor vehicle  Does patient have a court date: Yes Court Date: 03/27/13  Psychosis Hallucinations: None noted Delusions: None noted  Mental Status Report Appear/Hygiene: Disheveled Eye Contact: Fair Motor Activity: Unremarkable Speech: Logical/coherent;Soft Level of Consciousness: Alert Mood: Sad Affect: Appropriate to circumstance;Sad Anxiety Level: None Thought Processes:  Coherent;Relevant Judgement: Impaired Orientation: Person;Place;Time;Situation Obsessive Compulsive Thoughts/Behaviors: None  Cognitive Functioning Concentration: Normal Memory: Recent Intact;Remote Intact IQ: Average Insight: Fair Impulse Control: Fair Appetite: Good Weight Loss: 0 Weight Gain: 0 Sleep: Increased Total Hours of Sleep: 5 Vegetative Symptoms: None  ADLScreening [facility] Assessment Services) Patient's cognitive ability adequate to safely complete daily activities?: Yes Patient able to express need for assistance with ADLs?: Yes Independently performs ADLs?: Yes (appropriate for developmental age)  Prior Inpatient Therapy Prior Inpatient Therapy: Yes Prior Therapy Dates: 2015,2008,2002,2001 Prior Therapy Facilty/Provider(s): [facility]  Reason for Treatment: Bipolar D/O   Prior Outpatient Therapy Prior Outpatient Therapy: No Prior Therapy Dates: None  Prior Therapy Facilty/Provider(s): None  Reason for Treatment: None   ADL Screening (condition at time of admission) Patient's cognitive ability adequate to safely complete daily activities?: Yes Is the patient deaf or have difficulty hearing?: No Does the patient have difficulty seeing, even when wearing glasses/contacts?: No Does the patient have difficulty concentrating, remembering, or making decisions?: No Patient able to express need for assistance with ADLs?: Yes Does the patient have difficulty dressing or bathing?: No Independently performs ADLs?: Yes (appropriate for developmental age) Does the patient have difficulty walking or climbing stairs?: No  Weakness of Legs: None Weakness of Arms/Hands: None  Home Assistive Devices/Equipment Home Assistive Devices/Equipment: None  Therapy Consults (therapy consults require a physician order) PT Evaluation Needed: No OT Evalulation Needed: No SLP Evaluation Needed: No Abuse/Neglect Assessment (Assessment to be complete while patient is alone) Physical Abuse:  Denies Verbal Abuse: Denies Sexual Abuse: Denies Exploitation of patient/patient's resources: Denies Self-Neglect: Denies Values / Beliefs Cultural Requests During Hospitalization: None Spiritual Requests During Hospitalization: None Consults Spiritual Care Consult Needed: No Social Work Consult Needed: No Merchant navy officer (For Healthcare) Advance Directive: Patient does not have advance directive;Patient would not like information Pre-existing out of facility DNR order (yellow form or pink MOST form): No Nutrition Screen- [facility] Adult/[facility] Patient's home diet: Regular  Additional Information 1:1 In Past 12 Months?: No CIRT Risk: No Elopement Risk: No Does patient have medical clearance?: Yes     Disposition:  Disposition Initial Assessment Completed for this Encounter: Yes Disposition of Patient: Referred to (Psych eval by extender ) Type of inpatient treatment program: Adult Patient referred to: Other (Comment) (Psych Eval by extender for final disposition )  On Site Evaluation by:   Reviewed with Physician:    Murrell Redden 03/14/2013 5:16 AM

## 2013-03-14 NOTE — ED Provider Notes (Signed)
6:06 AM Patient seen and evaluated by the psychiatry team.  The patient was thought to be safe for discharge home.  They do not believe the patient is a threat to himself or to others at this time.  Outpatient resources given regarding homeless shelters.  Please see psychiatry consultation note for complete details.  Lyanne CoKevin M Ediel Unangst, MD 03/14/13 [phone]

## 2013-03-14 NOTE — BHH Counselor (Signed)
Psych eval completed by Alberteen SamFran Hobson, NP.  Pt will d/c in the AM, extender is requesting social work intervene and assist with housing/shelter.  This Clinical research associatewriter talked with Dr. Patria Maneampos, he agreed with disposition.  This Lobbyistwriter faxed safety contract to [facility].  Pt is contracting for safety.

## 2013-05-26 ENCOUNTER — Emergency Department (HOSPITAL_COMMUNITY)
Admission: EM | Admit: 2013-05-26 | Discharge: 2013-05-27 | Disposition: A | Discharge status: Home / Self Care | Payer: MEDICAID | Attending: Emergency Medicine | Admitting: Emergency Medicine

## 2013-05-26 ENCOUNTER — Encounter (HOSPITAL_COMMUNITY): Payer: Self-pay | Admitting: Emergency Medicine

## 2013-05-26 ENCOUNTER — Emergency Department (HOSPITAL_COMMUNITY): Payer: MEDICAID

## 2013-05-26 DIAGNOSIS — R5383 Other fatigue: Secondary | ICD-10-CM

## 2013-05-26 DIAGNOSIS — F319 Bipolar disorder, unspecified: Secondary | ICD-10-CM | POA: Insufficient documentation

## 2013-05-26 DIAGNOSIS — F17200 Nicotine dependence, unspecified, uncomplicated: Secondary | ICD-10-CM | POA: Insufficient documentation

## 2013-05-26 DIAGNOSIS — F200 Paranoid schizophrenia: Secondary | ICD-10-CM | POA: Insufficient documentation

## 2013-05-26 DIAGNOSIS — G8929 Other chronic pain: Secondary | ICD-10-CM | POA: Insufficient documentation

## 2013-05-26 DIAGNOSIS — F1210 Cannabis abuse, uncomplicated: Secondary | ICD-10-CM | POA: Insufficient documentation

## 2013-05-26 DIAGNOSIS — F329 Major depressive disorder, single episode, unspecified: Secondary | ICD-10-CM

## 2013-05-26 DIAGNOSIS — R5381 Other malaise: Secondary | ICD-10-CM | POA: Insufficient documentation

## 2013-05-26 DIAGNOSIS — R45851 Suicidal ideations: Secondary | ICD-10-CM | POA: Insufficient documentation

## 2013-05-26 DIAGNOSIS — J45901 Unspecified asthma with (acute) exacerbation: Secondary | ICD-10-CM | POA: Insufficient documentation

## 2013-05-26 DIAGNOSIS — M129 Arthropathy, unspecified: Secondary | ICD-10-CM | POA: Insufficient documentation

## 2013-05-26 DIAGNOSIS — Z79899 Other long term (current) drug therapy: Secondary | ICD-10-CM | POA: Insufficient documentation

## 2013-05-26 DIAGNOSIS — R110 Nausea: Secondary | ICD-10-CM | POA: Insufficient documentation

## 2013-05-26 DIAGNOSIS — J209 Acute bronchitis, unspecified: Secondary | ICD-10-CM

## 2013-05-26 DIAGNOSIS — F32A Depression, unspecified: Secondary | ICD-10-CM

## 2013-05-26 DIAGNOSIS — J029 Acute pharyngitis, unspecified: Secondary | ICD-10-CM | POA: Insufficient documentation

## 2013-05-26 DIAGNOSIS — R1084 Generalized abdominal pain: Secondary | ICD-10-CM | POA: Insufficient documentation

## 2013-05-26 DIAGNOSIS — R197 Diarrhea, unspecified: Secondary | ICD-10-CM | POA: Insufficient documentation

## 2013-05-26 LAB — CBC WITH DIFFERENTIAL/PLATELET
Basophils Absolute: 0.1 10*3/uL (ref 0.0–0.1)
Basophils Relative: 1 % (ref 0–1)
EOS PCT: 10 % — AB (ref 0–5)
Eosinophils Absolute: 0.9 10*3/uL — ABNORMAL HIGH (ref 0.0–0.7)
HEMATOCRIT: 44.8 % (ref 39.0–52.0)
HEMOGLOBIN: 15.4 g/dL (ref 13.0–17.0)
LYMPHS ABS: 2.3 10*3/uL (ref 0.7–4.0)
Lymphocytes Relative: 27 % (ref 12–46)
MCH: 32.3 pg (ref 26.0–34.0)
MCHC: 34.4 g/dL (ref 30.0–36.0)
MCV: 93.9 fL (ref 78.0–100.0)
MONOS PCT: 14 % — AB (ref 3–12)
Monocytes Absolute: 1.2 10*3/uL — ABNORMAL HIGH (ref 0.1–1.0)
Neutro Abs: 4.1 10*3/uL (ref 1.7–7.7)
Neutrophils Relative %: 48 % (ref 43–77)
PLATELETS: 288 10*3/uL (ref 150–400)
RBC: 4.77 MIL/uL (ref 4.22–5.81)
RDW: 13.4 % (ref 11.5–15.5)
WBC: 8.5 10*3/uL (ref 4.0–10.5)

## 2013-05-26 LAB — BASIC METABOLIC PANEL
BUN: 11 mg/dL (ref 6–23)
CALCIUM: 9.3 mg/dL (ref 8.4–10.5)
CHLORIDE: 103 meq/L (ref 96–112)
CO2: 25 meq/L (ref 19–32)
CREATININE: 0.98 mg/dL (ref 0.50–1.35)
GFR calc Af Amer: >90 mL/min (ref >90)
GFR calc non Af Amer: >90 mL/min (ref >90)
Glucose, Bld: 92 mg/dL (ref 70–99)
Potassium: 3.7 mEq/L (ref 3.7–5.3)
Sodium: 141 mEq/L (ref 137–147)

## 2013-05-26 LAB — RAPID URINE DRUG SCREEN, HOSP PERFORMED
AMPHETAMINES: NOT DETECTED
Barbiturates: NOT DETECTED
Benzodiazepines: NOT DETECTED
Cocaine: NOT DETECTED
Opiates: NOT DETECTED
TETRAHYDROCANNABINOL: POSITIVE — AB

## 2013-05-26 LAB — ETHANOL: Alcohol, Ethyl (B): <11 mg/dL (ref 0–11)

## 2013-05-26 MED ORDER — DM-GUAIFENESIN ER 30-600 MG PO TB12
1.0000 | ORAL_TABLET | Freq: Two times a day (BID) | ORAL | Status: DC
  Administered 2013-05-26 – 2013-05-27 (×2): 1 via ORAL
  Filled 2013-05-26 (×2): qty 1

## 2013-05-26 MED ORDER — PREDNISONE 50 MG PO TABS: 60.0000 mg | ORAL_TABLET | Freq: Once | ORAL | Status: DC

## 2013-05-26 MED ORDER — ALBUTEROL SULFATE (2.5 MG/3ML) 0.083% IN NEBU: 5.0000 mg | INHALATION_SOLUTION | Freq: Once | RESPIRATORY_TRACT | Status: DC

## 2013-05-26 MED ORDER — ALUM & MAG HYDROXIDE-SIMETH 200-200-20 MG/5ML PO SUSP: 30.0000 mL | ORAL | Status: DC | PRN

## 2013-05-26 MED ORDER — PREDNISONE 20 MG PO TABS: 40.0000 mg | ORAL_TABLET | Freq: Once | ORAL | Status: DC

## 2013-05-26 MED ORDER — ACETAMINOPHEN 325 MG PO TABS: 650.0000 mg | ORAL_TABLET | ORAL | Status: DC | PRN

## 2013-05-26 MED ORDER — THIOTHIXENE 5 MG PO CAPS
10.0000 mg | ORAL_CAPSULE | Freq: Every day | ORAL | Status: DC
  Filled 2013-05-26 (×2): qty 1

## 2013-05-26 MED ORDER — BENZTROPINE MESYLATE 1 MG PO TABS
1.0000 mg | ORAL_TABLET | Freq: Every day | ORAL | Status: DC
  Filled 2013-05-26: qty 1

## 2013-05-26 MED ORDER — NICOTINE 21 MG/24HR TD PT24
21.0000 mg | MEDICATED_PATCH | Freq: Every day | TRANSDERMAL | Status: DC
  Administered 2013-05-27: 21 mg via TRANSDERMAL
  Filled 2013-05-26: qty 1

## 2013-05-26 MED ORDER — ZOLPIDEM TARTRATE 5 MG PO TABS: 10.0000 mg | ORAL_TABLET | Freq: Every evening | ORAL | Status: DC | PRN

## 2013-05-26 MED ORDER — IPRATROPIUM BROMIDE 0.02 % IN SOLN
0.5000 mg | Freq: Once | RESPIRATORY_TRACT | Status: AC
  Administered 2013-05-27: 0.5 mg via RESPIRATORY_TRACT
  Filled 2013-05-26: qty 2.5

## 2013-05-26 MED ORDER — ALBUTEROL SULFATE HFA 108 (90 BASE) MCG/ACT IN AERS
2.0000 | INHALATION_SPRAY | Freq: Four times a day (QID) | RESPIRATORY_TRACT | Status: DC | PRN
  Administered 2013-05-27: 2 via RESPIRATORY_TRACT
  Filled 2013-05-26: qty 6.7

## 2013-05-26 MED ORDER — ONDANSETRON HCL 4 MG PO TABS: 4.0000 mg | ORAL_TABLET | Freq: Three times a day (TID) | ORAL | Status: DC | PRN

## 2013-05-26 MED ORDER — ALBUTEROL (5 MG/ML) CONTINUOUS INHALATION SOLN
15.0000 mg/h | INHALATION_SOLUTION | Freq: Once | RESPIRATORY_TRACT | Status: AC
  Administered 2013-05-27: 15 mg/h via RESPIRATORY_TRACT
  Filled 2013-05-26: qty 20

## 2013-05-26 MED ORDER — IPRATROPIUM BROMIDE 0.02 % IN SOLN
0.5000 mg | Freq: Once | RESPIRATORY_TRACT | Status: DC
Start: 2013-05-26 — End: 2013-05-26

## 2013-05-26 MED ORDER — ALBUTEROL SULFATE (2.5 MG/3ML) 0.083% IN NEBU
2.5000 mg | INHALATION_SOLUTION | Freq: Once | RESPIRATORY_TRACT | Status: AC
  Administered 2013-05-26: 2.5 mg via RESPIRATORY_TRACT
  Filled 2013-05-26: qty 3

## 2013-05-26 MED ORDER — IBUPROFEN 400 MG PO TABS: 600.0000 mg | ORAL_TABLET | Freq: Three times a day (TID) | ORAL | Status: DC | PRN

## 2013-05-26 MED ORDER — PREDNISONE 50 MG PO TABS
60.0000 mg | ORAL_TABLET | Freq: Once | ORAL | Status: AC
  Administered 2013-05-26: 60 mg via ORAL
  Filled 2013-05-26 (×2): qty 1

## 2013-05-26 MED ORDER — LORAZEPAM 1 MG PO TABS: 1.0000 mg | ORAL_TABLET | Freq: Three times a day (TID) | ORAL | Status: DC | PRN

## 2013-05-26 MED ORDER — LORATADINE 10 MG PO TABS
10.0000 mg | ORAL_TABLET | Freq: Every day | ORAL | Status: DC
  Administered 2013-05-26 – 2013-05-27 (×2): 10 mg via ORAL
  Filled 2013-05-26 (×2): qty 1

## 2013-05-26 MED ORDER — IPRATROPIUM-ALBUTEROL 0.5-2.5 (3) MG/3ML IN SOLN
3.0000 mL | Freq: Once | RESPIRATORY_TRACT | Status: AC
  Administered 2013-05-26: 3 mL via RESPIRATORY_TRACT
  Filled 2013-05-26: qty 3

## 2013-05-26 NOTE — ED Notes (Signed)
Pt stating "I don't want to continue to live this way". Denies any plan to harm himself. Pt reports being depressed and upset with current living conditions. States he is no longer receiving his social security check and is having difficulties with family support. Reports history of depression and being "committed".

## 2013-05-26 NOTE — ED Provider Notes (Signed)
CSN: [id number]     Arrival date & time 05/26/13  2016 History  This chart was scribed for Ward GivensIva L Brytni Dray, MD by Jarvis Morganaylor Ferguson, ED Scribe. This patient was seen in room [facility] and the patient's care was started at 9:07 PM.    Chief Complaint  Patient presents with  . V70.1  . Wheezing    The history is provided by the patient. No language interpreter was used.    HPI Comments: Wesley Beck is a 43 y.o. Male with a history of asthma, brought in by ambulance, who presents to the Emergency Department complaining of SOB with associated wheezing onset earlier tonight. He states that he believes he had a typical asthma attack tonight. He states that he was out of his prescribed inhaler, but that he was given a breathing treatment by EMS which offered moderate relief.  He also reports an associated cough, productive of brown sputum, a mild sore throat, clear rhinorrhea, sneezing, nausea and fatigue.  He has a secondary complaint of having watery episodes of diarrhea over the past 2 weeks. He also states that he has been having intermittent, mild generalized abdominal pain. He states that he has not been on antibiotics recently. He denies any suspicious food intakes.   He has a history of bipolar 1 disorder, paranoid schizophrenia and depression and has an additional complaint of SI over the past few days. As for a plan, he states that he would probably provoke his step-brother to kill him, since his step-brother already has stated that he wants to kill him. He states that he is homeless. He also states that he uses [place] and smokes about 0.5 packs/day. He denies drinking. He states that he was recently taken off of disability because he served a year in prison.   PCP Dr Janna Archondiego  Past Medical History  Diagnosis Date  . Arthritis   . Bipolar 1 disorder   . Seasonal allergies   . Asthma   . Schizophrenia, paranoid type   . Chronic back pain   . Depression    History reviewed. No  pertinent past surgical history. History reviewed. No pertinent family history. History  Substance Use Topics  . Smoking status: Current Every Day Smoker -- 0.50 packs/day for 25 years    Types: Cigarettes  . Smokeless tobacco: Never Used  . Alcohol Use: No  pt states he lives under a bridge He denies street drugs Pt was taken off disability after he was in prison for over a year  Review of Systems  Constitutional: Positive for fatigue.  HENT: Positive for rhinorrhea, sneezing and sore throat.   Respiratory: Positive for cough, shortness of breath and wheezing.   Gastrointestinal: Positive for nausea, abdominal pain and diarrhea. Negative for vomiting.  Psychiatric/Behavioral: Positive for suicidal ideas.  All other systems reviewed and are negative.   Allergies  Review of patient's allergies indicates no known allergies.  Home Medications   Prior to Admission medications   Medication Sig Start Date End Date Taking? Authorizing Provider  albuterol (PROAIR HFA) 108 (90 BASE) MCG/ACT inhaler Inhale 2 puffs into the lungs every 6 (six) hours as needed for wheezing or shortness of breath.    Historical Provider, MD  benztropine (COGENTIN) 1 MG tablet Take 1 tablet (1 mg total) by mouth at bedtime. 03/03/13   Beau FannyJohn C Withrow, FNP  thiothixene (NAVANE) 10 MG capsule Take 1 capsule (10 mg total) by mouth at bedtime. 03/03/13   Beau FannyJohn C Withrow, FNP  Triage Vitals: BP 137/77  Pulse 69  Temp(Src) 98.7 F (37.1 C)  Resp 16  Ht 5\' 9"  (1.753 m)  Wt 205 lb (92.987 kg)  BMI 30.26 kg/m2  SpO2 98%  Vital signs normal    Physical Exam  Nursing note and vitals reviewed. Constitutional: He is oriented to person, place, and time. He appears well-developed and well-nourished.  Non-toxic appearance. He does not appear ill. No distress.  HENT:  Head: Normocephalic and atraumatic.  Right Ear: External ear normal.  Left Ear: External ear normal.  Nose: Nose normal. No mucosal edema or  rhinorrhea.  Mouth/Throat: Oropharynx is clear and moist and mucous membranes are normal. No dental abscesses or uvula swelling.  Eyes: Conjunctivae and EOM are normal. Pupils are equal, round, and reactive to light.  Neck: Normal range of motion and full passive range of motion without pain. Neck supple.  Cardiovascular: Normal rate, regular rhythm and normal heart sounds.  Exam reveals no gallop and no friction rub.   No murmur heard. Pulmonary/Chest: Effort normal. No respiratory distress. He has wheezes. He has no rhonchi. He has no rales. He exhibits no tenderness and no crepitus.  Diffuse rhonchi with some wheezing.   Abdominal: Soft. Normal appearance and bowel sounds are normal. He exhibits no distension. There is no tenderness. There is no rebound and no guarding.  Musculoskeletal: Normal range of motion. He exhibits no edema and no tenderness.  Moves all extremities well.   Neurological: He is alert and oriented to person, place, and time. He has normal strength. No cranial nerve deficit.  Skin: Skin is warm, dry and intact. No rash noted. No erythema. No pallor.  Psychiatric: He has a normal mood and affect. His speech is normal and behavior is normal. His mood appears not anxious.     ED Course  Procedures (including critical care time)  Medications  dextromethorphan-guaiFENesin (MUCINEX DM) 30-600 MG per 12 hr tablet 1 tablet (1 tablet Oral Given 05/26/13 2207)  loratadine (CLARITIN) tablet 10 mg (10 mg Oral Given 05/26/13 2207)  benztropine (COGENTIN) tablet 1 mg (1 mg Oral Not Given 05/27/13 0100)  thiothixene (NAVANE) capsule 10 mg (10 mg Oral Not Given 05/27/13 0059)  albuterol (PROVENTIL HFA;VENTOLIN HFA) 108 (90 BASE) MCG/ACT inhaler 2 puff (not administered)  LORazepam (ATIVAN) tablet 1 mg (not administered)  acetaminophen (TYLENOL) tablet 650 mg (not administered)  ibuprofen (ADVIL,MOTRIN) tablet 600 mg (not administered)  zolpidem (AMBIEN) tablet 10 mg (not  administered)  nicotine (NICODERM CQ - dosed in mg/24 hours) patch 21 mg (not administered)  ondansetron (ZOFRAN) tablet 4 mg (not administered)  alum & mag hydroxide-simeth (MAALOX/MYLANTA) 200-200-20 MG/5ML suspension 30 mL (not administered)  predniSONE (DELTASONE) tablet 60 mg (not administered)  predniSONE (DELTASONE) tablet 40 mg (not administered)  predniSONE (DELTASONE) tablet 40 mg (not administered)  predniSONE (DELTASONE) tablet 60 mg (60 mg Oral Given 05/26/13 2208)  albuterol (PROVENTIL) (2.5 MG/3ML) 0.083% nebulizer solution 2.5 mg (2.5 mg Nebulization Given 05/26/13 2136)  ipratropium-albuterol (DUONEB) 0.5-2.5 (3) MG/3ML nebulizer solution 3 mL (3 mLs Nebulization Given 05/26/13 2136)  albuterol (PROVENTIL,VENTOLIN) solution continuous neb (15 mg/hr Nebulization Given 05/27/13 0047)  ipratropium (ATROVENT) nebulizer solution 0.5 mg (0.5 mg Nebulization Given 05/27/13 0047)    DIAGNOSTIC STUDIES: Oxygen Saturation is 98% on RA, normal by my interpretation.    COORDINATION OF CARE: 9:15 PM- Discussed plan to order an additional breathing treatment. Pt advised of plan for treatment and pt agrees.  23:50 on recheck patient still has  diffuse wheezing and rhonchi. A continuous nebulizer was ordered with 15 mg of albuterol and 0.5 mg of Atrovent.  TSS consult pending  Labs Review Results for orders placed during the hospital encounter of 05/26/13  CBC WITH DIFFERENTIAL      Result Value Ref Range   WBC 8.5  4.0 - 10.5 K/uL   RBC 4.77  4.22 - 5.81 MIL/uL   Hemoglobin 15.4  13.0 - 17.0 g/dL   HCT 16.144.8  09.639.0 - 04.552.0 %   MCV 93.9  78.0 - 100.0 fL   MCH 32.3  26.0 - 34.0 pg   MCHC 34.4  30.0 - 36.0 g/dL   RDW 40.913.4  81.111.5 - 91.415.5 %   Platelets 288  150 - 400 K/uL   Neutrophils Relative % 48  43 - 77 %   Neutro Abs 4.1  1.7 - 7.7 K/uL   Lymphocytes Relative 27  12 - 46 %   Lymphs Abs 2.3  0.7 - 4.0 K/uL   Monocytes Relative 14 (*) 3 - 12 %   Monocytes Absolute 1.2 (*) 0.1 - 1.0  K/uL   Eosinophils Relative 10 (*) 0 - 5 %   Eosinophils Absolute 0.9 (*) 0.0 - 0.7 K/uL   Basophils Relative 1  0 - 1 %   Basophils Absolute 0.1  0.0 - 0.1 K/uL  ETHANOL      Result Value Ref Range   Alcohol, Ethyl (B) <11  0 - 11 mg/dL  BASIC METABOLIC PANEL      Result Value Ref Range   Sodium 141  137 - 147 mEq/L   Potassium 3.7  3.7 - 5.3 mEq/L   Chloride 103  96 - 112 mEq/L   CO2 25  19 - 32 mEq/L   Glucose, Bld 92  70 - 99 mg/dL   BUN 11  6 - 23 mg/dL   Creatinine, Ser 7.820.98  0.50 - 1.35 mg/dL   Calcium 9.3  8.4 - 95.610.5 mg/dL   GFR calc non Af Amer >90  >90 mL/min   GFR calc Af Amer >90  >90 mL/min  URINE RAPID DRUG SCREEN (HOSP PERFORMED)      Result Value Ref Range   Opiates NONE DETECTED  NONE DETECTED   Cocaine NONE DETECTED  NONE DETECTED   Benzodiazepines NONE DETECTED  NONE DETECTED   Amphetamines NONE DETECTED  NONE DETECTED   Tetrahydrocannabinol POSITIVE (*) NONE DETECTED   Barbiturates NONE DETECTED  NONE DETECTED    Laboratory interpretation all normal except +UDS   Dg Chest 2 View  05/26/2013   CLINICAL DATA:  Cough and wheezing  EXAM: CHEST  2 VIEW  COMPARISON:  Prior chest x-ray 07/31/2012  FINDINGS: Stable cardiac and mediastinal contours. No focal airspace consolidation. Chronic bronchitic changes similar compared to prior. Mild pulmonary hyperexpansion. No acute osseous abnormality. No edema, pleural effusion or pneumothorax.  IMPRESSION: No active cardiopulmonary disease.  Stable hyperexpansion and central bronchitic changes as can be seen in the setting of chronic asthma.   Electronically Signed   By: Malachy MoanHeath  McCullough M.D.   On: 05/26/2013 22:43          EKG Interpretation None      MDM   Final diagnoses:  Depression  Suicide ideation  Bronchitis with bronchospasm   Disposition pending  Devoria AlbeIva Lleyton Byers, MD, FACEP   I personally performed the services described in this documentation, which was scribed in my presence. The recorded  information has been reviewed and considered.  Devoria Albe, MD, FACEP   Ward Givens, MD 05/27/13 [phone]

## 2013-05-26 NOTE — ED Notes (Signed)
Per EMS: called out for SOB; pt wheezing and given albuterol treatment en route.  Pt reported he was SI to EMS.

## 2013-05-26 NOTE — ED Notes (Signed)
MD at bedside. 

## 2013-05-27 MED ORDER — THIOTHIXENE 2 MG PO CAPS
ORAL_CAPSULE | ORAL | Status: AC
  Filled 2013-05-27: qty 5

## 2013-05-27 MED ORDER — ALBUTEROL SULFATE HFA 108 (90 BASE) MCG/ACT IN AERS: 2.0000 | INHALATION_SPRAY | RESPIRATORY_TRACT | Status: DC | PRN

## 2013-05-27 MED ORDER — DEXAMETHASONE 4 MG PO TABS
12.0000 mg | ORAL_TABLET | Freq: Once | ORAL | Status: AC
  Administered 2013-05-27: 12 mg via ORAL
  Filled 2013-05-27: qty 3
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
## 2013-05-27 NOTE — ED Provider Notes (Signed)
Following the nebulizer treatment, lungs are clear he is sleeping quietly. TTS consult is appreciated. Patient does not meet criteria for psychiatric admission. They'll be providing resources for him and that you will need to be discharged in the morning.  Dione Boozeavid Tyrann Donaho, MD 05/27/13 [phone]

## 2013-05-27 NOTE — ED Notes (Signed)
TTS at bedside. 

## 2013-05-27 NOTE — Clinical Social Work Note (Signed)
Patient can have a hospital discharge appointment tomorrow at [facility], [place].  Arrive 7:45 - 11 AM [facility] [place].  [facility] staff will assess patient for additional help which may include community support team to address psychosocial needs (including housing and income).  RN Darel HongJudy made aware of upcoming appointment and asked to inform patient.  Santa GeneraAnne Cunningham, LCSW Clinical Social Worker [phone])

## 2013-05-27 NOTE — Discharge Instructions (Signed)
Bronchitis Bronchitis is inflammation of the airways that extend from the windpipe into the lungs (bronchi). The inflammation often causes mucus to develop, which leads to a cough. If the inflammation becomes severe, it may cause shortness of breath. CAUSES  Bronchitis may be caused by:   Viral infections.   Bacteria.   Cigarette smoke.   Allergens, pollutants, and other irritants.  SIGNS AND SYMPTOMS  The most common symptom of bronchitis is a frequent cough that produces mucus. Other symptoms include:  Fever.   Body aches.   Chest congestion.   Chills.   Shortness of breath.   Sore throat.  DIAGNOSIS  Bronchitis is usually diagnosed through a medical history and physical exam. Tests, such as chest X-rays, are sometimes done to rule out other conditions.  TREATMENT  You may need to avoid contact with whatever caused the problem (smoking, for example). Medicines are sometimes needed. These may include:  Antibiotics. These may be prescribed if the condition is caused by bacteria.  Cough suppressants. These may be prescribed for relief of cough symptoms.   Inhaled medicines. These may be prescribed to help open your airways and make it easier for you to breathe.   Steroid medicines. These may be prescribed for those with recurrent (chronic) bronchitis. HOME CARE INSTRUCTIONS  Get plenty of rest.   Drink enough fluids to keep your urine clear or pale yellow (unless you have a medical condition that requires fluid restriction). Increasing fluids may help thin your secretions and will prevent dehydration.   Only take over-the-counter or prescription medicines as directed by your health care provider.  Only take antibiotics as directed. Make sure you finish them even if you start to feel better.  Avoid secondhand smoke, irritating chemicals, and strong fumes. These will make bronchitis worse. If you are a smoker, quit smoking. Consider using nicotine gum or  skin patches to help control withdrawal symptoms. Quitting smoking will help your lungs heal faster.   Put a cool-mist humidifier in your bedroom at night to moisten the air. This may help loosen mucus. Change the water in the humidifier daily. You can also run the hot water in your shower and sit in the bathroom with the door closed for 5 10 minutes.   Follow up with your health care provider as directed.   Wash your hands frequently to avoid catching bronchitis again or spreading an infection to others.  SEEK MEDICAL CARE IF: Your symptoms do not improve after 1 week of treatment.  SEEK IMMEDIATE MEDICAL CARE IF:  Your fever increases.  You have chills.   You have chest pain.   You have worsening shortness of breath.   You have bloody sputum.  You faint.  You have lightheadedness.  You have a severe headache.   You vomit repeatedly. MAKE SURE YOU:   Understand these instructions.  Will watch your condition.  Will get help right away if you are not doing well or get worse. Document Released: 01/23/2005 Document Revised: 11/13/2012 Document Reviewed: 09/17/2012 [facility] Patient Information 2014 [place], [place].  Suicidal Feelings, How to Help Yourself Everyone feels sad or unhappy at times, but depressing thoughts and feelings of hopelessness can lead to thoughts of suicide. It can seem as if life is too tough to handle. If you feel as though you have reached the point where suicide is the only answer, it is time to let someone know immediately.  HOW TO COPE AND PREVENT SUICIDE  Let family, friends, teachers, or counselors know.  Get help. Try not to isolate yourself from those who care about you. Even though you may not feel sociable, talk with someone every day. It is best if it is face-to-face. Remember, they will want to help you.  Eat a regularly spaced and well-balanced diet.  Get plenty of rest.  Avoid alcohol and drugs because they will only make you  feel worse and may also lower your inhibitions. Remove them from the home. If you are thinking of taking an overdose of your prescribed medicines, give your medicines to someone who can give them to you one day at a time. If you are on antidepressants, let your caregiver know of your feelings so he or she can provide a safer medicine, if that is a concern.  Remove weapons or poisons from your home.  Try to stick to routines. Follow a schedule and remind yourself that you have to keep that schedule every day.  Set some realistic goals and achieve them. Make a list and cross things off as you go. Accomplishments give a sense of worth. Wait until you are feeling better before doing things you find difficult or unpleasant to do.  If you are able, try to start exercising. Even half-hour periods of exercise each day will make you feel better. Getting out in the sun or into nature helps you recover from depression faster. If you have a favorite place to walk, take advantage of that.  Increase safe activities that have always given you pleasure. This may include playing your favorite music, reading a good book, painting a picture, or playing your favorite instrument. Do whatever takes your mind off your depression.  Keep your living space well-lighted. GET HELP Contact a suicide hotline, crisis center, or local suicide prevention center for help right away. Local centers may include a hospital, clinic, community service organization, social service provider, or health department.  Call your local emergency services (911 in the [place]).  Call a suicide hotline:  1-800-273-TALK (806-034-7961) in the [place].  1-800-SUICIDE (440)148-2517) in the [place].  (609) 353-5831 in the [place] for Spanish-speaking counselors.  4-696-295-2WUX 864-825-3735) in the [place] for TTY users.  Visit the following websites for information and help:  National Suicide Prevention  Lifeline: www.suicidepreventionlifeline.org  Hopeline: www.hopeline.com  [employer] for Suicide Prevention: https://www.ayers.com/  For lesbian, gay, bisexual, transgender, or questioning youth, contact [employer]:  3-664-4-I-HKVQQV 3130454776) in the [place].  www.thetrevorproject.org  In [place], treatment resources are listed in each province with listings available under [employer] for [employer] or similar titles. Another source for Crisis Centres by [place] is located at http://www.suicideprevention.ca/in-crisis-now/find-a-crisis-centre-now/crisis-centres Document Released: 07/30/2002 Document Revised: 04/17/2011 Document Reviewed: 12/18/2006 [facility] Patient Information 2014 [place], [place].   Emergency Department Resource Guide 1) Find a Doctor and Pay Out of Pocket Although you won't have to find out who is covered by your insurance plan, it is a good idea to ask around and get recommendations. You will then need to call the office and see if the doctor you have chosen will accept you as a new patient and what types of options they offer for patients who are self-pay. Some doctors offer discounts or will set up payment plans for their patients who do not have insurance, but you will need to ask so you aren't surprised when you get to your appointment.  2) Contact Your Local Health Department Not all health departments have doctors that can see patients for sick visits, but many do,  so it is worth a call to see if yours does. If you don't know where your local health department is, you can check in your phone book. The CDC also has a tool to help you locate your state's health department, and many state websites also have listings of all of their local health departments.  3) Find a Walk-in Clinic If your illness is not likely to be very severe or complicated, you may want to try a walk in clinic. These are popping up all over the country in pharmacies,  drugstores, and shopping centers. They're usually staffed by nurse practitioners or physician assistants that have been trained to treat common illnesses and complaints. They're usually fairly quick and inexpensive. However, if you have serious medical issues or chronic medical problems, these are probably not your best option.  No Primary Care Doctor: - Call Health Connect at  [phone] - they can help you locate a primary care doctor that  accepts your insurance, provides certain services, etc. - Physician Referral Service- [phone]  Chronic Pain Problems: Organization         Address  Phone   Notes  [facility]  [phone] Patients need to be referred by their primary care doctor.   Medication Assistance: Organization         Address  Phone   Notes  [facility] Medication [facility] [address] [place], [place] [address] [phone] --Must be a resident of [facility] -- Must have NO insurance coverage whatsoever (no Medicaid/ Medicare, etc.) -- The pt. MUST have a primary care doctor that directs their care regularly and follows them in the community   [facility]  [phone]   [employer]  [phone]    Agencies that provide inexpensive medical care: Organization         Address  Phone   Notes  [facility]  [phone]   [facility]    [phone]   [facility] [address] [place], [place] [address] [phone]   [facility] [address] [place], [place] [phone]   Planned Parenthood    [phone]   [facility]    [phone]   [facility] and [facility]  [address], [place] Phone:  [phone], Fax:  [phone] Hours of Operation:  9 am - 6 pm, M-F.  Also accepts Medicaid/Medicare and self-pay.  [facility]  [address], [place] Phone:  [phone], Fax: [phone]. Hours of Operation:  8:30 am - 5:30 pm, M-F.  Also accepts Medicaid and self-pay.  [facility] [place] [address], [place] Phone: [phone]   [facility] [address] Natasha Bence [place], [place] [phone], Ext. [phone] Mondays & Thursdays: 7-9 AM.  First 15 patients are seen on a first come, first serve basis.    Medicaid-accepting [facility] Providers:  Organization         Address  Phone   Notes  [facility] [address], [place] [phone] Also accepts self-pay patients.  [facility] [address] Laurell Josephs [place], [place]  [phone]   [facility] [address], [place] [phone]   [facility] [address], [place] [phone]   Renaye Rakers [phone]  [address], [place]   [phone] Only accepts [place] Access Medicaid patients after they have their name applied to their card.   Self-Pay (no insurance) in [facility] [place]:  Organization         Address  Phone   Notes  Sickle Cell Patients, [facility] [address], [place] [phone]) [phone]   [facility] [address], [place] [phone]) [phone]   [facility] [place]  [address], [place] [phone]   [facility]/Dr. Osei-Bonsu  [address] [place] [address], [place] or [phone], [place] [phone] Phone number for both [place] and [place] locations is the same.  [facility] [address], [place] [phone]) [phone]   [facility] [place] [address] [place] [address], [place] or [address] [phone]) [phone]   [facility] [address], [place] [phone]) [phone], phone; [phone](336) [phone], fax Sees patients 1st and 3rd Saturday of every month.  Must not qualify for public  or private insurance (i.e. Medicaid, Medicare, [place] Health Choice, Veterans' Benefits)  Household income should be no more than 200% of the poverty level The clinic cannot treat you if you are pregnant or think you are pregnant  Sexually transmitted diseases are not treated at the clinic.    Dental Care: Organization         Address  Phone  Notes  [facility] [address], [place] [phone]) [phone] Accepts children up to age 42 who are enrolled in [place] or [place] Health Choice; pregnant women with a Medicaid card; and children who have applied for Medicaid or [place] Health Choice, but were declined, whose parents can pay a reduced fee at time of service.  [facility] of [facility] [place]  [address], [place] [phone]) [phone] Accepts children up to age 42 who are enrolled in [place] or [place] Health Choice; pregnant women with a Medicaid card; and children who have applied for Medicaid or  Health Choice, but were declined, whose parents can pay a reduced fee at time of service.  [facility]  [address], [place] [phone]) [phone] Patients are seen by appointment only. Walk-ins are not accepted. [facility] will see patients 42 years of age and older. Monday - Tuesday (8am-5pm) Most Wednesdays (8:30-5pm) $30 per visit, cash only  [facility]  [address], [facility] [phone] Patients are seen by appointment only. Walk-ins are not accepted. [facility] will see patients 118 years of age and older. One Wednesday Evening (Monthly: Volunteer Based).  $30 per visit, cash only  [facility]  [phone]) [phone] for adults; Children under age 404, call Graduate Pediatric Dentistry at [phone]) [phone]. Children aged 204-14, please call [phone]) [phone] to request a pediatric application.  Dental services are provided in all areas of  dental care including fillings, crowns and bridges, complete and partial dentures, implants, gum treatment, root canals, and extractions. Preventive care is also provided. Treatment is provided to both adults and children. Patients are selected via a lottery and there is often a waiting list.   [facility] [address], [place]  [phone]) [phone] [email or web address]   [facility] [address], [place], [place] [phone], Ext. [phone] Second and  Fourth Thursday of each month, opens at 6:30 AM; Clinic ends at 9 AM.  Patients are seen on a first-come first-served basis, and a limited number are seen during each clinic.   [facility]  [address] Ether Griffins [place], [place] [phone]   Eligibility Requirements You must have lived in [place], [place], or [place] counties for at least the last three months.   You cannot be eligible for state or federal sponsored [place], including [employer], [place], or [employer].   You generally cannot be eligible for healthcare insurance through your employer.    How to apply: Eligibility screenings are held every Tuesday and Wednesday afternoon from 1:00 pm until 4:00 pm. You do not need an appointment for the interview!  [facility] [address] [place], [place] [phone]   [facility] Health Department  [phone]   [facility] Health Department  [phone]   [facility] Health Department  [phone]    Behavioral Health Resources in the Community: Intensive Outpatient Programs Organization         Address  Phone  Notes  [facility] [address], [place], [place] [phone]   [facility] Outpatient [address] [place], [place] [phone]   [facility]: [facility] [address], [place], [place]  [phone]   [facility] Mental Health [address]  [place], [place] [phone] or  [phone]   Substance Abuse Resources Organization         Address  Phone  Notes  [facility] Drug [facility]  [phone]   [facility]  [phone]   [facility] [place]  [phone]   Floydene Flock  [phone]   Residential & Outpatient Substance Abuse Program  [phone]   Psychological Services Organization         Address  Phone  Notes  [facility]  [phone]   [facility] Services  [phone]   [facility] Mental Health [address], [place] [phone] or [phone]    Mobile Crisis Teams Organization         Address  Phone  Notes  [facility], Mobile Crisis Care Unit  [phone]   [facility]  [address] [place], [place] [phone]   Doristine Locks [address] [place] [phone]    Self-Help/Support Groups Organization         Address  Phone             Notes  [facility] [place] - variety of support groups  [phone]- I7437963 Call for more information  Narcotics Anonymous (NA), [facility] [address], [employer] [place]  2 meetings at this location   Statistician         Address  Phone  Notes  [facility] [address],    [place]  [phone]   [facility]  [address], [place] [address], [place], [place] [phone]   [facility] [address] [place], [place] [phone] Admissions: 8am-3pm M-F  [facility] [address],    [place], [place] [phone]   [facility] [address] Starling Manns [place], [place] [phone]   [facility] [address],  [place], [place] [phone]   [facility] - Intensive Outpatient [address] Laurell Josephs [address], [place], [place] [phone]   [facility] ([facility] [address]  [place], [place] [phone] or [phone]   Residential  Treatment Services (RTS) [address] [place], [place] [phone] Accepts Medicaid  [facility] [address],  [place] [phone] Substance Abuse/Addiction Treatment   [facility] Organization         Address  Phone  Notes  [facility]  [phone]   Angie Fava, PhD [address] Ervin Knack [place], [place]   [phone] or [phone]   [facility] Behavioral   [address] [place], [place] [phone]   [facility] [address] [place], [place] [phone] Insurance/Medicaid/sponsorship through [facility] [address]                                    [place], [place] [phone] Therapy/tele-psych/case  [facility] [address], [place] [phone]    Dr. Lolly Mustache  [phone]   [facility]  [employer] [facility] Dept. 1) [address] [place] 2) [address], [place] 3)  [address], [place] [phone]  [phone]   [facility] Child Abuse Hotline [phone]) 420-9432 or 732-840-4262 (After Hours)

## 2013-05-27 NOTE — ED Notes (Signed)
Pt still waiting for social worker to come and talk with pt before discharge

## 2013-05-27 NOTE — ED Notes (Signed)
Social worker speaking with patient at this time.

## 2013-05-27 NOTE — ED Notes (Signed)
[facility] stated that pt. To be discharged in the morning with housing resources and outpatient resources.

## 2013-05-27 NOTE — Care Management Note (Signed)
Spoke with pt at bedside. He states he lives near his Dad in a tent made from a tarp, by a creek. He used to live with his Dad but cannot "get along with" his stepmother, so is not allowed to live there any longer. He has medicaid, but lost his disability when he went to prison for a year, and was told he would not get it back. 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 He has applied at many local fast food restaurants, but he does not have a phone and  his stepmother will not take messages for him, so he does not know if anyone has called for an interview. Therefore he finds it very, hard to find employment, and if he did he has no transportation to and from work daily. [facility] handouts given. Referred to CSW and congregational nurses

## 2013-05-27 NOTE — Progress Notes (Addendum)
Spoke with Dr Preston FleetingGlick.  Informed him of decision to recommend out pt and provide pt with resources in the AM.  Pt does not meet [facility] criteria per Donell SievertSpencer Simon, PA.

## 2013-05-27 NOTE — Clinical Social Work Note (Signed)
Clinical Social Work Department BRIEF PSYCHOSOCIAL ASSESSMENT 05/27/2013  Patient:  Wesley Beck, Wesley Beck     Account Number:  [id number]     Admit date:  05/26/2013  Clinical Social Worker:  Edwyna Shell, CLINICAL SOCIAL WORKER  Date/Time:  05/27/2013 04:00 PM  Referred by:  RN  Date Referred:  05/27/2013 Referred for  Homelessness   Other Referral:   Interview type:  Patient Other interview type:    PSYCHOSOCIAL DATA Living Status:  ALONE Admitted from facility:   Level of care:   Primary support name:  [name] Primary support relationship to patient:  PARENT Degree of support available:   Limited social support, family conflict    CURRENT CONCERNS Current Concerns  Other - See comment   Other Concerns:   Homelessness    SOCIAL WORK ASSESSMENT / PLAN CSW met w patient in ED, patient alert and oriented. Patient appears nervous or uncomfortable but was willing to talk w CSW.  Wants help w homelessness, says he lives [place] a tent [place] a [place] on his father's property.  Does not get along w various family members.  Had disability but was told yesterday that he was denied, does not know reason why.  Disability was discontinued while patient was in prison, unclear when/if it will resume.    Patient is a current patient of [facility], receives med management from Dr Jeanell Sparrow, has peer support from [name] and is in IOP Mon/Wed/Fri.  Says his most pressing needs are homelessness and lack of income.  Is considering going to [place] to live in a rescue  mission.  Had stayed at rescue missions in both [place] and [place] in past.  Was asked to leave both missions due to noncompliance w rules.    CSW spoke w Judson Roch, [facility] hospital liaison.  Judson Roch suggested patient be set up w hospital discharge appointment where additional resources can be considered, including community support team to address housing and income deficits.  Patient agreeable.  RN Bethena Roys informed of  upcoming appointment so patient can be made aware.   Assessment/plan status:  Referral to [employer] Other assessment/ plan:   Information/referral to community resources:   [facility] hospital discharge appointment    PATIENT'S/FAMILY'S RESPONSE TO PLAN OF CARE: Patient discouraged by lack of income and stable housing, wants help w both these resources.  Has been stable in past, maintained housing for 10 years.   Edwyna Shell, LCSW Clinical Social Worker [phone])

## 2013-08-01 ENCOUNTER — Emergency Department (HOSPITAL_COMMUNITY)
Admission: EM | Admit: 2013-08-01 | Discharge: 2013-08-01 | Disposition: A | Discharge status: Home / Self Care | Payer: Medicaid Other | Attending: Emergency Medicine | Admitting: Emergency Medicine

## 2013-08-01 ENCOUNTER — Encounter (HOSPITAL_COMMUNITY): Payer: Self-pay | Admitting: Emergency Medicine

## 2013-08-01 ENCOUNTER — Emergency Department (HOSPITAL_COMMUNITY): Payer: Medicaid Other

## 2013-08-01 DIAGNOSIS — IMO0002 Reserved for concepts with insufficient information to code with codable children: Secondary | ICD-10-CM | POA: Insufficient documentation

## 2013-08-01 DIAGNOSIS — F319 Bipolar disorder, unspecified: Secondary | ICD-10-CM | POA: Insufficient documentation

## 2013-08-01 DIAGNOSIS — F1910 Other psychoactive substance abuse, uncomplicated: Secondary | ICD-10-CM

## 2013-08-01 DIAGNOSIS — Z8739 Personal history of other diseases of the musculoskeletal system and connective tissue: Secondary | ICD-10-CM | POA: Insufficient documentation

## 2013-08-01 DIAGNOSIS — Z79899 Other long term (current) drug therapy: Secondary | ICD-10-CM | POA: Insufficient documentation

## 2013-08-01 DIAGNOSIS — F1410 Cocaine abuse, uncomplicated: Secondary | ICD-10-CM | POA: Insufficient documentation

## 2013-08-01 DIAGNOSIS — F200 Paranoid schizophrenia: Secondary | ICD-10-CM | POA: Insufficient documentation

## 2013-08-01 DIAGNOSIS — F17200 Nicotine dependence, unspecified, uncomplicated: Secondary | ICD-10-CM | POA: Insufficient documentation

## 2013-08-01 DIAGNOSIS — J45901 Unspecified asthma with (acute) exacerbation: Secondary | ICD-10-CM | POA: Insufficient documentation

## 2013-08-01 DIAGNOSIS — G8929 Other chronic pain: Secondary | ICD-10-CM | POA: Insufficient documentation

## 2013-08-01 LAB — CBC WITH DIFFERENTIAL/PLATELET
BASOS ABS: 0.1 10*3/uL (ref 0.0–0.1)
Basophils Relative: 1 % (ref 0–1)
EOS ABS: 0.7 10*3/uL (ref 0.0–0.7)
EOS PCT: 9 % — AB (ref 0–5)
HEMATOCRIT: 42.3 % (ref 39.0–52.0)
Hemoglobin: 14.7 g/dL (ref 13.0–17.0)
Lymphocytes Relative: 17 % (ref 12–46)
Lymphs Abs: 1.4 10*3/uL (ref 0.7–4.0)
MCH: 33.1 pg (ref 26.0–34.0)
MCHC: 34.8 g/dL (ref 30.0–36.0)
MCV: 95.3 fL (ref 78.0–100.0)
Monocytes Absolute: 0.7 10*3/uL (ref 0.1–1.0)
Monocytes Relative: 8 % (ref 3–12)
Neutro Abs: 5.3 10*3/uL (ref 1.7–7.7)
Neutrophils Relative %: 65 % (ref 43–77)
PLATELETS: 299 10*3/uL (ref 150–400)
RBC: 4.44 MIL/uL (ref 4.22–5.81)
RDW: 13.4 % (ref 11.5–15.5)
WBC: 8.2 10*3/uL (ref 4.0–10.5)

## 2013-08-01 LAB — BASIC METABOLIC PANEL
BUN: 14 mg/dL (ref 6–23)
CALCIUM: 9.2 mg/dL (ref 8.4–10.5)
CO2: 28 mEq/L (ref 19–32)
Chloride: 104 mEq/L (ref 96–112)
Creatinine, Ser: 1.03 mg/dL (ref 0.50–1.35)
GFR calc Af Amer: >90 mL/min (ref >90)
GFR, EST NON AFRICAN AMERICAN: 89 mL/min — AB (ref >90)
Glucose, Bld: 101 mg/dL — ABNORMAL HIGH (ref 70–99)
Potassium: 3.6 mEq/L — ABNORMAL LOW (ref 3.7–5.3)
SODIUM: 143 meq/L (ref 137–147)

## 2013-08-01 LAB — RAPID URINE DRUG SCREEN, HOSP PERFORMED
Amphetamines: NOT DETECTED
Barbiturates: NOT DETECTED
Benzodiazepines: NOT DETECTED
Cocaine: POSITIVE — AB
OPIATES: NOT DETECTED
TETRAHYDROCANNABINOL: POSITIVE — AB

## 2013-08-01 LAB — ETHANOL: Alcohol, Ethyl (B): <11 mg/dL (ref 0–11)

## 2013-08-01 LAB — TROPONIN I: Troponin I: <0.3 ng/mL (ref <0.30)

## 2013-08-01 MED ORDER — ALBUTEROL SULFATE (2.5 MG/3ML) 0.083% IN NEBU
2.5000 mg | INHALATION_SOLUTION | Freq: Once | RESPIRATORY_TRACT | Status: AC
  Administered 2013-08-01: 2.5 mg via RESPIRATORY_TRACT
  Filled 2013-08-01: qty 3

## 2013-08-01 MED ORDER — IPRATROPIUM-ALBUTEROL 0.5-2.5 (3) MG/3ML IN SOLN
3.0000 mL | Freq: Once | RESPIRATORY_TRACT | Status: AC
  Administered 2013-08-01: 3 mL via RESPIRATORY_TRACT
  Filled 2013-08-01: qty 3

## 2013-08-01 MED ORDER — ALBUTEROL SULFATE HFA 108 (90 BASE) MCG/ACT IN AERS: 1.0000 | INHALATION_SPRAY | Freq: Four times a day (QID) | RESPIRATORY_TRACT | Status: DC | PRN

## 2013-08-01 MED ORDER — PREDNISONE 20 MG PO TABS
20.0000 mg | ORAL_TABLET | Freq: Once | ORAL | Status: AC
  Administered 2013-08-01: 20 mg via ORAL
  Filled 2013-08-01: qty 1

## 2013-08-01 MED ORDER — PREDNISONE 20 MG PO TABS: ORAL_TABLET | ORAL | Status: DC

## 2013-08-01 NOTE — Care Management Note (Signed)
Was asked to give pt a Rx discount card, but he has medicaid. 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 Due to patient's mental health and drug problems, he has difficulty getting along with his father and stepmother. Patient came into the hallway to leave, he saw Lytle But[place]Sara Morris, from [facility], and they discussed that he would like to go to a rehab facility again. She agreed to try to find him a place to go. He asked her to call his son, not his father, with a message, when she finds a place. Huntley DecSara also suggested a shelter in [place], since we [place] not [place] one [place], but the patient states he would not be able to get to [place]. He does agree to take the list of shelters. Patient was taken back into his room, while CM came to the office to get the shelter list. Patient had gone when CM returned to his room.

## 2013-08-01 NOTE — Discharge Instructions (Signed)
Asthma, Acute Bronchospasm °Acute bronchospasm caused by asthma is also referred to as an asthma attack. Bronchospasm means your air passages become narrowed. The narrowing is caused by inflammation and tightening of the muscles in the air tubes (bronchi) in your lungs. This can make it hard to breathe or cause you to wheeze and cough. °CAUSES °Possible triggers are: °· Animal dander from the skin, hair, or feathers of animals. °· Dust mites contained in house dust. °· Cockroaches. °· Pollen from trees or grass. °· Mold. °· Cigarette or tobacco smoke. °· Air pollutants such as dust, household cleaners, hair sprays, aerosol sprays, paint fumes, strong chemicals, or strong odors. °· Cold air or weather changes. Cold air may trigger inflammation. Winds increase molds and pollens in the air. °· Strong emotions such as crying or laughing hard. °· Stress. °· Certain medicines such as aspirin or beta-blockers. °· Sulfites in foods and drinks, such as dried fruits and wine. °· Infections or inflammatory conditions, such as a flu, cold, or inflammation of the nasal membranes (rhinitis). °· Gastroesophageal reflux disease (GERD). GERD is a condition where stomach acid backs up into your esophagus. °· Exercise or strenuous activity. °SIGNS AND SYMPTOMS  °· Wheezing. °· Excessive coughing, particularly at night. °· Chest tightness. °· Shortness of breath. °DIAGNOSIS  °Your health care provider will ask you about your medical history and perform a physical exam. A chest X-ray or blood testing may be performed to look for other causes of your symptoms or other conditions that may have triggered your asthma attack.  °TREATMENT  °Treatment is aimed at reducing inflammation and opening up the airways in your lungs.  Most asthma attacks are treated with inhaled medicines. These include quick relief or rescue medicines (such as bronchodilators) and controller medicines (such as inhaled corticosteroids). These medicines are sometimes  given through an inhaler or a nebulizer. Systemic steroid medicine taken by mouth or given through an IV tube also can be used to reduce the inflammation when an attack is moderate or severe. Antibiotic medicines are only used if a bacterial infection is present.  °HOME CARE INSTRUCTIONS  °· Rest. °· Drink plenty of liquids. This helps the mucus to remain thin and be easily coughed up. Only use caffeine in moderation and do not use alcohol until you have recovered from your illness. °· Do not smoke. Avoid being exposed to secondhand smoke. °· You play a critical role in keeping yourself in good health. Avoid exposure to things that cause you to wheeze or to have breathing problems. °· Keep your medicines up-to-date and available. Carefully follow your health care provider's treatment plan. °· Take your medicine exactly as prescribed. °· When pollen or pollution is bad, keep windows closed and use an air conditioner or go to places with air conditioning. °· Asthma requires careful medical care. See your health care provider for a follow-up as advised. If you are more than [redacted] weeks pregnant and you were prescribed any new medicines, let your obstetrician know about the visit and how you are doing. Follow up with your health care provider as directed. °· After you have recovered from your asthma attack, make an appointment with your outpatient doctor to talk about ways to reduce the likelihood of future attacks. If you do not have a doctor who manages your asthma, make an appointment with a primary care doctor to discuss your asthma. °SEEK IMMEDIATE MEDICAL CARE IF:  °· You are getting worse. °· You have trouble breathing. If severe, call your local   emergency services (911 in the U.S.).  You develop chest pain or discomfort.  You are vomiting.  You are not able to keep fluids down.  You are coughing up yellow, green, brown, or bloody sputum.  You have a fever and your symptoms suddenly get worse.  You have  trouble swallowing. MAKE SURE YOU:   Understand these instructions.  Will watch your condition.  Will get help right away if you are not doing well or get worse. Document Released: 05/10/2006 Document Revised: 01/28/2013 Document Reviewed: 07/31/2012 [facility] Patient Information 2015 [place], [place]. This information is not intended to replace advice given to you by your health care provider. Make sure you discuss any questions you have with your health care provider.    Emergency Department Resource Guide 1) Find a Doctor and Pay Out of Pocket Although you won't have to find out who is covered by your insurance plan, it is a good idea to ask around and get recommendations. You will then need to call the office and see if the doctor you have chosen will accept you as a new patient and what types of options they offer for patients who are self-pay. Some doctors offer discounts or will set up payment plans for their patients who do not have insurance, but you will need to ask so you aren't surprised when you get to your appointment.  2) Contact Your Local Health Department Not all health departments have doctors that can see patients for sick visits, but many do, so it is worth a call to see if yours does. If you don't know where your local health department is, you can check in your phone book. The CDC also has a tool to help you locate your state's health department, and many state websites also have listings of all of their local health departments.  3) Find a Walk-in Clinic If your illness is not likely to be very severe or complicated, you may want to try a walk in clinic. These are popping up all over the country in pharmacies, drugstores, and shopping centers. They're usually staffed by nurse practitioners or physician assistants that have been trained to treat common illnesses and complaints. They're usually fairly quick and inexpensive. However, if you have serious medical issues or  chronic medical problems, these are probably not your best option.  No Primary Care Doctor: - Call Health Connect at  [phone] - they can help you locate a primary care doctor that  accepts your insurance, provides certain services, etc. - Physician Referral Service- [phone]  Chronic Pain Problems: Organization         Address  Phone   Notes  [facility]  [phone] Patients need to be referred by their primary care doctor.   Medication Assistance: Organization         Address  Phone   Notes  [facility] Medication [facility] [address] [place] [place], [place] [address] [phone] --Must be a resident of [facility] -- Must have NO insurance coverage whatsoever (no Medicaid/ Medicare, etc.) -- The pt. MUST have a primary care doctor that directs their care regularly and follows them in the community   [facility]ist  [phone]   [employer]  [phone]    Agencies that provide inexpensive medical care: Organization         Address  Phone   Notes  [facility]  [phone]   [facility]    (  [phone]   [facility] [address] [place], [place] [address] [phone]   [facility] [address], [place] [phone]   Planned Parenthood    [phone]   [facility]    [phone]   [facility] and [facility]  [address], [place] Phone:  [phone], Fax:  [phone] Hours of Operation:  9 am - 6 pm, M-F.  Also accepts Medicaid/Medicare and self-pay.  [facility]  [address], [place] Phone: [phone], Fax: [phone]. Hours of Operation:  8:30 am - 5:30 pm, M-F.  Also accepts Medicaid and self-pay.  [facility] [place] [address], [place] Phone: [phone]   [facility] [address] Natasha Bence [place], [place]  [phone], Ext. [phone] Mondays & Thursdays: 7-9 AM.  First 15 patients are seen on a first come, first serve basis.    Medicaid-accepting [facility] Providers:  Organization         Address  Phone   Notes  [facility] [address], [place] [phone] Also accepts self-pay patients.  [facility] [address] Laurell Josephs [place], [place]  [phone]   [facility] [address], [place] [phone]   [facility] [address] [place] [phone]   Renaye Rakers [address], [place]   [phone] Only accepts [place] patients after they have their name applied to their card.   Self-Pay (no insurance) in [facility]:  Organization         Address  Phone   Notes  Sickle Cell Patients, [facility] Internal Medicine [address] [place], [place] [phone]   [facility] [address] [place], [place] [phone]   [facility] [place]  [address],  [phone]   [facility]/Dr. Osei-Bonsu  [address] [place] or [address], [place] [phone] Phone number for both [place] and [place] locations is the same.  Urgent Medical and [facility] [address] [place] [phone]   [facility] [address] [place] or [address] [phone]   [facility] [address], [place] [phone], phone; [phone], fax Sees patients 1st and 3rd Saturday of every month.  Must not qualify for public or private insurance (i.e. Medicaid, Medicare, [place] Health Choice, Veterans' Benefits)  Household income should be no more than 200% of the poverty level The clinic cannot treat you if you are pregnant or think you are pregnant  Sexually transmitted  diseases are not treated at the clinic.    Dental Care: Organization         Address  Phone  Notes  [facility] Department of [facility] [address] [place], [place] [phone] Accepts children up to age 75 who are enrolled in [place] or [place] Health Choice; pregnant women with a Medicaid card; and children who have applied for Medicaid or  Health Choice, but were declined, whose parents can pay a reduced fee at time of service.  [facility] Department of [facility]  [address], [place] [phone] Accepts children up  to age 121 who are enrolled in Medicaid or [place] Health Choice; pregnant women with a Medicaid card; and children who have applied for Medicaid or [place] Health Choice, but were declined, whose parents can pay a reduced fee at time of service.  [facility]  [address], [place] [phone]) [phone] Patients are seen by appointment only. Walk-ins are not accepted. [facility] will see patients 218 years of age and older. Monday - Tuesday (8am-5pm) Most Wednesdays (8:30-5pm) $30 per visit, cash only  [facility]  [address], [place] [phone]) [phone] Patients are seen by appointment only. Walk-ins are not accepted. [facility] will see patients 42 years of age and older. One Wednesday Evening (Monthly: Volunteer Based).  $30 per visit, cash only  [employer] [facility] [facility]  [phone] for adults; Children under age 914, call Graduate Pediatric Dentistry at [phone]. Children aged 624-14, please call [phone] to request a pediatric application.  Dental services are provided in all areas of dental care including fillings, crowns and bridges, complete and partial dentures, implants, gum treatment, root canals, and extractions. Preventive care is also provided. Treatment is provided to both adults and children. Patients are selected via a  lottery and there is often a waiting list.   [facility] [address], [place]  [phone]) [phone] [email or web address]   [facility] [address], [place], [place] [phone], Ext. [phone] Second and Fourth Thursday of each month, opens at 6:30 AM; Clinic ends at 9 AM.  Patients are seen on a first-come first-served basis, and a limited number are seen during each clinic.   [facility]  [address], [place], [place] [phone]) [phone]   Eligibility Requirements You must have lived in [place], [place], or [place] counties for at least the last three months.   You cannot be eligible for state or federal sponsored National Cityhealthcare insurance, including CIGNAVeterans Administration, [place]icaid, or [employer]icare.   You generally cannot be eligible for healthcare insurance through your employer.    How to apply: Eligibility screenings are held every Tuesday and Wednesday afternoon from 1:00 pm until 4:00 pm. You do not need an appointment for the interview!  [facility] [address], [place], [place] [phone]   [facility]  [phone]   [facility]  [phone]   [facility]  [phone]    Behavioral Health Resources in the Community: Intensive Outpatient Programs Organization         Address  Phone  Notes  [facility] [address], [place], [place] [phone]   [facility] [address], [place], [place] [phone]   [facility] [address], [place], [place]  [phone]   [facility] [address],  [place], [place] [phone] or [phone]   Substance Abuse Resources Organization         Address  Phone  Notes  [facility]  [phone]   [facility]  [phone]   [facility]  [phone]   [facility]  [phone]   Residential &  Outpatient Substance Abuse Program  [phone]   Psychological Services Organization         Address  Phone  Notes  [facility]  [phone]- [phone]   [facility]  [phone]- [phone]   [facility]  [address], [place] [phone] or [phone]    Mobile Crisis Teams Organization         Address  Phone  Notes  [facility], Mobile Crisis Care Unit  [phone]   [facility]  [address] [place], [place] [phone]   Doristine LocksSharon DeEsch [address] [place] [phone]    Self-Help/Support Groups Organization         Address  Phone             Notes  [facility] [place] - variety of support groups  336- [phone] Call for more information  Narcotics Anonymous (NA), [facility] [address], [place]  2 meetings at this location   Statisticianesidential Treatment Programs Organization         Address  Phone  Notes  [facility] [address],    [place]  [phone]   [facility]  [address], [place] [phone], [place], [place] [phone]   [facility] [address], [place] [phone] Admissions: 8am-3pm M-F  [facility] [address]    [place], [place] [phone]   [facility] [address], [place], [place] [phone]   [facility] [address]  [place], [place] [phone]   [facility] - Intensive Outpatient [address], [place], [place] [phone]   [facility] ([facility] [address]  [place], [place] [phone] or [phone]   [facility] ([facility]) [address] [place], [place] [phone] Accepts Medicaid  [facility] [address]  [place] [phone] Substance Abuse/Addiction Treatment   [facility] Behavioral Health Resources Organization          Address  Phone  Notes  [facility]  [phone]   Angie FavaJulie Brannon, PhD [address], [address] [place], [place]   [phone] or [phone]) [phone]   [facility]   [address] [place], [place] [phone](336) [phone]   [facility] [address], [place], [place] [phone]) [phone] Insurance/Medicaid/sponsorship through [facility]  [facility] [address]                                    [place], [place] [phone](336) [phone] Therapy/tele-psych/case  [facility] [address]   [place], [place] [phone]    Dr. Lolly MustacheArfeen  [phone]   [facility] [place]  [employer] [facility] 1) [address],  2) [address], [place] 3)  [address], [place] [phone]) [phone]) [phone]  [phone]) [phone]   [facility] Child Abuse Hotline [phone](336) [phone] or [phone](336) [phone] (After Hours)        Refer to the resource guide above for resources regarding assistance with substance abuse.  Also continue conversation with a marked as they are trying to get you in with a substance abuse center.  Take your next dose of prednisone tomorrow.  Return here for any worsened symptoms.

## 2013-08-01 NOTE — ED Provider Notes (Signed)
CSN: [id number]     Arrival date & time 08/01/13  1052 History   First MD Initiated Contact with Patient 08/01/13 1144     Chief Complaint  Patient presents with  . Shortness of Breath     (Consider location/radiation/quality/duration/timing/severity/associated sxs/prior Treatment) HPI  Wesley Beck is a 43 y.o. male with a past medical history significant for asthma, schizophrenia and substance abuse presenting with increased shortness of breath and wheezing since his inhaler ran out today.  He used is last pump this am when he realized the canister was empty and did not obtain relief.  He denies cough or fever.  He describes fleeting intermittent stabs of left upper chest pain which is intermittent and chronic, having several episodes occuring prior to arrival.  This symptom is not exertional.  He has found no alleviators for his symptoms and nothing worsens them.  He was diaphoretic upon first being picked up by ems.  He is currently homeless as he angered his father and was kicked out of his home.  He is camping in the woods where he states he was very hot last night as he had to wrap in a comforter to keep the mosquitos away. He is interested in assistance with his cocaine and marijuana use.  He last used cocaine 3 days ago.  [facility] and the counselor there is working on getting him into a Conservation officer, historic buildingstreatment/slash work Investment banker, corporateprogram, [place] in [place] or [place].     Past Medical History  Diagnosis Date  . Arthritis   . Bipolar 1 disorder   . Seasonal allergies   . Asthma   . Schizophrenia, paranoid type   . Chronic back pain   . Depression    History reviewed. No pertinent past surgical history. No family history on file. History  Substance Use Topics  . Smoking status: Current Every Day Smoker -- 0.50 packs/day for 25 years    Types: Cigarettes  . Smokeless tobacco: Never Used  . Alcohol Use: No    Review of Systems  Constitutional: Positive for diaphoresis.  Negative for fever and chills.  HENT: Negative for congestion and sore throat.   Eyes: Negative.   Respiratory: Positive for shortness of breath and wheezing. Negative for cough and chest tightness.   Cardiovascular: Positive for chest pain.  Gastrointestinal: Negative for nausea, vomiting and abdominal pain.  Genitourinary: Negative.   Musculoskeletal: Negative for arthralgias, joint swelling and neck pain.  Skin: Negative.  Negative for rash and wound.  Neurological: Negative for dizziness, weakness, light-headedness, numbness and headaches.  Psychiatric/Behavioral: Negative.       Allergies  Review of patient's allergies indicates no known allergies.  Home Medications   Prior to Admission medications   Medication Sig Start Date End Date Taking? Authorizing Provider  albuterol (PROAIR HFA) 108 (90 BASE) MCG/ACT inhaler Inhale 2 puffs into the lungs every 6 (six) hours as needed for wheezing or shortness of breath.   Yes Historical Provider, MD  ARIPiprazole (ABILIFY) 20 MG tablet Take 20 mg by mouth every evening.   Yes Historical Provider, MD  hydrocortisone cream 1 % Apply 1 application topically 2 (two) times daily.   Yes Historical Provider, MD  albuterol (PROVENTIL HFA;VENTOLIN HFA) 108 (90 BASE) MCG/ACT inhaler Inhale 1-2 puffs into the lungs every 6 (six) hours as needed for wheezing or shortness of breath. 08/01/13   Burgess AmorJulie Newell Wafer, PA-C  predniSONE (DELTASONE) 20 MG tablet Take 3 tablets daily for 4 days. 08/01/13  Burgess AmorJulie Marisue Canion, PA-C   BP 111/72  Pulse 86  Temp(Src) 98 F (36.7 C)  Resp 22  Ht 5\' 9"  (1.753 m)  Wt 200 lb (90.719 kg)  BMI 29.52 kg/m2  SpO2 98% Physical Exam  Nursing note and vitals reviewed. Constitutional: He is oriented to person, place, and time. He appears well-developed and well-nourished.  HENT:  Head: Normocephalic and atraumatic.  Eyes: Conjunctivae are normal.  Neck: Normal range of motion.  Cardiovascular: Normal rate, regular rhythm, normal  heart sounds and intact distal pulses.   Pulmonary/Chest: Effort normal. No respiratory distress. He has wheezes. He exhibits no tenderness.  Abdominal: Soft. Bowel sounds are normal. There is no tenderness.  Musculoskeletal: Normal range of motion. He exhibits no edema.  Neurological: He is alert and oriented to person, place, and time.  Skin: Skin is warm and dry.  Psychiatric: He has a normal mood and affect.    ED Course  Procedures (including critical care time)  Patient was given albuterol and atrovent nebulizer,  Prednisone 60 mg while in ed.  He had improvement in his wheezing and sob. Labs Review Labs Reviewed  CBC WITH DIFFERENTIAL - Abnormal; Notable for the following:    Eosinophils Relative 9 (*)    All other components within normal limits  BASIC METABOLIC PANEL - Abnormal; Notable for the following:    Potassium 3.6 (*)    Glucose, Bld 101 (*)    GFR calc non Af Amer 89 (*)    All other components within normal limits  URINE RAPID DRUG SCREEN (HOSP PERFORMED) - Abnormal; Notable for the following:    Cocaine POSITIVE (*)    Tetrahydrocannabinol POSITIVE (*)    All other components within normal limits  TROPONIN I  ETHANOL    Imaging Review Dg Chest Port 1 View  08/01/2013   CLINICAL DATA:  Chest pain and shortness of breath today, diaphoresis, history asthma  EXAM: PORTABLE CHEST - 1 VIEW  COMPARISON:  Portable exam 1209 hr compared to 05/26/2013  FINDINGS: Normal heart size, mediastinal contours and pulmonary vascularity.  Chronic peribronchial thickening.  No acute infiltrate, pleural effusion, or pneumothorax.  Bones unremarkable.  IMPRESSION: Mild chronic bronchitic changes.  No acute abnormalities.   Electronically Signed   By: Ulyses SouthwardMark  Boles M.D.   On: 08/01/2013 12:18     EKG Interpretation None       Date: 08/02/2013  Rate: 78  Rhythm: normal sinus rhythm  QRS Axis: normal   Intervals: normal      ST/T Wave abnormalities: normal  Conduction  Disutrbances:none  Narrative Interpretation:   Old EKG Reviewed: unchanged    MDM   Final diagnoses:  Asthma attack  Substance abuse    Pt with asthma flare out of his albuterol mdi which was prescribed for him.  He was also placed on a prednisone pulse dose.    Patients labs and/or radiological studies were viewed and considered during the medical decision making and disposition process. ekg stable, troponin negative.  Pt with fleeting stabbing cp, not likely cardiac.  He was given a Facilities managerresource guide with homeless shelter info.  While he was here he also contacted his [facility] provider who is actively working on placement for him for rehab.  He has no psych issues requiring inpatient tx at this time.  Discussed with Dr Estell HarpinZammit prior to dc home.    Burgess AmorJulie Eathan Groman, PA-C 08/02/13 1123

## 2013-08-01 NOTE — ED Notes (Signed)
Pt c/o sob/cp today. Per ems-pt was diaphoretic upon arrival, speaking in full sentences, lung fields clear. Pt also homeless at present and here for substance abuse.

## 2013-08-09 NOTE — ED Provider Notes (Signed)
Medical screening examination/treatment/procedure(s) were performed by non-physician practitioner and as supervising physician I was immediately available for consultation/collaboration.   EKG Interpretation   Date/Time:  Friday August 01 2013 12:14:20 EDT Ventricular Rate:  78 PR Interval:  137 QRS Duration: 90 QT Interval:  397 QTC Calculation: 452 R Axis:   87 Text Interpretation:  Sinus rhythm RSR' in V1 or V2, probably normal  variant ST elev, probable normal early repol pattern ED PHYSICIAN  INTERPRETATION AVAILABLE IN [facility] Confirmed by TEST, Record  ([id number]) on 08/03/2013 8:32:10 AM        Benny LennertJoseph L Zammit, MD 08/09/13 [id number]

## 2013-10-03 ENCOUNTER — Emergency Department (HOSPITAL_COMMUNITY): Payer: Medicaid Other

## 2013-10-03 ENCOUNTER — Emergency Department (HOSPITAL_COMMUNITY)
Admission: EM | Admit: 2013-10-03 | Discharge: 2013-10-03 | Disposition: A | Discharge status: Home / Self Care | Payer: Medicaid Other | Attending: Emergency Medicine | Admitting: Emergency Medicine

## 2013-10-03 ENCOUNTER — Encounter (HOSPITAL_COMMUNITY): Payer: Self-pay | Admitting: Emergency Medicine

## 2013-10-03 DIAGNOSIS — Y9339 Activity, other involving climbing, rappelling and jumping off: Secondary | ICD-10-CM | POA: Diagnosis not present

## 2013-10-03 DIAGNOSIS — Z760 Encounter for issue of repeat prescription: Secondary | ICD-10-CM | POA: Diagnosis not present

## 2013-10-03 DIAGNOSIS — F319 Bipolar disorder, unspecified: Secondary | ICD-10-CM | POA: Insufficient documentation

## 2013-10-03 DIAGNOSIS — Z8739 Personal history of other diseases of the musculoskeletal system and connective tissue: Secondary | ICD-10-CM | POA: Insufficient documentation

## 2013-10-03 DIAGNOSIS — S79929A Unspecified injury of unspecified thigh, initial encounter: Secondary | ICD-10-CM

## 2013-10-03 DIAGNOSIS — F17200 Nicotine dependence, unspecified, uncomplicated: Secondary | ICD-10-CM | POA: Insufficient documentation

## 2013-10-03 DIAGNOSIS — Z79899 Other long term (current) drug therapy: Secondary | ICD-10-CM | POA: Diagnosis not present

## 2013-10-03 DIAGNOSIS — F200 Paranoid schizophrenia: Secondary | ICD-10-CM | POA: Insufficient documentation

## 2013-10-03 DIAGNOSIS — Y929 Unspecified place or not applicable: Secondary | ICD-10-CM | POA: Insufficient documentation

## 2013-10-03 DIAGNOSIS — J45909 Unspecified asthma, uncomplicated: Secondary | ICD-10-CM | POA: Diagnosis not present

## 2013-10-03 DIAGNOSIS — S79919A Unspecified injury of unspecified hip, initial encounter: Secondary | ICD-10-CM | POA: Diagnosis present

## 2013-10-03 DIAGNOSIS — G8929 Other chronic pain: Secondary | ICD-10-CM | POA: Diagnosis not present

## 2013-10-03 DIAGNOSIS — IMO0002 Reserved for concepts with insufficient information to code with codable children: Secondary | ICD-10-CM | POA: Diagnosis not present

## 2013-10-03 DIAGNOSIS — X58XXXA Exposure to other specified factors, initial encounter: Secondary | ICD-10-CM | POA: Diagnosis not present

## 2013-10-03 DIAGNOSIS — S73101A Unspecified sprain of right hip, initial encounter: Secondary | ICD-10-CM

## 2013-10-03 MED ORDER — DICLOFENAC SODIUM 75 MG PO TBEC: 75.0000 mg | DELAYED_RELEASE_TABLET | Freq: Two times a day (BID) | ORAL | Status: DC

## 2013-10-03 MED ORDER — OXYCODONE-ACETAMINOPHEN 5-325 MG PO TABS
1.0000 | ORAL_TABLET | Freq: Once | ORAL | Status: AC
  Administered 2013-10-03: 1 via ORAL
  Filled 2013-10-03: qty 1

## 2013-10-03 MED ORDER — OXYCODONE-ACETAMINOPHEN 5-325 MG PO TABS: 1.0000 | ORAL_TABLET | ORAL | Status: DC | PRN

## 2013-10-03 MED ORDER — ALBUTEROL SULFATE HFA 108 (90 BASE) MCG/ACT IN AERS: 1.0000 | INHALATION_SPRAY | Freq: Four times a day (QID) | RESPIRATORY_TRACT | Status: DC | PRN

## 2013-10-03 NOTE — ED Notes (Signed)
Pain rt hip with radiation down to foot. Onset today, NO known injury.

## 2013-10-03 NOTE — Discharge Instructions (Signed)
Ligament Sprain A ligament sprain is when the bands of tissue that hold bones together (ligament) are stretched. HOME CARE   Rest the injured area.  Start using the joint when told to by your doctor.  Keep the injured area raised (elevated) above the level of the heart. This may lessen puffiness (swelling).  Put ice on the injured area.  Put ice in a plastic bag.  Place a towel between your skin and the bag.  Leave the ice on for 15-20 minutes, 03-04 times a day.  Wear a splint, cast, or an elastic bandage as told by your doctor.  Only take medicine as told by your doctor.  Use crutches as told by your doctor. Do not put weight on the injured joint until told to by your doctor. GET HELP RIGHT AWAY IF:   You have more bruising, puffiness, or pain.  The leg was injured and the toes are cold, tingling, numb, or blue.  The arm was injured and the fingers are cold, tingling, numb, or blue.  The pain is not helped with medicine.  The pain gets worse. MAKE SURE YOU:   Understand these instructions.  Will watch this condition.  Will get help right away if you are not doing well or get worse. Document Released: 07/12/2007 Document Revised: 11/13/2012 Document Reviewed: 07/12/2007 [facility] Patient Information 2015 [place], [place]. This information is not intended to replace advice given to you by your health care provider. Make sure you discuss any questions you have with your health care provider.  Hip Pain Your hip is the joint between your upper legs and your lower pelvis. The bones, cartilage, tendons, and muscles of your hip joint perform a lot of work each day supporting your body weight and allowing you to move around. Hip pain can range from a minor ache to severe pain in one or both of your hips. Pain may be felt on the inside of the hip joint near the groin, or the outside near the buttocks and upper thigh. You may have swelling or stiffness as well.  HOME CARE  INSTRUCTIONS   Take medicines only as directed by your health care provider.  Apply ice to the injured area:  Put ice in a plastic bag.  Place a towel between your skin and the bag.  Leave the ice on for 15-20 minutes at a time, 3-4 times a day.  Keep your leg raised (elevated) when possible to lessen swelling.  Avoid activities that cause pain.  Follow specific exercises as directed by your health care provider.  Sleep with a pillow between your legs on your most comfortable side.  Record how often you have hip pain, the location of the pain, and what it feels like. SEEK MEDICAL CARE IF:   You are unable to put weight on your leg.  Your hip is red or swollen or very tender to touch.  Your pain or swelling continues or worsens after 1 week.  You have increasing difficulty walking.  You have a fever. SEEK IMMEDIATE MEDICAL CARE IF:   You have fallen.  You have a sudden increase in pain and swelling in your hip. MAKE SURE YOU:   Understand these instructions.  Will watch your condition.  Will get help right away if you are not doing well or get worse. Document Released: 07/13/2009 Document Revised: 06/09/2013 Document Reviewed: 09/19/2012 [facility] Patient Information 2015 [place], [place]. This information is not intended to replace advice given to you by your health care  provider. Make sure you discuss any questions you have with your health care provider. ° °

## 2013-10-05 NOTE — ED Provider Notes (Signed)
CSN: [id number]     Arrival date & time 10/03/13  1912 History   First MD Initiated Contact with Patient 10/03/13 1937     Chief Complaint  Patient presents with  . Hip Pain     (Consider location/radiation/quality/duration/timing/severity/associated sxs/prior Treatment) HPI   Wesley Beck is a 43 y.o. male who presents to the Emergency Department complaining of right posterior hip pain for several hours.  He states that earlier on the day of arrival, patient jumped over a gate and landed squarely on both feet and he is unsure if that may be the reason for the pain.  He denies numbness or weakness of the leg, groin or abdominal pain, difficulty urinating, or swelling of the hip.  Pt denies previous hip problems, but does admit to chronic back pain.       History reviewed. No pertinent past surgical history. History reviewed. No pertinent family history. History  Substance Use Topics  . Smoking status: Current Every Day Smoker -- 0.50 packs/day for 25 years    Types: Cigarettes  . Smokeless tobacco: Never Used  . Alcohol Use: No    Review of Systems  Constitutional: Negative for fever and chills.  Genitourinary: Negative for dysuria and difficulty urinating.  Musculoskeletal: Positive for arthralgias. Negative for joint swelling.       Right hip pain  Skin: Negative for color change and wound.  All other systems reviewed and are negative.     Allergies  Review of patient's allergies indicates no known allergies.  Home Medications   Prior to Admission medications   Medication Sig Start Date End Date Taking? Authorizing Provider  albuterol (PROAIR HFA) 108 (90 BASE) MCG/ACT inhaler Inhale 2 puffs into the lungs every 6 (six) hours as needed for wheezing or shortness of breath.    Historical Provider, MD  albuterol (PROVENTIL HFA;VENTOLIN HFA) 108 (90 BASE) MCG/ACT inhaler Inhale 1-2 puffs into the lungs every 6 (six) hours as needed for wheezing or shortness of breath.  08/01/13   Burgess Amor, PA-C  albuterol (PROVENTIL HFA;VENTOLIN HFA) 108 (90 BASE) MCG/ACT inhaler Inhale 1-2 puffs into the lungs every 6 (six) hours as needed for wheezing or shortness of breath. 10/03/13   Amarri Michaelson L. Annalysia Willenbring, PA-C  ARIPiprazole (ABILIFY) 20 MG tablet Take 20 mg by mouth every evening.    Historical Provider, MD  diclofenac (VOLTAREN) 75 MG EC tablet Take 1 tablet (75 mg total) by mouth 2 (two) times daily. Take with food 10/03/13   Tonantzin Mimnaugh L. Wilton Thrall, PA-C  hydrocortisone cream 1 % Apply 1 application topically 2 (two) times daily.    Historical Provider, MD  oxyCODONE-acetaminophen (PERCOCET/ROXICET) 5-325 MG per tablet Take 1 tablet by mouth every 4 (four) hours as needed. 10/03/13   Johnpatrick Jenny L. Shirlie Enck, PA-C  predniSONE (DELTASONE) 20 MG tablet Take 3 tablets daily for 4 days. 08/01/13   Burgess Amor, PA-C   BP 108/89  Pulse 95  Temp(Src) 97.9 F (36.6 C) (Oral)  Resp 20  Ht  (1.753 m)  Wt 200 lb (90.719 kg)  BMI 29.52 kg/m2  SpO2 94% Physical Exam  Nursing note and vitals reviewed. Constitutional: He is oriented to person, place, and time. He appears well-developed and well-nourished. No distress.  HENT:  Head: Normocephalic and atraumatic.  Neck: Normal range of motion. Neck supple.  Cardiovascular: Normal rate, regular rhythm, normal heart sounds and intact distal pulses.   No murmur heard. Pulmonary/Chest: Effort normal. No respiratory distress. He has wheezes. He has  no rales. He exhibits no tenderness.  Mild expiratory and inspiratory wheezes  Abdominal: Soft. He exhibits no distension. There is no tenderness.  Musculoskeletal: He exhibits tenderness. He exhibits no edema.       Lumbar back: He exhibits tenderness and pain. He exhibits normal range of motion, no swelling, no deformity, no laceration and normal pulse.  ttp of the posterior and lateral right hip and SI joint.  No spinal tenderness.  DP pulses are brisk and symmetrical.  Distal sensation intact.  Hip  Flexors/Extensors are intact.  Pt has 5/5 strength against resistance of bilateral lower extremities.     Neurological: He is alert and oriented to person, place, and time. He has normal strength. No sensory deficit. He exhibits normal muscle tone. Coordination and gait normal.  Reflex Scores:      Patellar reflexes are 2+ on the right side and 2+ on the left side.      Achilles reflexes are 2+ on the right side and 2+ on the left side. Skin: Skin is warm and dry. No rash noted.    ED Course  Procedures (including critical care time) Labs Review Labs Reviewed - No data to display  Imaging Review Dg Hip Complete Right  10/03/2013   CLINICAL DATA:  Hip pain.  EXAM: RIGHT HIP - COMPLETE 2+ VIEW  COMPARISON:  None.  FINDINGS: No acute bony or joint abnormality identified. No evidence of fracture dislocation. Metallic foreign bodies noted over the right iliac wing. Over the right iliac wing.  IMPRESSION: No acute abnormality identified. Specifically no evidence of fracture or dislocation.   Electronically Signed   By: Maisie Fus  Register   On: 10/03/2013 20:11     EKG Interpretation None      MDM   Final diagnoses:  Hip sprain, right, initial encounter  Medication refill    Pt is well appearing, ambulates with a steady gait.  No focal neuro deficits, no concerning sx's for emergent neurological or infectious process.  Pt has wheezing on exam and admits to h/o of asthma and being out in his inhaler for 2 weeks.  He denies CP or shortness of breath. He appears stable for d/c and agrees to symptomatic tx with percocet, voltaren and I will refill his inhaler.  He agrees to arrange close f/u with his PMD.      Hades Mathew L. Trisha Mangle, PA-C 10/05/13 1902

## 2013-10-05 NOTE — ED Provider Notes (Signed)
Medical screening examination/treatment/procedure(s) were performed by non-physician practitioner and as supervising physician I was immediately available for consultation/collaboration.   EKG Interpretation None        Baraka Klatt L Victoriana Aziz, MD 10/05/13 2221 

## 2013-10-19 ENCOUNTER — Emergency Department (HOSPITAL_COMMUNITY): Payer: Medicaid Other

## 2013-10-19 ENCOUNTER — Emergency Department (HOSPITAL_COMMUNITY)
Admission: EM | Admit: 2013-10-19 | Discharge: 2013-10-19 | Discharge status: Against Medical Advice | Payer: Medicaid Other | Attending: Emergency Medicine | Admitting: Emergency Medicine

## 2013-10-19 ENCOUNTER — Encounter (HOSPITAL_COMMUNITY): Payer: Self-pay | Admitting: Emergency Medicine

## 2013-10-19 DIAGNOSIS — F200 Paranoid schizophrenia: Secondary | ICD-10-CM | POA: Insufficient documentation

## 2013-10-19 DIAGNOSIS — F319 Bipolar disorder, unspecified: Secondary | ICD-10-CM | POA: Diagnosis not present

## 2013-10-19 DIAGNOSIS — Z791 Long term (current) use of non-steroidal anti-inflammatories (NSAID): Secondary | ICD-10-CM | POA: Diagnosis not present

## 2013-10-19 DIAGNOSIS — M25579 Pain in unspecified ankle and joints of unspecified foot: Secondary | ICD-10-CM | POA: Insufficient documentation

## 2013-10-19 DIAGNOSIS — M129 Arthropathy, unspecified: Secondary | ICD-10-CM | POA: Insufficient documentation

## 2013-10-19 DIAGNOSIS — R079 Chest pain, unspecified: Secondary | ICD-10-CM | POA: Insufficient documentation

## 2013-10-19 DIAGNOSIS — IMO0002 Reserved for concepts with insufficient information to code with codable children: Secondary | ICD-10-CM | POA: Insufficient documentation

## 2013-10-19 DIAGNOSIS — R002 Palpitations: Secondary | ICD-10-CM | POA: Diagnosis present

## 2013-10-19 DIAGNOSIS — R61 Generalized hyperhidrosis: Secondary | ICD-10-CM | POA: Insufficient documentation

## 2013-10-19 DIAGNOSIS — Z79899 Other long term (current) drug therapy: Secondary | ICD-10-CM | POA: Insufficient documentation

## 2013-10-19 DIAGNOSIS — J45901 Unspecified asthma with (acute) exacerbation: Secondary | ICD-10-CM | POA: Diagnosis not present

## 2013-10-19 DIAGNOSIS — G8929 Other chronic pain: Secondary | ICD-10-CM | POA: Diagnosis not present

## 2013-10-19 DIAGNOSIS — F17200 Nicotine dependence, unspecified, uncomplicated: Secondary | ICD-10-CM | POA: Diagnosis not present

## 2013-10-19 LAB — CBC WITH DIFFERENTIAL/PLATELET
Basophils Absolute: 0.1 10*3/uL (ref 0.0–0.1)
Basophils Relative: 1 % (ref 0–1)
Eosinophils Absolute: 0.5 10*3/uL (ref 0.0–0.7)
Eosinophils Relative: 5 % (ref 0–5)
HCT: 42.8 % (ref 39.0–52.0)
Hemoglobin: 14.9 g/dL (ref 13.0–17.0)
LYMPHS PCT: 21 % (ref 12–46)
Lymphs Abs: 2.1 10*3/uL (ref 0.7–4.0)
MCH: 32.4 pg (ref 26.0–34.0)
MCHC: 34.8 g/dL (ref 30.0–36.0)
MCV: 93 fL (ref 78.0–100.0)
Monocytes Absolute: 1 10*3/uL (ref 0.1–1.0)
Monocytes Relative: 10 % (ref 3–12)
NEUTROS PCT: 63 % (ref 43–77)
Neutro Abs: 6.3 10*3/uL (ref 1.7–7.7)
Platelets: 359 10*3/uL (ref 150–400)
RBC: 4.6 MIL/uL (ref 4.22–5.81)
RDW: 12.8 % (ref 11.5–15.5)
WBC: 10.1 10*3/uL (ref 4.0–10.5)

## 2013-10-19 LAB — BASIC METABOLIC PANEL
Anion gap: 10 (ref 5–15)
BUN: 11 mg/dL (ref 6–23)
CO2: 26 meq/L (ref 19–32)
Calcium: 9.1 mg/dL (ref 8.4–10.5)
Chloride: 104 mEq/L (ref 96–112)
Creatinine, Ser: 0.86 mg/dL (ref 0.50–1.35)
GFR calc Af Amer: >90 mL/min (ref >90)
GFR calc non Af Amer: >90 mL/min (ref >90)
GLUCOSE: 109 mg/dL — AB (ref 70–99)
Potassium: 3.6 mEq/L — ABNORMAL LOW (ref 3.7–5.3)
SODIUM: 140 meq/L (ref 137–147)

## 2013-10-19 LAB — TROPONIN I

## 2013-10-19 NOTE — ED Notes (Signed)
Pt seen walking out of department.

## 2013-10-19 NOTE — ED Provider Notes (Signed)
CSN: [id number]     Arrival date & time 10/19/13  2126 History   This chart was scribed for Benny Lennert, MD by Gwenyth Ober, ED Scribe. This patient was seen in room APA01/APA01 and the patient's care was started at 10:10 PM.     Chief Complaint  Patient presents with  . Palpitations   Patient is a 43 y.o. male presenting with palpitations. The history is provided by the patient. No language interpreter was used.  Palpitations Palpitations quality:  Unable to specify Onset quality:  Sudden Timing:  Constant Progression:  Resolved Chronicity:  New Context: not illicit drugs   Associated symptoms: chest pain, diaphoresis and shortness of breath   Associated symptoms: no back pain, no cough and no nausea    HPI Comments: JACQUIS PAXTON is a 43 y.o. male who presents to the Emergency Department complaining of an episode of constant palpitations and chest pain that occurred earlier today, but that have since resolved. He states SOB and diaphoresis as associated symptoms at that time. He also complains of bilateral foot pain currently. Pt denies fever, abdominal pain, chills, nausea and cough. He works in [employer]ar[employer]. He denies EtOH or drug use.  Mildly lethargic   Past Medical History  Diagnosis Date  . Arthritis   . Bipolar 1 disorder   . Seasonal allergies   . Asthma   . Schizophrenia, paranoid type   . Chronic back pain   . Depression    History reviewed. No pertinent past surgical history. History reviewed. No pertinent family history. History  Substance Use Topics  . Smoking status: Current Every Day Smoker -- 0.50 packs/day for 25 years    Types: Cigarettes  . Smokeless tobacco: Never Used  . Alcohol Use: No    Review of Systems  Constitutional: Positive for diaphoresis. Negative for fever, chills, appetite change and fatigue.  HENT: Negative for congestion, ear discharge and sinus pressure.   Eyes: Negative for discharge.  Respiratory: Positive for shortness of  breath. Negative for cough.   Cardiovascular: Positive for chest pain and palpitations.  Gastrointestinal: Negative for nausea, abdominal pain and diarrhea.  Genitourinary: Negative for frequency and hematuria.  Musculoskeletal: Positive for arthralgias. Negative for back pain.  Skin: Negative for rash.  Neurological: Negative for seizures and headaches.  Psychiatric/Behavioral: Negative for hallucinations.      Allergies  Review of patient's allergies indicates no known allergies.  Home Medications   Prior to Admission medications   Medication Sig Start Date End Date Taking? Authorizing Provider  albuterol (PROAIR HFA) 108 (90 BASE) MCG/ACT inhaler Inhale 2 puffs into the lungs every 6 (six) hours as needed for wheezing or shortness of breath.    Historical Provider, MD  albuterol (PROVENTIL HFA;VENTOLIN HFA) 108 (90 BASE) MCG/ACT inhaler Inhale 1-2 puffs into the lungs every 6 (six) hours as needed for wheezing or shortness of breath. 08/01/13   Burgess Amor, PA-C  albuterol (PROVENTIL HFA;VENTOLIN HFA) 108 (90 BASE) MCG/ACT inhaler Inhale 1-2 puffs into the lungs every 6 (six) hours as needed for wheezing or shortness of breath. 10/03/13   Tammy L. Triplett, PA-C  ARIPiprazole (ABILIFY) 20 MG tablet Take 20 mg by mouth every evening.    Historical Provider, MD  diclofenac (VOLTAREN) 75 MG EC tablet Take 1 tablet (75 mg total) by mouth 2 (two) times daily. Take with food 10/03/13   Tammy L. Triplett, PA-C  hydrocortisone cream 1 % Apply 1 application topically 2 (two) times daily.  Historical Provider, MD  oxyCODONE-acetaminophen (PERCOCET/ROXICET) 5-325 MG per tablet Take 1 tablet by mouth every 4 (four) hours as needed. 10/03/13   Tammy L. Triplett, PA-C  predniSONE (DELTASONE) 20 MG tablet Take 3 tablets daily for 4 days. 08/01/13   Burgess Amor, PA-C   BP 128/76  Pulse 80  Temp(Src) 98 F (36.7 C) (Oral)  Resp 20  Ht  (1.753 m)  Wt 190 lb (86.183 kg)  BMI 28.05 kg/m2  SpO2  98% Physical Exam  Nursing note and vitals reviewed. Constitutional: He is oriented to person, place, and time. He appears well-developed.  Mildly lethargic  HENT:  Head: Normocephalic.  Eyes: Conjunctivae and EOM are normal. No scleral icterus.  Neck: Neck supple. No thyromegaly present.  Cardiovascular: Normal rate and regular rhythm.  Exam reveals no gallop and no friction rub.   No murmur heard. Pulmonary/Chest: No stridor. He has no wheezes. He has no rales. He exhibits no tenderness.  Abdominal: He exhibits no distension. There is no tenderness. There is no rebound.  Musculoskeletal: Normal range of motion. He exhibits no edema.  Lymphadenopathy:    He has no cervical adenopathy.  Neurological: He is oriented to person, place, and time. He exhibits normal muscle tone. Coordination normal.  Skin: No rash noted. No erythema.  Psychiatric: He has a normal mood and affect. His behavior is normal.    ED Course  Procedures (including critical care time)  DIAGNOSTIC STUDIES: Oxygen Saturation is 98% on RA, normal by my interpretation.    COORDINATION OF CARE: 10:15 PM - Discussed treatment plan with pt. Pt agrees to treatment plan.      Labs Review Labs Reviewed - No data to display  Imaging Review No results found.   EKG Interpretation None      MDM   Final diagnoses:  None   Pt left before given results of tests.  Pt left ama    Benny Lennert, MD 10/19/13 2322

## 2013-10-19 NOTE — ED Notes (Signed)
Pt states he woke up with palpitations, hx of same, pt denies ETOH use or drug use

## 2014-03-05 ENCOUNTER — Emergency Department (HOSPITAL_COMMUNITY)
Admission: EM | Admit: 2014-03-05 | Discharge: 2014-03-05 | Disposition: A | Discharge status: Home / Self Care | Payer: Medicaid Other | Attending: Emergency Medicine | Admitting: Emergency Medicine

## 2014-03-05 ENCOUNTER — Encounter (HOSPITAL_COMMUNITY): Payer: Self-pay | Admitting: Emergency Medicine

## 2014-03-05 DIAGNOSIS — S79911A Unspecified injury of right hip, initial encounter: Secondary | ICD-10-CM | POA: Diagnosis not present

## 2014-03-05 DIAGNOSIS — F202 Catatonic schizophrenia: Secondary | ICD-10-CM | POA: Diagnosis not present

## 2014-03-05 DIAGNOSIS — W010XXA Fall on same level from slipping, tripping and stumbling without subsequent striking against object, initial encounter: Secondary | ICD-10-CM | POA: Diagnosis not present

## 2014-03-05 DIAGNOSIS — Z79899 Other long term (current) drug therapy: Secondary | ICD-10-CM | POA: Insufficient documentation

## 2014-03-05 DIAGNOSIS — Y9389 Activity, other specified: Secondary | ICD-10-CM | POA: Insufficient documentation

## 2014-03-05 DIAGNOSIS — Z8739 Personal history of other diseases of the musculoskeletal system and connective tissue: Secondary | ICD-10-CM | POA: Insufficient documentation

## 2014-03-05 DIAGNOSIS — Y998 Other external cause status: Secondary | ICD-10-CM | POA: Insufficient documentation

## 2014-03-05 DIAGNOSIS — Z720 Tobacco use: Secondary | ICD-10-CM | POA: Insufficient documentation

## 2014-03-05 DIAGNOSIS — F319 Bipolar disorder, unspecified: Secondary | ICD-10-CM | POA: Diagnosis not present

## 2014-03-05 DIAGNOSIS — J45909 Unspecified asthma, uncomplicated: Secondary | ICD-10-CM | POA: Insufficient documentation

## 2014-03-05 DIAGNOSIS — G8929 Other chronic pain: Secondary | ICD-10-CM | POA: Diagnosis not present

## 2014-03-05 DIAGNOSIS — Y9289 Other specified places as the place of occurrence of the external cause: Secondary | ICD-10-CM | POA: Diagnosis not present

## 2014-03-05 DIAGNOSIS — M25551 Pain in right hip: Secondary | ICD-10-CM

## 2014-03-05 MED ORDER — IBUPROFEN 600 MG PO TABS: 600.0000 mg | ORAL_TABLET | Freq: Four times a day (QID) | ORAL | Status: DC | PRN

## 2014-03-05 MED ORDER — OXYCODONE-ACETAMINOPHEN 5-325 MG PO TABS
2.0000 | ORAL_TABLET | Freq: Once | ORAL | Status: AC
  Administered 2014-03-05: 2 via ORAL
  Filled 2014-03-05: qty 2

## 2014-03-05 MED ORDER — IBUPROFEN 400 MG PO TABS
600.0000 mg | ORAL_TABLET | Freq: Once | ORAL | Status: AC
  Administered 2014-03-05: 600 mg via ORAL
  Filled 2014-03-05: qty 2

## 2014-03-05 MED ORDER — OXYCODONE-ACETAMINOPHEN 5-325 MG PO TABS: 1.0000 | ORAL_TABLET | ORAL | Status: DC | PRN

## 2014-03-05 MED ORDER — CYCLOBENZAPRINE HCL 10 MG PO TABS: 10.0000 mg | ORAL_TABLET | Freq: Three times a day (TID) | ORAL | Status: DC | PRN

## 2014-03-05 NOTE — ED Provider Notes (Signed)
CSN: [id number]     Arrival date & time 03/05/14  1123 History   This chart was scribed for Wesley RazorStephen Cozette Braggs, MD by Luisa DagoPriscilla Tutu, ED Scribe. This patient was seen in room APA07/APA07 and the patient's care was started at 1:33 PM.    Chief Complaint  Patient presents with  . Hip Pain  . Leg Pain   Patient is a 44 y.o. male presenting with hip pain and leg pain. The history is provided by the patient and medical records. No language interpreter was used.  Hip Pain This is a recurrent problem. The current episode started 2 days ago. The problem occurs constantly. The problem has not changed since onset.Pertinent negatives include no chest pain, no abdominal pain, no headaches and no shortness of breath. Nothing aggravates the symptoms. Nothing relieves the symptoms. He has tried nothing for the symptoms.  Leg Pain Location:  Leg Time since incident:  2 days Injury: yes   Mechanism of injury: fall   Fall:    Fall occurred: slipped on ice.   Impact surface:  [employer]   Point of impact:  Unable to specify   Entrapped after fall: no   Leg location:  R leg Pain details:    Quality:  Aching   Radiates to:  Does not radiate   Severity:  Moderate   Onset quality:  Sudden   Duration:  2 days   Timing:  Constant   Progression:  Unchanged Chronicity:  New Dislocation: no   Associated symptoms: back pain (chronic back pain)   Associated symptoms: no decreased ROM, no fever, no muscle weakness, no numbness, no stiffness, no swelling and no tingling    HPI Comments: Wesley Beck is a 44 y.o. male with PMhx of chronic back pain presents to the Emergency Department complaining of sudden onset worsening right sided hip pain that started 2 days ago. Also complaining of associated right leg pain. Pt states that he slipped and fall face forward on ice. However, he states that the mechanism of the fall caused his current lower extremity pain. Pt states that he was seen here previously for right  hip pain after trying to jump a 6 foot fence. He was d/c home with percocet at the time, which relieved his symptoms. Pt denies fever, neck pain, sore throat, visual disturbance, CP, cough, SOB, abdominal pain, nausea, emesis, diarrhea, urinary symptoms, HA, weakness, numbness and rash as associated symptoms.  .    Past Medical History  Diagnosis Date  . Arthritis   . Bipolar 1 disorder   . Seasonal allergies   . Asthma   . Schizophrenia, paranoid type   . Chronic back pain   . Depression    History reviewed. No pertinent past surgical history. History reviewed. No pertinent family history. History  Substance Use Topics  . Smoking status: Current Every Day Smoker -- 0.50 packs/day for 25 years    Types: Cigarettes  . Smokeless tobacco: Never Used  . Alcohol Use: No    Review of Systems  Constitutional: Negative for fever.  Respiratory: Negative for shortness of breath.   Cardiovascular: Negative for chest pain.  Gastrointestinal: Negative for abdominal pain.  Musculoskeletal: Positive for back pain (chronic back pain) and arthralgias. Negative for myalgias, joint swelling and stiffness.  Neurological: Negative for weakness, numbness and headaches.  All other systems reviewed and are negative.  Allergies  Review of patient's allergies indicates no known allergies.  Home Medications   Prior to Admission medications  Medication Sig Start Date End Date Taking? Authorizing Provider  ARIPiprazole (ABILIFY) 20 MG tablet Take 20 mg by mouth every evening.   Yes Historical Provider, MD  ibuprofen (ADVIL,MOTRIN) 200 MG tablet Take 400 mg by mouth every 6 (six) hours as needed for moderate pain.   Yes Historical Provider, MD  PROAIR HFA 108 (90 BASE) MCG/ACT inhaler Inhale 1 puff into the lungs every 6 (six) hours as needed for wheezing or shortness of breath.  02/12/14  Yes Historical Provider, MD  thiothixene (NAVANE) 10 MG capsule Take 1 capsule by mouth daily. 02/10/14  Yes Historical  Provider, MD  traMADol (ULTRAM) 50 MG tablet Take 50 mg by mouth 2 (two) times daily. 02/17/14  Yes Historical Provider, MD   BP 131/72 mmHg  Pulse 69  Temp(Src) 98.3 F (36.8 C) (Oral)  Resp 16  SpO2 99%  Physical Exam  Constitutional: He appears well-developed and well-nourished. No distress.  HENT:  Head: Normocephalic and atraumatic.  Eyes: Conjunctivae are normal. Right eye exhibits no discharge. Left eye exhibits no discharge.  Neck: Neck supple.  Cardiovascular: Normal rate, regular rhythm and normal heart sounds.  Exam reveals no gallop and no friction rub.   No murmur heard. Palpable DP pulses.   Pulmonary/Chest: Effort normal and breath sounds normal. No respiratory distress.  Abdominal: Soft. He exhibits no distension. There is no tenderness.  Musculoskeletal: He exhibits no edema or tenderness.  Mild tenderness to palpation paraspinally to the lumbar region of the right. Able to actively range his right hip with no apparent difficulty.  Neurological: He is alert.  Strength 5/5 to lower extremities.  Skin: Skin is warm and dry.  Psychiatric: He has a normal mood and affect. His behavior is normal. Thought content normal.  Nursing note and vitals reviewed.   ED Course  Procedures (including critical care time)  DIAGNOSTIC STUDIES: Oxygen Saturation is 99% on RA, normal by my interpretation.    COORDINATION OF CARE: 1:40 PM- Pt advised of plan for treatment and pt agrees.  Labs Review Labs Reviewed - No data to display  Imaging Review No results found.   EKG Interpretation None      MDM   Final diagnoses:  Right hip pain    43yM with R hip pain. No direct trauma. NVI. Able to actively range through out ROM and bear weight. I suspect imaging or little utility. Symptomatic tx. Return precautions discussed.   I personally preformed the services scribed in my presence. The recorded information has been reviewed is accurate. Wesley Razor,  MD.    Wesley Razor, MD 03/10/14 [phone]

## 2014-03-05 NOTE — Discharge Instructions (Signed)

## 2014-03-05 NOTE — ED Notes (Signed)
PT c/o worsening in chronic hip pain x3 days. PT states he ran out of his tramadol and the oxycodone he received his last ED visit was effective for his pain.

## 2014-03-05 NOTE — ED Notes (Signed)
Having pain to right hip and eight leg.  Rates pain 9.

## 2015-03-24 IMAGING — CR DG CHEST 1V
1 series · 1 of 1 positions shown · non-contrast
Comparison: 09/20/2012

CLINICAL DATA: Cold symptoms, shortness of Breath

EXAM:
CHEST - 1 VIEW

[w chest pa]
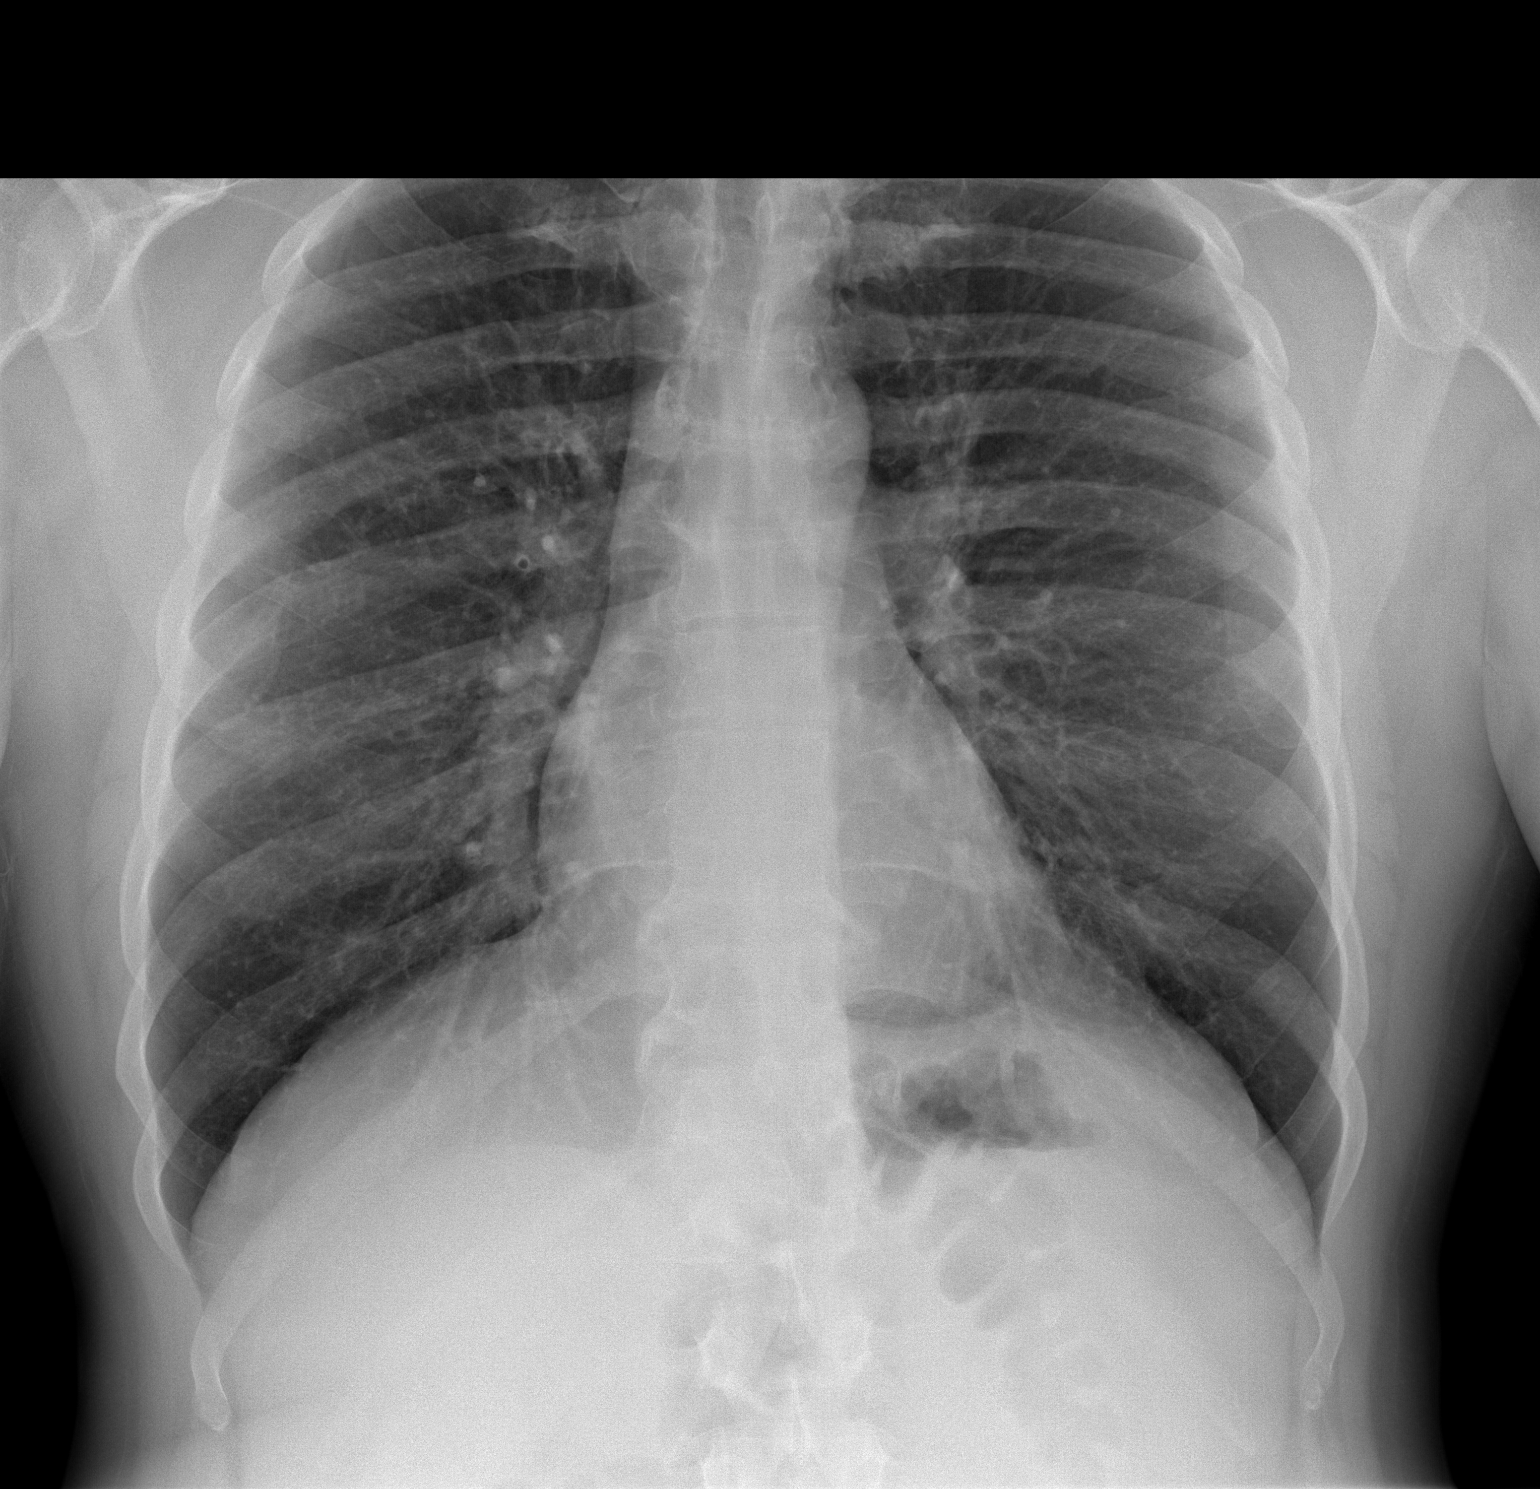

[1 of 1 positions shown; findings below may reference images not displayed]

FINDINGS: Cardiomediastinal silhouette is stable. No acute infiltrate or
pleural effusion. No pulmonary edema. Central mild bronchitic
changes.
IMPRESSION: No acute infiltrate or pulmonary edema. Central mild bronchitic
changes.

## 2018-04-02 ENCOUNTER — Other Ambulatory Visit: Payer: Self-pay

## 2018-04-02 ENCOUNTER — Encounter: Payer: Self-pay | Admitting: Psychiatry

## 2018-04-02 ENCOUNTER — Inpatient Hospital Stay
Admission: AD | Admit: 2018-04-02 | Discharge: 2018-04-05 | DRG: 885 | Disposition: A | Discharge status: Home / Self Care | Payer: Medicare Other | Source: Other Acute Inpatient Hospital | Attending: Psychiatry | Admitting: Psychiatry

## 2018-04-02 DIAGNOSIS — Z791 Long term (current) use of non-steroidal anti-inflammatories (NSAID): Secondary | ICD-10-CM

## 2018-04-02 DIAGNOSIS — G8929 Other chronic pain: Secondary | ICD-10-CM | POA: Diagnosis present

## 2018-04-02 DIAGNOSIS — M549 Dorsalgia, unspecified: Secondary | ICD-10-CM | POA: Diagnosis present

## 2018-04-02 DIAGNOSIS — Z915 Personal history of self-harm: Secondary | ICD-10-CM

## 2018-04-02 DIAGNOSIS — Z79891 Long term (current) use of opiate analgesic: Secondary | ICD-10-CM

## 2018-04-02 DIAGNOSIS — J45909 Unspecified asthma, uncomplicated: Secondary | ICD-10-CM | POA: Diagnosis present

## 2018-04-02 DIAGNOSIS — F1210 Cannabis abuse, uncomplicated: Secondary | ICD-10-CM

## 2018-04-02 DIAGNOSIS — F17210 Nicotine dependence, cigarettes, uncomplicated: Secondary | ICD-10-CM | POA: Diagnosis present

## 2018-04-02 DIAGNOSIS — R45851 Suicidal ideations: Secondary | ICD-10-CM | POA: Diagnosis present

## 2018-04-02 DIAGNOSIS — M1990 Unspecified osteoarthritis, unspecified site: Secondary | ICD-10-CM | POA: Diagnosis present

## 2018-04-02 DIAGNOSIS — T1491XA Suicide attempt, initial encounter: Secondary | ICD-10-CM | POA: Diagnosis present

## 2018-04-02 DIAGNOSIS — F259 Schizoaffective disorder, unspecified: Secondary | ICD-10-CM

## 2018-04-02 DIAGNOSIS — Z79899 Other long term (current) drug therapy: Secondary | ICD-10-CM | POA: Diagnosis not present

## 2018-04-02 DIAGNOSIS — Z9119 Patient's noncompliance with other medical treatment and regimen: Secondary | ICD-10-CM | POA: Diagnosis not present

## 2018-04-02 DIAGNOSIS — F250 Schizoaffective disorder, bipolar type: Secondary | ICD-10-CM | POA: Diagnosis not present

## 2018-04-02 MED ORDER — TRAZODONE HCL 50 MG PO TABS
50.0000 mg | ORAL_TABLET | Freq: Every evening | ORAL | Status: DC | PRN
  Administered 2018-04-02 – 2018-04-04 (×2): 50 mg via ORAL
  Filled 2018-04-02 (×2): qty 1

## 2018-04-02 MED ORDER — OLANZAPINE 10 MG PO TABS
10.0000 mg | ORAL_TABLET | Freq: Every day | ORAL | Status: DC
  Administered 2018-04-02 – 2018-04-04 (×3): 10 mg via ORAL
  Filled 2018-04-02 (×3): qty 1

## 2018-04-02 MED ORDER — IBUPROFEN 600 MG PO TABS
600.0000 mg | ORAL_TABLET | Freq: Four times a day (QID) | ORAL | Status: DC | PRN
Start: 2018-04-02 — End: 2018-04-05
  Administered 2018-04-04: 600 mg via ORAL
  Filled 2018-04-02: qty 1

## 2018-04-02 MED ORDER — HYDROXYZINE HCL 25 MG PO TABS
25.0000 mg | ORAL_TABLET | Freq: Four times a day (QID) | ORAL | Status: DC | PRN
  Administered 2018-04-02: 25 mg via ORAL
  Filled 2018-04-02 (×2): qty 1

## 2018-04-02 MED ORDER — ALUM & MAG HYDROXIDE-SIMETH 200-200-20 MG/5ML PO SUSP: 30.0000 mL | ORAL | Status: DC | PRN

## 2018-04-02 MED ORDER — ALBUTEROL SULFATE HFA 108 (90 BASE) MCG/ACT IN AERS
1.0000 | INHALATION_SPRAY | Freq: Four times a day (QID) | RESPIRATORY_TRACT | Status: DC | PRN
Start: 2018-04-02 — End: 2018-04-05
  Administered 2018-04-02 – 2018-04-05 (×4): 2 via RESPIRATORY_TRACT
  Filled 2018-04-02: qty 6.7

## 2018-04-02 MED ORDER — ACETAMINOPHEN 325 MG PO TABS: 650.0000 mg | ORAL_TABLET | Freq: Four times a day (QID) | ORAL | Status: DC | PRN

## 2018-04-02 MED ORDER — ALBUTEROL SULFATE (2.5 MG/3ML) 0.083% IN NEBU: 2.5000 mg | INHALATION_SOLUTION | Freq: Four times a day (QID) | RESPIRATORY_TRACT | Status: DC | PRN

## 2018-04-02 MED ORDER — MAGNESIUM HYDROXIDE 400 MG/5ML PO SUSP: 30.0000 mL | Freq: Every day | ORAL | Status: DC | PRN

## 2018-04-02 NOTE — Progress Notes (Signed)
Patient experiencing some minor wheezing at this time. Pt. Given PRN inhaler to use.

## 2018-04-02 NOTE — Plan of Care (Signed)
Patient just recently admitted to the unit. Patient has not had sufficient time to show progressions at this time. Will continue to monitor for progressions.   Problem: Education: Goal: Knowledge of [facility] Education information/materials will improve Outcome: Not Progressing Goal: Emotional status will improve Outcome: Not Progressing Goal: Mental status will improve Outcome: Not Progressing Goal: Verbalization of understanding the information provided will improve Outcome: Not Progressing   Problem: Activity: Goal: Interest or engagement in activities will improve Outcome: Not Progressing Goal: Sleeping patterns will improve Outcome: Not Progressing   Problem: Coping: Goal: Ability to verbalize frustrations and anger appropriately will improve Outcome: Not Progressing Goal: Ability to demonstrate self-control will improve Outcome: Not Progressing   Problem: Health Behavior/Discharge Planning: Goal: Identification of resources available to assist in meeting health care needs will improve Outcome: Not Progressing Goal: Compliance with treatment plan for underlying cause of condition will improve Outcome: Not Progressing   Problem: Physical Regulation: Goal: Ability to maintain clinical measurements within normal limits will improve Outcome: Not Progressing   Problem: Safety: Goal: Periods of time without injury will increase Outcome: Not Progressing   Problem: Coping: Goal: Coping ability will improve Outcome: Not Progressing Goal: Will verbalize feelings Outcome: Not Progressing   Problem: Health Behavior/Discharge Planning: Goal: Ability to make decisions will improve Outcome: Not Progressing Goal: Compliance with therapeutic regimen will improve Outcome: Not Progressing   Problem: Safety: Goal: Ability to disclose and discuss suicidal ideas will improve Outcome: Not Progressing Goal: Ability to identify and utilize support systems that promote  safety will improve Outcome: Not Progressing   Problem: Self-Concept: Goal: Will verbalize positive feelings about self Outcome: Not Progressing Goal: Level of anxiety will decrease Outcome: Not Progressing   Problem: Medication: Goal: Compliance with prescribed medication regimen will improve Outcome: Not Progressing   Problem: Self-Concept: Goal: Ability to disclose and discuss suicidal ideas will improve Outcome: Not Progressing Goal: Will verbalize positive feelings about self Outcome: Not Progressing
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
## 2018-04-02 NOTE — Tx Team (Signed)
Initial Treatment Plan 04/02/2018 10:58 PM Wesley Beck FSF:[id number]    PATIENT STRESSORS: Financial difficulties Marital or family conflict   PATIENT STRENGTHS: Ability for insight Communication skills Motivation for treatment/growth Physical Health Supportive family/friends   PATIENT IDENTIFIED PROBLEMS: Suicidal thoughts 04/02/2018  Depression 04/02/2018  Stress 04/02/2018                 DISCHARGE CRITERIA:  Improved stabilization in mood, thinking, and/or behavior Motivation to continue treatment in a less acute level of care Need for constant or close observation no longer present Verbal commitment to aftercare and medication compliance  PRELIMINARY DISCHARGE PLAN: Outpatient therapy Participate in family therapy Return to previous living arrangement Return to previous work or school arrangements  PATIENT/FAMILY INVOLVEMENT: This treatment plan has been presented to and reviewed with the patient, Wesley Beck.  The patient has been given the opportunity to ask questions and make suggestions.  Lenox Ponds, RN 04/02/2018, 10:58 PM

## 2018-04-02 NOTE — Care Plan (Signed)
Chart reviewed.  Patient meets criteria for inpatient psychiatric admission. Wesley Beck is a 48 y.o. male who presented to outside hospital emergency department after hand writing a will with a plan to complete a suicide by jumping off of a bridge.  Patient has a history of suicide attempt by overdose. Patient self reports a history of schizophrenia and bipolar disorder.  Home medication olanzapine 10 mg daily. Medication reconciliation shows patient being prescribed Navane and Abilify. Of note, patient had slight elevations in hemoglobin, hematocrit and ammonia at outside hospital. Orders placed.  Mariel Craft, MD

## 2018-04-03 DIAGNOSIS — F250 Schizoaffective disorder, bipolar type: Secondary | ICD-10-CM

## 2018-04-03 DIAGNOSIS — F1210 Cannabis abuse, uncomplicated: Secondary | ICD-10-CM

## 2018-04-03 DIAGNOSIS — F259 Schizoaffective disorder, unspecified: Secondary | ICD-10-CM

## 2018-04-03 MED ORDER — MONTELUKAST SODIUM 10 MG PO TABS
10.0000 mg | ORAL_TABLET | Freq: Every day | ORAL | Status: DC
  Administered 2018-04-03 – 2018-04-04 (×2): 10 mg via ORAL
  Filled 2018-04-03 (×2): qty 1

## 2018-04-03 MED ORDER — LORATADINE 10 MG PO TABS
10.0000 mg | ORAL_TABLET | Freq: Every day | ORAL | Status: DC
  Administered 2018-04-03 – 2018-04-05 (×3): 10 mg via ORAL
  Filled 2018-04-03 (×3): qty 1
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
## 2018-04-03 NOTE — Plan of Care (Signed)
Patient  knowledgeable of information  received , verbalize understanding .  Emotional and mental status improving . Attending unit programing  . Voice no concerns around sleep. Working on Copy . No anger outburst. Denies suicidal  ideations . Compliant with  medication given .  Problem: Education: Goal: Knowledge of [facility] Education information/materials will improve Outcome: Progressing Goal: Emotional status will improve Outcome: Progressing Goal: Mental status will improve Outcome: Progressing Goal: Verbalization of understanding the information provided will improve Outcome: Progressing   Problem: Activity: Goal: Interest or engagement in activities will improve Outcome: Progressing Goal: Sleeping patterns will improve Outcome: Progressing   Problem: Coping: Goal: Ability to verbalize frustrations and anger appropriately will improve Outcome: Progressing Goal: Ability to demonstrate self-control will improve Outcome: Progressing   Problem: Health Behavior/Discharge Planning: Goal: Identification of resources available to assist in meeting health care needs will improve Outcome: Progressing Goal: Compliance with treatment plan for underlying cause of condition will improve Outcome: Progressing   Problem: Physical Regulation: Goal: Ability to maintain clinical measurements within normal limits will improve Outcome: Progressing   Problem: Safety: Goal: Periods of time without injury will increase Outcome: Progressing   Problem: Coping: Goal: Coping ability will improve Outcome: Progressing Goal: Will verbalize feelings Outcome: Progressing   Problem: Health Behavior/Discharge Planning: Goal: Ability to make decisions will improve Outcome: Progressing Goal: Compliance with therapeutic regimen will improve Outcome: Progressing   Problem: Safety: Goal: Ability to disclose and discuss suicidal ideas will improve Outcome:  Progressing Goal: Ability to identify and utilize support systems that promote safety will improve Outcome: Progressing   Problem: Self-Concept: Goal: Will verbalize positive feelings about self Outcome: Progressing Goal: Level of anxiety will decrease Outcome: Progressing   Problem: Medication: Goal: Compliance with prescribed medication regimen will improve Outcome: Progressing   Problem: Self-Concept: Goal: Ability to disclose and discuss suicidal ideas will improve Outcome: Progressing Goal: Will verbalize positive feelings about self Outcome: Progressing
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
## 2018-04-03 NOTE — [facility] Group Notes (Signed)
LCSW Group Therapy Note  04/03/2018 1:00 PM  Type of Therapy/Topic:  Group Therapy:  Emotion Regulation  Participation Level:  Minimal   Description of Group:   The purpose of this group is to assist patients in learning to regulate negative emotions and experience positive emotions. Patients will be guided to discuss ways in which they have been vulnerable to their negative emotions. These vulnerabilities will be juxtaposed with experiences of positive emotions or situations, and patients will be challenged to use positive emotions to combat negative ones. Special emphasis will be placed on coping with negative emotions in conflict situations, and patients will process healthy conflict resolution skills.  Therapeutic Goals: 1. Patient will identify two positive emotions or experiences to reflect on in order to balance out negative emotions 2. Patient will label two or more emotions that they find the most difficult to experience 3. Patient will demonstrate positive conflict resolution skills through discussion and/or role plays  Summary of Patient Progress: Patient was present in group, however, did not engage in group discussion.    Therapeutic Modalities:   Cognitive Behavioral Therapy Feelings Identification Dialectical Behavioral Therapy  Penni Homans, MSW, LCSW 04/03/2018 2:13 PM

## 2018-04-03 NOTE — [facility] Suicide Risk Assessment (Signed)
[facility] INPATIENT:  Family/Significant Other Suicide Prevention Education  Suicide Prevention Education:  Patient Refusal for Family/Significant Other Suicide Prevention Education: The patient Wesley Beck has refused to provide written consent for family/significant other to be provided Family/Significant Other Suicide Prevention Education during admission and/or prior to discharge.  Physician notified.  SPE completed with pt, as pt refused to consent to family contact. SPI pamphlet provided to pt and pt was encouraged to share information with support network, ask questions, and talk about any concerns relating to SPE. Pt denies access to guns/firearms and verbalized understanding of information provided. Mobile Crisis information also provided to pt.    Charlann Lange Nichoel Digiulio MSW LCSW 04/03/2018, 9:56 AM

## 2018-04-03 NOTE — Progress Notes (Signed)
Recreation Therapy Notes   Date: 04/03/2018  Time: 9:30 am   Location: Craft room   Behavioral response: N/A   Intervention Topic: Problem Solving   Discussion/Intervention: Patient did not attend group.   Clinical Observations/Feedback:  Patient did not attend group.   [name] LRT/CTRS        Wesley Beck 04/03/2018 11:43 AM 

## 2018-04-03 NOTE — Tx Team (Signed)
Interdisciplinary Treatment and Diagnostic Plan Update  04/03/2018 Time of Session: 230PM Wesley Beck MRN: [id number]  Principal Diagnosis: Schizoaffective disorder [facility])  Secondary Diagnoses: Principal Problem:   Schizoaffective disorder (HCC) Active Problems:   Asthma   Suicidal behavior with attempted self-injury (HCC)   Cannabis abuse   Current Medications:  Current Facility-Administered Medications  Medication Dose Route Frequency Provider Last Rate Last Dose  . acetaminophen (TYLENOL) tablet 650 mg  650 mg Oral Q6H PRN Mariel Craft, MD      . albuterol (PROVENTIL HFA;VENTOLIN HFA) 108 (90 Base) MCG/ACT inhaler 1-2 puff  1-2 puff Inhalation Q6H PRN Clapacs, Jackquline Denmark, MD   2 puff at 04/03/18 1248  . alum & mag hydroxide-simeth (MAALOX/MYLANTA) 200-200-20 MG/5ML suspension 30 mL  30 mL Oral Q4H PRN Mariel Craft, MD      . hydrOXYzine (ATARAX/VISTARIL) tablet 25 mg  25 mg Oral Q6H PRN Mariel Craft, MD   25 mg at 04/02/18 2214  . ibuprofen (ADVIL,MOTRIN) tablet 600 mg  600 mg Oral Q6H PRN Mariel Craft, MD      . loratadine (CLARITIN) tablet 10 mg  10 mg Oral Daily Clapacs, Jackquline Denmark, MD   10 mg at 04/03/18 1255  . magnesium hydroxide (MILK OF MAGNESIA) suspension 30 mL  30 mL Oral Daily PRN Mariel Craft, MD      . montelukast (SINGULAIR) tablet 10 mg  10 mg Oral QHS Clapacs, John T, MD      . OLANZapine (ZYPREXA) tablet 10 mg  10 mg Oral QHS Mariel Craft, MD   10 mg at 04/02/18 2214  . traZODone (DESYREL) tablet 50 mg  50 mg Oral QHS PRN Mariel Craft, MD   50 mg at 04/02/18 2214   PTA Medications: Medications Prior to Admission  Medication Sig Dispense Refill Last Dose  . ARIPiprazole (ABILIFY) 20 MG tablet Take 20 mg by mouth every evening.   03/05/2014  . cyclobenzaprine (FLEXERIL) 10 MG tablet Take 1 tablet (10 mg total) by mouth 3 (three) times daily as needed for muscle spasms. 12 tablet 0   . ibuprofen (ADVIL,MOTRIN) 200 MG tablet Take 400 mg by  mouth every 6 (six) hours as needed for moderate pain.   03/05/2014 at Unknown time  . ibuprofen (ADVIL,MOTRIN) 600 MG tablet Take 1 tablet (600 mg total) by mouth every 6 (six) hours as needed. 30 tablet 0   . oxyCODONE-acetaminophen (PERCOCET/ROXICET) 5-325 MG per tablet Take 1 tablet by mouth every 4 (four) hours as needed for severe pain. 12 tablet 0   . PROAIR HFA 108 (90 BASE) MCG/ACT inhaler Inhale 1 puff into the lungs every 6 (six) hours as needed for wheezing or shortness of breath.   0 03/05/2014  . thiothixene (NAVANE) 10 MG capsule Take 1 capsule by mouth daily.  0 03/05/2014  . traMADol (ULTRAM) 50 MG tablet Take 50 mg by mouth 2 (two) times daily.  0 Past Week at Unknown time    Patient Stressors: Financial difficulties Marital or family conflict  Patient Strengths: Ability for Warden/ranger for treatment/growth Physical Health Supportive family/friends  Treatment Modalities: Medication Management, Group therapy, Case management,  1 to 1 session with clinician, Psychoeducation, Recreational therapy.   Physician Treatment Plan for Primary Diagnosis: Schizoaffective disorder [facility] Long Term Goal(s): Improvement in symptoms so as ready for discharge Improvement in symptoms so as ready for discharge   Short Term Goals: Ability to verbalize feelings will improve Ability  to disclose and discuss suicidal ideas Compliance with prescribed medications will improve Ability to identify triggers associated with substance abuse/mental health issues will improve  Medication Management: Evaluate patient's response, side effects, and tolerance of medication regimen.  Therapeutic Interventions: 1 to 1 sessions, Unit Group sessions and Medication administration.  Evaluation of Outcomes: Progressing  Physician Treatment Plan for Secondary Diagnosis: Principal Problem:   Schizoaffective disorder (HCC) Active Problems:   Asthma   Suicidal behavior with  attempted self-injury (HCC)   Cannabis abuse  Long Term Goal(s): Improvement in symptoms so as ready for discharge Improvement in symptoms so as ready for discharge   Short Term Goals: Ability to verbalize feelings will improve Ability to disclose and discuss suicidal ideas Compliance with prescribed medications will improve Ability to identify triggers associated with substance abuse/mental health issues will improve     Medication Management: Evaluate patient's response, side effects, and tolerance of medication regimen.  Therapeutic Interventions: 1 to 1 sessions, Unit Group sessions and Medication administration.  Evaluation of Outcomes: Progressing   RN Treatment Plan for Primary Diagnosis: Schizoaffective disorder (HCC) Long Term Goal(s): Knowledge of disease and therapeutic regimen to maintain health will improve  Short Term Goals: Ability to verbalize frustration and anger appropriately will improve, Ability to demonstrate self-control and Ability to verbalize feelings will improve  Medication Management: RN will administer medications as ordered by provider, will assess and evaluate patient's response and provide education to patient for prescribed medication. RN will report any adverse and/or side effects to prescribing provider.  Therapeutic Interventions: 1 on 1 counseling sessions, Psychoeducation, Medication administration, Evaluate responses to treatment, Monitor vital signs and CBGs as ordered, Perform/monitor CIWA, COWS, AIMS and Fall Risk screenings as ordered, Perform wound care treatments as ordered.  Evaluation of Outcomes: Progressing   LCSW Treatment Plan for Primary Diagnosis: Schizoaffective disorder (HCC) Long Term Goal(s): Safe transition to appropriate next level of care at discharge, Engage patient in therapeutic group addressing interpersonal concerns.  Short Term Goals: Engage patient in aftercare planning with referrals and resources, Increase social  support, Increase emotional regulation, Facilitate acceptance of mental health diagnosis and concerns and Increase skills for wellness and recovery  Therapeutic Interventions: Assess for all discharge needs, 1 to 1 time with Social worker, Explore available resources and support systems, Assess for adequacy in community support network, Educate family and significant other(s) on suicide prevention, Complete Psychosocial Assessment, Interpersonal group therapy.  Evaluation of Outcomes: Progressing   Progress in Treatment: Attending groups: Yes. Participating in groups: Yes. Taking medication as prescribed: Yes. Toleration medication: Yes. Family/Significant other contact made: No, will contact:  Pt declined collateral contact Patient understands diagnosis: Yes. Discussing patient identified problems/goals with staff: Yes. Medical problems stabilized or resolved: Yes. Denies suicidal/homicidal ideation: Yes. Issues/concerns per patient self-inventory: No. Other: n/a  New problem(s) identified: No, Describe:  none  New Short Term/Long Term Goal(s): medication management for mood stabilization; elimination of SI thoughts; development of comprehensive mental wellness/sobriety plan.   Patient Goals:  "To find a mediator or mediation" for me and my son.  Discharge Plan or Barriers: SPE pamphlet, Mobile Crisis information, and AA/NA information provided to patient for additional community support and resources.   Reason for Continuation of Hospitalization: Depression  Estimated Length of Stay: Friday 04/06/2018  Attendees: Patient: Wesley Beck 04/03/2018 3:38 PM  Physician: Dr Toni Amend MD 04/03/2018 3:38 PM  Nursing:  04/03/2018 3:38 PM  RN Care Manager: 04/03/2018 3:38 PM  Social Worker: Zollie Scale Chelesa Weingartner LCSW 04/03/2018 3:38 PM  Recreational Therapist:  04/03/2018 3:38 PM  Other: Penni Homans LCSW 04/03/2018 3:38 PM  Other: Lowella Dandy LCSW 04/03/2018 3:38 PM  Other: 04/03/2018  3:38 PM    Scribe for Treatment Team: Charlann Lange Imanol Bihl, LCSW 04/03/2018 3:38 PM

## 2018-04-04 MED ORDER — TRAZODONE HCL 50 MG PO TABS: 50.0000 mg | ORAL_TABLET | Freq: Every evening | ORAL | 1 refills | Status: DC | PRN

## 2018-04-04 MED ORDER — LORATADINE 10 MG PO TABS: 10.0000 mg | ORAL_TABLET | Freq: Every day | ORAL | 1 refills | Status: DC

## 2018-04-04 MED ORDER — MONTELUKAST SODIUM 10 MG PO TABS: 10.0000 mg | ORAL_TABLET | Freq: Every day | ORAL | 1 refills | Status: DC

## 2018-04-04 MED ORDER — ALBUTEROL SULFATE HFA 108 (90 BASE) MCG/ACT IN AERS: 1.0000 | INHALATION_SPRAY | Freq: Four times a day (QID) | RESPIRATORY_TRACT | 1 refills | Status: DC | PRN

## 2018-04-04 MED ORDER — OLANZAPINE 10 MG PO TABS: 10.0000 mg | ORAL_TABLET | Freq: Every day | ORAL | 1 refills | Status: DC

## 2018-04-04 NOTE — B[facility] Group Notes (Signed)
Balance In Life 04/04/2018 1PM  Type of Therapy/Topic:  Group Therapy:  Balance in Life  Participation Level:  Minimal  Description of Group:   This group will address the concept of balance and how it feels and looks when one is unbalanced. Patients will be encouraged to process areas in their lives that are out of balance and identify reasons for remaining unbalanced. Facilitators will guide patients in utilizing problem-solving interventions to address and correct the stressor making their life unbalanced. Understanding and applying boundaries will be explored and addressed for obtaining and maintaining a balanced life. Patients will be encouraged to explore ways to assertively make their unbalanced needs known to significant others in their lives, using other group members and facilitator for support and feedback.  Therapeutic Goals: 1. Patient will identify two or more emotions or situations they have that consume much of in their lives. 2. Patient will identify signs/triggers that life has become out of balance:  3. Patient will identify two ways to set boundaries in order to achieve balance in their lives:  4. Patient will demonstrate ability to communicate their needs through discussion and/or role plays  Summary of Patient Progress: Minimal participation during, sat quietly during group session.    Therapeutic Modalities:   Cognitive Behavioral Therapy Solution-Focused Therapy Assertiveness Training  Linnell Swords Philip Aspen, LCSW

## 2018-04-04 NOTE — Progress Notes (Signed)
[facility] MD Progress Note  04/04/2018 3:54 PM Wesley Beck  MRN:  [id number] Subjective: Patient seen chart reviewed.  Patient reports he is feeling well today.  Denies being suicidal or homicidal.  Denies being depressed.  He is pacing less seems less anxious.  Thoughts appear to have cleared up and are more organized.  Not having any trouble breathing today.  Felt sleepy last night and says he got a good night sleep but still a little tired this morning.  No new complaints. Principal Problem: Schizoaffective disorder (HCC) Diagnosis: Principal Problem:   Schizoaffective disorder (HCC) Active Problems:   Asthma   Suicidal behavior with attempted self-injury (HCC)   Cannabis abuse  Total Time spent with patient: 30 minutes  Past Psychiatric History: Past history of recurrent psychotic and mood disorder.  Good response to modest dose antipsychotic  Past Medical History:  Past Medical History:  Diagnosis Date  . Arthritis   . Asthma   . Bipolar 1 disorder (HCC)   . Chronic back pain   . Depression   . Schizophrenia, paranoid type (HCC)   . Seasonal allergies    History reviewed. No pertinent surgical history. Family History: History reviewed. No pertinent family history. Family Psychiatric  History: None Social History:  Social History   Substance and Sexual Activity  Alcohol Use No     Social History   Substance and Sexual Activity  Drug Use Yes  . Types: Cocaine, Marijuana   Comment: Says he has not used lately  10/19/13    Social History   Socioeconomic History  . Marital status: Single    Spouse name: Not on file  . Number of children: Not on file  . Years of education: Not on file  . Highest education level: Not on file  Occupational History  . Not on file  Social Needs  . Financial resource strain: Not on file  . Food insecurity:    Worry: Not on file    Inability: Not on file  . Transportation needs:    Medical: Not on file    Non-medical: Not on file   Tobacco Use  . Smoking status: Current Every Day Smoker    Packs/day: 0.50    Years: 25.00    Pack years: 12.50    Types: Cigarettes  . Smokeless tobacco: Never Used  Substance and Sexual Activity  . Alcohol use: No  . Drug use: Yes    Types: Cocaine, Marijuana    Comment: Says he has not used lately  10/19/13  . Sexual activity: Not on file  Lifestyle  . Physical activity:    Days per week: Not on file    Minutes per session: Not on file  . Stress: Not on file  Relationships  . Social connections:    Talks on phone: Not on file    Gets together: Not on file    Attends religious service: Not on file    Active member of club or organization: Not on file    Attends meetings of clubs or organizations: Not on file    Relationship status: Not on file  Other Topics Concern  . Not on file  Social History Narrative  . Not on file   Additional Social History:                         Sleep: Fair  Appetite:  Fair  Current Medications: Current Facility-Administered Medications  Medication Dose Route Frequency Provider  Last Rate Last Dose  . acetaminophen (TYLENOL) tablet 650 mg  650 mg Oral Q6H PRN Mariel Craft, MD      . albuterol (PROVENTIL HFA;VENTOLIN HFA) 108 (90 Base) MCG/ACT inhaler 1-2 puff  1-2 puff Inhalation Q6H PRN Clapacs, Jackquline Denmark, MD   2 puff at 04/04/18 0935  . alum & mag hydroxide-simeth (MAALOX/MYLANTA) 200-200-20 MG/5ML suspension 30 mL  30 mL Oral Q4H PRN Mariel Craft, MD      . hydrOXYzine (ATARAX/VISTARIL) tablet 25 mg  25 mg Oral Q6H PRN Mariel Craft, MD   25 mg at 04/02/18 2214  . ibuprofen (ADVIL,MOTRIN) tablet 600 mg  600 mg Oral Q6H PRN Mariel Craft, MD      . loratadine (CLARITIN) tablet 10 mg  10 mg Oral Daily Clapacs, Jackquline Denmark, MD   10 mg at 04/04/18 0831  . magnesium hydroxide (MILK OF MAGNESIA) suspension 30 mL  30 mL Oral Daily PRN Mariel Craft, MD      . montelukast (SINGULAIR) tablet 10 mg  10 mg Oral QHS Clapacs, Jackquline Denmark, MD   10 mg at 04/03/18 2128  . OLANZapine (ZYPREXA) tablet 10 mg  10 mg Oral QHS Mariel Craft, MD   10 mg at 04/03/18 2128  . traZODone (DESYREL) tablet 50 mg  50 mg Oral QHS PRN Mariel Craft, MD   50 mg at 04/02/18 2214    Lab Results: No results found for this or any previous visit (from the past 48 hour(s)).  Blood Alcohol level:  Lab Results  Component Value Date   [facility] <11 08/01/2013   ETH <11 05/26/2013    Metabolic Disorder Labs: No results found for: HGBA1C, MPG No results found for: PROLACTIN Lab Results  Component Value Date   CHOL 176 06/13/2006   TRIG 182 (H) 06/13/2006   HDL 35 (L) 06/13/2006   CHOLHDL 5.0 Ratio 06/13/2006   VLDL 36 06/13/2006   LDLCALC 105 (H) 06/13/2006    Physical Findings: AIMS:  , ,  ,  ,    CIWA:    COWS:     Musculoskeletal: Strength & Muscle Tone: within normal limits Gait & Station: normal Patient leans: N/A  Psychiatric Specialty Exam: Physical Exam  Nursing note and vitals reviewed. Constitutional: He appears well-developed and well-nourished.  HENT:  Head: Normocephalic and atraumatic.  Eyes: Pupils are equal, round, and reactive to light. Conjunctivae are normal.  Neck: Normal range of motion.  Cardiovascular: Regular rhythm and normal heart sounds.  Respiratory: Effort normal.  GI: Soft.  Musculoskeletal: Normal range of motion.  Neurological: He is alert.  Skin: Skin is warm and dry.  Psychiatric: He has a normal mood and affect. His behavior is normal. Judgment and thought content normal.    Review of Systems  Constitutional: Negative.   HENT: Negative.   Eyes: Negative.   Respiratory: Negative.   Cardiovascular: Negative.   Gastrointestinal: Negative.   Musculoskeletal: Negative.   Skin: Negative.   Neurological: Negative.   Psychiatric/Behavioral: Negative.     Blood pressure (!) 141/91, pulse 73, temperature 97.8 F (36.6 C), temperature source Oral, resp. rate 18, height  (1.753 m),  weight 97.5 kg, SpO2 95 %.Body mass index is 31.75 kg/m.  General Appearance: Casual  Eye Contact:  Good  Speech:  Normal Rate  Volume:  Normal  Mood:  Euthymic  Affect:  Congruent  Thought Process:  Goal Directed  Orientation:  Full (Time, Place, and Person)  Thought Content:  Logical  Suicidal Thoughts:  No  Homicidal Thoughts:  No  Memory:  Immediate;   Fair Recent;   Fair Remote;   Fair  Judgement:  Fair  Insight:  Fair  Psychomotor Activity:  Normal  Concentration:  Concentration: Fair  Recall:  [employer] of Knowledge:  Fair  Language:  Fair  Akathisia:  No  Handed:  Right  AIMS (if indicated):     Assets:  Desire for Improvement Housing Resilience  ADL's:  Intact  Cognition:  Impaired,  Mild  Sleep:  Number of Hours: 6.5     Treatment Plan Summary: Daily contact with patient to assess and evaluate symptoms and progress in treatment, Medication management and Plan No change to medication management.  Preparations will be made for discharge tomorrow with the preparation of prescriptions.  Reviewed discharge plan with the patient with the need for him to follow-up with outpatient psychiatric treatment.  Patient agreeable.  Mordecai Rasmussen, MD 04/04/2018, 3:54 PM

## 2018-04-04 NOTE — Progress Notes (Signed)
Recreation Therapy Notes  Date: 04/04/2018  Time: 9:30 am  Location: Craft Room  Behavioral response: Appropriate  Intervention Topic: Happiness  Discussion/Intervention:  Group content today was focused on Happiness. The group defined happiness and described where happiness comes from. Individuals identified what makes them happy and how they go about making others happy. Patients expressed things that stop them from being happy and ways they can improve their happiness. The group stated reasons why it is important to be happy. The group participated in the intervention "My Happiness", where they had a chance to identify and express things that make them happy. Clinical Observations/Feedback:  Patient came to group and was focused on what peers and staff had to say about happiness. He participated in the activity but left group early. Wesley Beck LRT/CTRS            Wesley Beck 04/04/2018 11:43 AM

## 2018-04-04 NOTE — Plan of Care (Signed)
Patient is appropriate in the unit.Rated his anxiety 2/10.Denies SI,HI and AVH.Attended groups.Compliant with medications.Appetite and energy level good.No issues verbalized.Support and encouragement given.

## 2018-04-04 NOTE — Progress Notes (Signed)
Pt visible in the milieu, attends evening groups. Pt remains calm and socializes. Denies SI/ HI and AVH. Pt remains medication compliant. Rash on body noted. Q15 minute safety checks maintained.

## 2018-04-05 NOTE — Progress Notes (Signed)
Patient denies SI/HI, denies A/V hallucinations. Patient verbalizes understanding of discharge instructions, follow up care and prescriptions. Patient given all belongings from [facility] locker. Patient escorted out by staff, transported by cab. 

## 2018-04-05 NOTE — Progress Notes (Signed)
  [facility] Adult Case Management Discharge Plan :  Will you be returning to the same living situation after discharge:  Yes,  home At discharge, do you have transportation home?: Yes,  taxi Do you have the ability to pay for your medications: Yes,  Medicare  Release of information consent forms completed and in the chart;    Patient to Follow up at: Follow-up Information    [facility] Follow up on 04/10/2018.   Why:  You have an appointment scheduled for Wednesday 04/10/2018 at 10:00AM. Thank you! Contact information: [address] [place] [address] [phone]           Next level of care provider has access to [facility] Link:no  Safety Planning and Suicide Prevention discussed: Yes,  SPE completed with pt as pt declined collateral contact  Have you used any form of tobacco in the last 30 days? (Cigarettes, Smokeless Tobacco, Cigars, and/or Pipes): Yes  Has patient been referred to the Quitline?: Patient refused referral  Patient has been referred for addiction treatment: N/A  Mechele Dawley, LCSW 04/05/2018, 9:46 AM

## 2018-04-05 NOTE — [facility] Suicide Risk Assessment (Signed)
[facility] Discharge Suicide Risk Assessment   Principal Problem: Schizoaffective disorder [facility]) Discharge Diagnoses: Principal Problem:   Schizoaffective disorder (HCC) Active Problems:   Asthma   Suicidal behavior with attempted self-injury (HCC)   Cannabis abuse   Total Time spent with patient: 45 minutes  Musculoskeletal: Strength & Muscle Tone: within normal limits Gait & Station: normal Patient leans: N/A  Psychiatric Specialty Exam: Review of Systems  Constitutional: Negative.   HENT: Negative.   Eyes: Negative.   Respiratory: Negative.   Cardiovascular: Negative.   Gastrointestinal: Negative.   Musculoskeletal: Negative.   Skin: Negative.   Neurological: Negative.   Psychiatric/Behavioral: Negative.     Blood pressure 118/80, pulse 70, temperature 97.7 F (36.5 C), temperature source Oral, resp. rate 18, height 5\' 9"  (1.753 m), weight 97.5 kg, SpO2 95 %.Body mass index is 31.75 kg/m.  General Appearance: Casual  Eye Contact::  Good  Speech:  Clear and Coherent409  Volume:  Normal  Mood:  Euthymic  Affect:  Congruent  Thought Process:  Goal Directed  Orientation:  Full (Time, Place, and Person)  Thought Content:  Logical  Suicidal Thoughts:  No  Homicidal Thoughts:  No  Memory:  Immediate;   Fair Recent;   Fair Remote;   Fair  Judgement:  Fair  Insight:  Fair  Psychomotor Activity:  Normal  Concentration:  Fair  Recall:  [employer] of Knowledge:Fair  Language: Fair  Akathisia:  No  Handed:  Right  AIMS (if indicated):     Assets:  Desire for Improvement Housing Physical Health  Sleep:  Number of Hours: 7  Cognition: WNL  ADL's:  Intact   Mental Status Per Nursing Assessment::   On Admission:  NA(Denies)  Demographic Factors:  Male  Loss Factors: NA  Historical Factors: Impulsivity  Risk Reduction Factors:   Sense of responsibility to family, Religious beliefs about death, Living with another person, especially a relative, Positive social  support and Positive therapeutic relationship  Continued Clinical Symptoms:  Schizophrenia:   Depressive state  Cognitive Features That Contribute To Risk:  Loss of executive function    Suicide Risk:  Minimal: No identifiable suicidal ideation.  Patients presenting with no risk factors but with morbid ruminations; may be classified as minimal risk based on the severity of the depressive symptoms  Follow-up Information    [facility] Follow up on 04/10/2018.   Why:  You have an appointment scheduled for Wednesday 04/10/2018 at 10:00AM. Thank you! Contact information: [address] [place] [address] [phone]           Plan Of Care/Follow-up recommendations:  Activity:  Activity as tolerated Diet:  Regular diet Other:  Follow-up with local mental health as previously engaged  Mordecai Rasmussen, MD 04/05/2018, 9:15 AM

## 2018-04-05 NOTE — Progress Notes (Signed)
Patient alert and oriented x 4, denies SI/HI/AVH, affect is flat and sad, he appears anxious and restless noted pacing the unit earlier during the shift, he was approached and encouraged to use some coping skills and also medicated with some PRN medication. Patient is Clinical cytogeneticist, noted interacting appropriately with peers. Patient was offered emotional support and encouraged to attend evening wrap up group. .  Patient attends evening wrap up group and was appropriate no distress noted, 15 minutes safety checks maintained will continue to monitor.

## 2018-04-05 NOTE — Plan of Care (Signed)
  Problem: Coping: Goal: Ability to verbalize frustrations and anger appropriately will improve Outcome: Progressing  Patient is able to verbalize all anger and frustration to staff.

## 2018-04-06 NOTE — Discharge Summary (Signed)
Physician Discharge Summary Note  Patient:  Wesley Beck is an 48 y.o., male MRN:  [id number] DOB:  [birth year] Patient phone:  [phone] (home)  Patient address:   [address] [place] [address],  Total Time spent with patient: 45 minutes  Date of Admission:  04/02/2018 Date of Discharge: April 05, 2018  Reason for Admission: Patient admitted because he was brought to an emergency room after becoming agitated and an argument at home and cutting himself on the wrist.  Principal Problem: Schizoaffective disorder [facility]) Discharge Diagnoses: Principal Problem:   Schizoaffective disorder (HCC) Active Problems:   Asthma   Suicidal behavior with attempted self-injury (HCC)   Cannabis abuse   Past Psychiatric History: Patient has a history of schizoaffective disorder with a history of impulsivity and anger in the past.  Previously had had many hospitalizations but had been fairly stable recently on Zyprexa.  Possibly recent noncompliant  Past Medical History:  Past Medical History:  Diagnosis Date  . Arthritis   . Asthma   . Bipolar 1 disorder (HCC)   . Chronic back pain   . Depression   . Schizophrenia, paranoid type (HCC)   . Seasonal allergies    History reviewed. No pertinent surgical history. Family History: History reviewed. No pertinent family history. Family Psychiatric  History: See previous Social History:  Social History   Substance and Sexual Activity  Alcohol Use No     Social History   Substance and Sexual Activity  Drug Use Yes  . Types: Cocaine, Marijuana   Comment: Says he has not used lately  10/19/13    Social History   Socioeconomic History  . Marital status: Single    Spouse name: Not on file  . Number of children: Not on file  . Years of education: Not on file  . Highest education level: Not on file  Occupational History  . Not on file  Social Needs  . Financial resource strain: Not on file  . Food insecurity:    Worry: Not on  file    Inability: Not on file  . Transportation needs:    Medical: Not on file    Non-medical: Not on file  Tobacco Use  . Smoking status: Current Every Day Smoker    Packs/day: 0.50    Years: 25.00    Pack years: 12.50    Types: Cigarettes  . Smokeless tobacco: Never Used  Substance and Sexual Activity  . Alcohol use: No  . Drug use: Yes    Types: Cocaine, Marijuana    Comment: Says he has not used lately  10/19/13  . Sexual activity: Not on file  Lifestyle  . Physical activity:    Days per week: Not on file    Minutes per session: Not on file  . Stress: Not on file  Relationships  . Social connections:    Talks on phone: Not on file    Gets together: Not on file    Attends religious service: Not on file    Active member of club or organization: Not on file    Attends meetings of clubs or organizations: Not on file    Relationship status: Not on file  Other Topics Concern  . Not on file  Social History Narrative  . Not on file    Hospital Course: Patient admitted to the psychiatric ward.  First thing I noticed was that he was wheezing badly and we made sure he got his albuterol inhaler.  Continues on  modest dose Zyprexa at night.  Also started him on Singulair for what seems like really out of control asthma.  Patient did not display any dangerous or suicidal behavior in the hospital.  He was compliant with medicine.  He was calm and completely denied any wish to hurt himself.  Participated in groups and activities well and attended appropriately to his ADLs.  Patient was discharged back to continue staying at home with his mother and follow-up with his outpatient providers as he had been  Physical Findings: AIMS:  , ,  ,  ,    CIWA:    COWS:     Musculoskeletal: Strength & Muscle Tone: within normal limits Gait & Station: normal Patient leans: N/A  Psychiatric Specialty Exam: Physical Exam  Nursing note and vitals reviewed. Constitutional: He appears  well-developed and well-nourished.  HENT:  Head: Normocephalic and atraumatic.  Eyes: Pupils are equal, round, and reactive to light. Conjunctivae are normal.  Neck: Normal range of motion.  Cardiovascular: Regular rhythm and normal heart sounds.  Respiratory: Effort normal.  GI: Soft.  Musculoskeletal: Normal range of motion.  Neurological: He is alert.  Skin: Skin is warm and dry.  Psychiatric: He has a normal mood and affect. His behavior is normal. Judgment and thought content normal.    Review of Systems  Constitutional: Negative.   HENT: Negative.   Eyes: Negative.   Respiratory: Negative.   Cardiovascular: Negative.   Gastrointestinal: Negative.   Musculoskeletal: Negative.   Skin: Negative.   Neurological: Negative.   Psychiatric/Behavioral: Negative.     Blood pressure 118/80, pulse 70, temperature 97.7 F (36.5 C), temperature source Oral, resp. rate 18, height  (1.753 m), weight 97.5 kg, SpO2 95 %.Body mass index is 31.75 kg/m.  General Appearance: Casual  Eye Contact:  Good  Speech:  Clear and Coherent  Volume:  Increased  Mood:  Euthymic  Affect:  Congruent  Thought Process:  Goal Directed  Orientation:  Full (Time, Place, and Person)  Thought Content:  Logical  Suicidal Thoughts:  No  Homicidal Thoughts:  No  Memory:  Immediate;   Fair Recent;   Fair Remote;   Fair  Judgement:  Fair  Insight:  Fair  Psychomotor Activity:  Decreased  Concentration:  Concentration: Fair  Recall:  Fair  Fund of Knowledge:  Fair  Language:  Fair  Akathisia:  No  Handed:  Right  AIMS (if indicated):     Assets:  Desire for Improvement Housing  ADL's:  Intact  Cognition:  WNL  Sleep:  Number of Hours: 7     Have you used any form of tobacco in the last 30 days? (Cigarettes, Smokeless Tobacco, Cigars, and/or Pipes): Yes  Has this patient used any form of tobacco in the last 30 days? (Cigarettes, Smokeless Tobacco, Cigars, and/or Pipes) Yes, No  Blood Alcohol  level:  Lab Results  Component Value Date   [facility] <11 08/01/2013   ETH <11 05/26/2013    Metabolic Disorder Labs:  No results found for: HGBA1C, MPG No results found for: PROLACTIN Lab Results  Component Value Date   CHOL 176 06/13/2006   TRIG 182 (H) 06/13/2006   HDL 35 (L) 06/13/2006   CHOLHDL 5.0 Ratio 06/13/2006   VLDL 36 06/13/2006   LDLCALC 105 (H) 06/13/2006    See Psychiatric Specialty Exam and Suicide Risk Assessment completed by Attending Physician prior to discharge.  Discharge destination:  Home  Is patient on multiple antipsychotic therapies at discharge:  No   Has Patient had three or more failed trials of antipsychotic monotherapy by history:  No  Recommended Plan for Multiple Antipsychotic Therapies: NA  Discharge Instructions    Diet - low sodium heart healthy   Complete by:  As directed    Increase activity slowly   Complete by:  As directed      Allergies as of 04/05/2018   No Known Allergies     Medication List    STOP taking these medications   ARIPiprazole 20 MG tablet Commonly known as:  ABILIFY   cyclobenzaprine 10 MG tablet Commonly known as:  FLEXERIL   ibuprofen 200 MG tablet Commonly known as:  ADVIL,MOTRIN   ibuprofen 600 MG tablet Commonly known as:  ADVIL,MOTRIN   oxyCODONE-acetaminophen 5-325 MG tablet Commonly known as:  PERCOCET/ROXICET   thiothixene 10 MG capsule Commonly known as:  NAVANE   traMADol 50 MG tablet Commonly known as:  ULTRAM     TAKE these medications     Indication  albuterol 108 (90 Base) MCG/ACT inhaler Commonly known as:  PROVENTIL HFA;VENTOLIN HFA Inhale 1-2 puffs into the lungs every 6 (six) hours as needed for wheezing or shortness of breath. What changed:  how much to take  Indication:  Asthma   loratadine 10 MG tablet Commonly known as:  CLARITIN Take 1 tablet (10 mg total) by mouth daily.  Indication:  Upper Respiratory Tract Allergy   montelukast 10 MG tablet Commonly known as:   SINGULAIR Take 1 tablet (10 mg total) by mouth at bedtime.  Indication:  Asthma   OLANZapine 10 MG tablet Commonly known as:  ZYPREXA Take 1 tablet (10 mg total) by mouth at bedtime.  Indication:  Schizophrenia   traZODone 50 MG tablet Commonly known as:  DESYREL Take 1 tablet (50 mg total) by mouth at bedtime as needed for sleep.  Indication:  Trouble Sleeping      Follow-up Information    [facility] Follow up on 04/10/2018.   Why:  You have an appointment scheduled for Wednesday 04/10/2018 at 10:00AM. Thank you! Contact information: [address] [place] [address] [phone]           Follow-up recommendations:  Activity:  Activity as tolerated Diet:  Regular diet Other:  Follow-up with outpatient medical and psychiatric care  Comments: Patient was agreeable to treatment plan and will continue to follow-up with his outpatient providers in [place]  Signed: Mordecai Rasmussen, MD 04/06/2018, 5:34 PM

## 2018-07-02 ENCOUNTER — Emergency Department (HOSPITAL_COMMUNITY)
Admission: EM | Admit: 2018-07-02 | Discharge: 2018-07-02 | Disposition: A | Discharge status: Home / Self Care | Attending: Emergency Medicine | Admitting: Emergency Medicine

## 2018-07-02 ENCOUNTER — Other Ambulatory Visit: Payer: Self-pay

## 2018-07-02 ENCOUNTER — Encounter (HOSPITAL_COMMUNITY): Payer: Self-pay | Admitting: *Deleted

## 2018-07-02 DIAGNOSIS — L02416 Cutaneous abscess of left lower limb: Secondary | ICD-10-CM | POA: Diagnosis present

## 2018-07-02 DIAGNOSIS — F17210 Nicotine dependence, cigarettes, uncomplicated: Secondary | ICD-10-CM | POA: Insufficient documentation

## 2018-07-02 MED ORDER — LIDOCAINE HCL (PF) 2 % IJ SOLN
5.0000 mL | Freq: Once | INTRAMUSCULAR | Status: AC
  Administered 2018-07-02: 5 mL via INTRADERMAL

## 2018-07-02 MED ORDER — LIDOCAINE HCL (PF) 2 % IJ SOLN
INTRAMUSCULAR | Status: AC
  Filled 2018-07-02: qty 10

## 2018-07-02 MED ORDER — HYDROCODONE-ACETAMINOPHEN 5-325 MG PO TABS: 1.0000 | ORAL_TABLET | ORAL | 0 refills | Status: DC | PRN

## 2018-07-02 MED ORDER — POVIDONE-IODINE 10 % EX SOLN
CUTANEOUS | Status: AC
  Filled 2018-07-02: qty 15

## 2018-07-02 MED ORDER — SULFAMETHOXAZOLE-TRIMETHOPRIM 800-160 MG PO TABS
1.0000 | ORAL_TABLET | Freq: Once | ORAL | Status: AC
  Administered 2018-07-02: 21:00:00 1 via ORAL
  Filled 2018-07-02: qty 1

## 2018-07-02 MED ORDER — HYDROCODONE-ACETAMINOPHEN 5-325 MG PO TABS
1.0000 | ORAL_TABLET | Freq: Once | ORAL | Status: AC
  Administered 2018-07-02: 1 via ORAL
  Filled 2018-07-02: qty 1

## 2018-07-02 NOTE — ED Triage Notes (Signed)
Pt c/o boils to left calf and lateral aspect of right leg that came open while in the shower the other day. Pt reports they are very painful.

## 2018-07-02 NOTE — Discharge Instructions (Addendum)
Keep the area clean and bandaged when possible.  Warm water soaks or compresses on/off 2-3 times per day.  The packing will need to be removed in 2 days (Thursday)  Continue taking your antibiotics as directed.  Return here for any worsening symptoms.

## 2018-07-02 NOTE — ED Provider Notes (Signed)
[facility] EMERGENCY DEPARTMENT Provider Note   CSN: [id number] Arrival date & time: 07/02/18  1808    History   Chief Complaint Chief Complaint  Patient presents with  . Recurrent Skin Infections    HPI Wesley MarryWilliam J Halter is a 48 y.o. male.     HPI   Wesley Beck is a 48 y.o. 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  Symptoms have been present for several days.  He is currently taking Cipro and Bactrim prescribed by the provider at the facility.  He describes pain to his left lower leg that is associated with weight bearing and palpation at the site.  He states that since taking the antibiotics, the abscess on his left leg has begun to drain a bloody to yellow fluid.  He states that he has had boils previously, but they usually spontaneously resolve.  He denies fever, chills, swelling.       Past Medical History:  Diagnosis Date  . Arthritis   . Asthma   . Bipolar 1 disorder (HCC)   . Chronic back pain   . Depression   . Schizophrenia, paranoid type (HCC)   . Seasonal allergies     Patient Active Problem List   Diagnosis Date Noted  . Schizoaffective disorder (HCC) 04/03/2018  . Cannabis abuse 04/03/2018  . Suicidal behavior with attempted self-injury (HCC) 04/02/2018  . Bipolar 1 disorder, mixed (HCC) 02/28/2013  . DISORDER, BIPOLAR NOS 06/05/2006  . TOBACCO ABUSE 06/05/2006  . ALLERGIC RHINITIS 06/05/2006  . Asthma 06/05/2006  . SEBACEOUS CYST 06/05/2006  . LOW BACK PAIN, CHRONIC 06/05/2006  . MALAISE AND FATIGUE 06/05/2006    History reviewed. No pertinent surgical history.      Home Medications    Prior to Admission medications   Medication Sig Start Date End Date Taking? Authorizing Provider  albuterol (PROVENTIL HFA;VENTOLIN HFA) 108 (90 Base) MCG/ACT inhaler Inhale 1-2 puffs into the lungs every 6 (six) hours as needed for wheezing or  shortness of breath. 04/04/18   Clapacs, Jackquline DenmarkJohn T, MD  HYDROcodone-acetaminophen (NORCO/VICODIN) 5-325 MG tablet Take 1 tablet by mouth every 4 (four) hours as needed. 07/02/18   Neville Walston, PA-C  loratadine (CLARITIN) 10 MG tablet Take 1 tablet (10 mg total) by mouth daily. 04/05/18   Clapacs, Jackquline DenmarkJohn T, MD  montelukast (SINGULAIR) 10 MG tablet Take 1 tablet (10 mg total) by mouth at bedtime. 04/04/18   Clapacs, Jackquline DenmarkJohn T, MD  OLANZapine (ZYPREXA) 10 MG tablet Take 1 tablet (10 mg total) by mouth at bedtime. 04/04/18   Clapacs, Jackquline DenmarkJohn T, MD  traZODone (DESYREL) 50 MG tablet Take 1 tablet (50 mg total) by mouth at bedtime as needed for sleep. 04/04/18   Clapacs, Jackquline DenmarkJohn T, MD    Family History No family history on file.  Social History Social History   Tobacco Use  . Smoking status: Current Every Day Smoker    Packs/day: 0.50    Years: 25.00    Pack years: 12.50    Types: Cigarettes  . Smokeless tobacco: Never Used  Substance Use Topics  . Alcohol use: No  . Drug use: Not Currently    Types: Cocaine, Marijuana    Comment: Says he has not used lately  10/19/13     Allergies   Patient has no known allergies.   Review of Systems Review of Systems  Constitutional: Negative for appetite change,  chills and fever.  Respiratory: Negative for shortness of breath.   Cardiovascular: Negative for chest pain.  Gastrointestinal: Negative for abdominal pain, nausea and vomiting.  Musculoskeletal: Negative for arthralgias and joint swelling.  Skin: Negative for color change.       Abscess to both lower legs.  Neurological: Negative for weakness and numbness.  Hematological: Negative for adenopathy.     Physical Exam Updated Vital Signs BP 131/88   Pulse 65   Temp 98.5 F (36.9 C) (Oral)   Resp 17   Ht 5\' 9"  (1.753 m)   Wt 99.8 kg   SpO2 100%   BMI 32.49 kg/m   Physical Exam Vitals signs and nursing note reviewed.  Constitutional:      General: He is not in acute distress.     Appearance: Normal appearance.  Cardiovascular:     Rate and Rhythm: Normal rate and regular rhythm.     Pulses: Normal pulses.  Pulmonary:     Effort: Pulmonary effort is normal.     Breath sounds: Normal breath sounds.  Musculoskeletal: Normal range of motion.  Skin:    General: Skin is warm.     Capillary Refill: Capillary refill takes less than 2 seconds.     Comments: 3 cm focal area of fluctuance and mild bloody to purulent drainage to the medial aspect of left lower leg.  No significant surrounding erythema.  Smaller, scabbed lesion to the lateral right lower leg that appears to be healing.  No induration or fluctuance.    Neurological:     General: No focal deficit present.     Mental Status: He is alert.     Sensory: No sensory deficit.     Motor: No weakness.      ED Treatments / Results  Labs (all labs ordered are listed, but only abnormal results are displayed) Labs Reviewed  AEROBIC/ANAEROBIC CULTURE (SURGICAL/DEEP WOUND)    EKG None  Radiology No results found.  Procedures .Marland KitchenIncision and Drainage Date/Time: 07/02/2018 8:15 PM Performed by: Pauline Aus, PA-C Authorized by: Pauline Aus, PA-C   Consent:    Consent obtained:  Verbal   Consent given by:  Patient   Risks discussed:  Bleeding, incomplete drainage, pain and damage to other organs   Alternatives discussed:  No treatment Universal protocol:    Procedure explained and questions answered to patient or proxy's satisfaction: yes     Site/side marked: yes     Immediately prior to procedure a time out was called: yes     Patient identity confirmed:  Verbally with patient Location:    Type:  Abscess   Size:  3 cm   Location:  Lower extremity   Lower extremity location:  Leg   Leg location:  L lower leg Pre-procedure details:    Skin preparation:  Betadine Anesthesia (see MAR for exact dosages):    Anesthesia method:  Local infiltration   Local anesthetic:  Lidocaine 2% w/o epi  Procedure type:    Complexity:  Complex Procedure details:    Needle aspiration: no     Incision types:  Single straight   Incision depth:  Subcutaneous   Scalpel blade:  11   Wound management:  Probed and deloculated, irrigated with saline and extensive cleaning   Drainage:  Purulent and bloody   Drainage amount:  Moderate   Wound treatment:  Wound left open   Packing materials:  1/4 in gauze Post-procedure details:    Patient tolerance of procedure:  Tolerated  well, no immediate complications   (including critical care time)    Medications Ordered in ED Medications  lidocaine (XYLOCAINE) 2 % injection (has no administration in time range)  povidone-iodine (BETADINE) 10 % external solution (has no administration in time range)  lidocaine (XYLOCAINE) 2 % injection 5 mL (5 mLs Intradermal Given 07/02/18 2015)  sulfamethoxazole-trimethoprim (BACTRIM DS) 800-160 MG per tablet 1 tablet (1 tablet Oral Given 07/02/18 2050)  HYDROcodone-acetaminophen (NORCO/VICODIN) 5-325 MG per tablet 1 tablet (1 tablet Oral Given 07/02/18 2050)     Initial Impression / Assessment and Plan / ED Course  I have reviewed the triage vital signs and the nursing notes.  Pertinent labs & imaging results that were available during my care of the patient were reviewed by me and considered in my medical decision making (see chart for details).       Pt with abscess to the left lower leg.  Currently taking cipro and bactrim.  He is well appearing and non-toxic.  I&D performed.  Pt agrees to continue abx, warm soaks.  Packing to be removed by facility provider.  Return precautions discussed.   Final Clinical Impressions(s) / ED Diagnoses   Final diagnoses:  Abscess of left lower leg    ED Discharge Orders         Ordered    HYDROcodone-acetaminophen (NORCO/VICODIN) 5-325 MG tablet  Every 4 hours PRN     07/02/18 2103           Pauline Aus, PA-C 07/05/18 0007    Long, Arlyss Repress, MD 07/05/18  1649

## 2018-07-03 MED FILL — Hydrocodone-Acetaminophen Tab 5-325 MG: ORAL | Qty: 6 | Status: AC

## 2018-07-05 LAB — AEROBIC CULTURE W GRAM STAIN (SUPERFICIAL SPECIMEN)

## 2018-07-06 ENCOUNTER — Telehealth: Payer: Self-pay | Admitting: Emergency Medicine

## 2018-07-06 NOTE — Telephone Encounter (Signed)
Post ED Visit - Positive Culture Follow-up  Culture report reviewed by antimicrobial stewardship pharmacist: [facility] []  Enzo Bi, Pharm.D. []  Celedonio Miyamoto, Pharm.D., BCPS AQ-ID []  Garvin Fila, Pharm.D., BCPS []  Georgina Pillion, Pharm.D., BCPS []  [place], [address].D., BCPS, AAHIVP []  Estella Husk, Pharm.D., BCPS, AAHIVP [x]  Lysle Pearl, PharmD, BCPS []  Phillips Climes, PharmD, BCPS []  Agapito Games, PharmD, BCPS []  Verlan Friends, PharmD []  Mervyn Gay, PharmD, BCPS []  Vinnie Level, PharmD  [facility] []  Len Childs, PharmD []  Greer Pickerel, PharmD []  Adalberto Cole, PharmD []  Perlie Gold, Rph []  Lonell Face) Jean Rosenthal, PharmD []  Earl Many, PharmD []  Junita Push, PharmD []  Dorna Leitz, PharmD []  Terrilee Files, PharmD []  Lynann Beaver, PharmD []  Keturah Barre, PharmD []  Loralee Pacas, PharmD []  Bernadene Person, PharmD   Positive aerobic culture Treated with Bactrim and Cipro, organism sensitive to the same and no further patient follow-up is required at this time.  Carollee Herter Gerrianne Aydelott 07/06/2018, 4:21 PM

## 2019-02-14 ENCOUNTER — Other Ambulatory Visit: Payer: Self-pay

## 2019-02-14 ENCOUNTER — Emergency Department (HOSPITAL_COMMUNITY)
Admission: EM | Admit: 2019-02-14 | Discharge: 2019-02-14 | Disposition: A | Discharge status: Home / Self Care | Payer: Medicare Other | Attending: Emergency Medicine | Admitting: Emergency Medicine

## 2019-02-14 ENCOUNTER — Encounter (HOSPITAL_COMMUNITY): Payer: Self-pay | Admitting: Emergency Medicine

## 2019-02-14 DIAGNOSIS — F17210 Nicotine dependence, cigarettes, uncomplicated: Secondary | ICD-10-CM | POA: Insufficient documentation

## 2019-02-14 DIAGNOSIS — L02818 Cutaneous abscess of other sites: Secondary | ICD-10-CM | POA: Insufficient documentation

## 2019-02-14 DIAGNOSIS — H60502 Unspecified acute noninfective otitis externa, left ear: Secondary | ICD-10-CM | POA: Diagnosis not present

## 2019-02-14 DIAGNOSIS — J45909 Unspecified asthma, uncomplicated: Secondary | ICD-10-CM | POA: Insufficient documentation

## 2019-02-14 DIAGNOSIS — Z79899 Other long term (current) drug therapy: Secondary | ICD-10-CM | POA: Diagnosis not present

## 2019-02-14 DIAGNOSIS — L089 Local infection of the skin and subcutaneous tissue, unspecified: Secondary | ICD-10-CM | POA: Diagnosis present

## 2019-02-14 DIAGNOSIS — L0291 Cutaneous abscess, unspecified: Secondary | ICD-10-CM

## 2019-02-14 MED ORDER — DOXYCYCLINE HYCLATE 100 MG PO CAPS: 100.0000 mg | ORAL_CAPSULE | Freq: Two times a day (BID) | ORAL | 0 refills | Status: DC

## 2019-02-14 MED ORDER — MUPIROCIN CALCIUM 2 % NA OINT: TOPICAL_OINTMENT | NASAL | 1 refills | Status: DC

## 2019-02-14 MED ORDER — NEOMYCIN-POLYMYXIN-HC 1 % OT SOLN
3.0000 [drp] | Freq: Once | OTIC | Status: AC
  Administered 2019-02-14: 3 [drp] via OTIC
  Filled 2019-02-14: qty 10

## 2019-02-14 NOTE — Discharge Instructions (Signed)
Apply 3-4 drops of the cortisporin to your left ear 4 times a day for 7 days.  Apply over the counter 1% hydrocortisone cream 2 times a day to the itchy areas of both forearms and neck.  Take the antibiotic as directed.  You may contact the dermatologist listed to arrange follow-up appointment if needed.warm water soaks 2-3 times a day to both underarms.

## 2019-02-14 NOTE — ED Provider Notes (Signed)
[facility] EMERGENCY DEPARTMENT Provider Note   CSN: [id number] Arrival date & time: 02/14/19  1509     History Chief Complaint  Patient presents with  . Recurrent Skin Infections    Wesley Beck is a 49 y.o. male.  HPI      Wesley Beck is a 49 y.o. male who presents to the Emergency Department complaining of left ear pain and requesting evaluation of multiple painful "bumps." He states that he was recently released from a correctional facility and during his stay developed reoccurring boils to multiple areas of his body.  He states he has been on multiple antibiotics during the course of his incarceration and the antibiotics provide temporary relief, but the boils return.  He reports having several small painful areas to both axilla and one to the left scalp.  He notes one of the areas to his right axilla began draining bloody appearing fluid last evening.  He also complains of pain to his left ear.  He was given earwax drops while incarcerated, but he states the drops are not helping his symptoms.  He denies dizziness, loss of hearing, fever or chills, nausea or vomiting.    Past Medical History:  Diagnosis Date  . Arthritis   . Asthma   . Bipolar 1 disorder ([place])   . Chronic back pain   . Depression   . Schizophrenia, paranoid type ([place])   . Seasonal allergies     Patient Active Problem List   Diagnosis Date Noted  . Schizoaffective disorder ([place]) 04/03/2018  . Cannabis abuse 04/03/2018  . Suicidal behavior with attempted self-injury ([place]) 04/02/2018  . Bipolar 1 disorder, mixed ([place]) 02/28/2013  . DISORDER, BIPOLAR NOS 06/05/2006  . TOBACCO ABUSE 06/05/2006  . ALLERGIC RHINITIS 06/05/2006  . Asthma 06/05/2006  . SEBACEOUS CYST 06/05/2006  . LOW BACK PAIN, CHRONIC 06/05/2006  . MALAISE AND FATIGUE 06/05/2006    History reviewed. No pertinent surgical history.     Family History  Problem Relation Age of Onset  . COPD Mother   . Hypertension  Father   . Cancer Father   . Cancer Other     Social History   Tobacco Use  . Smoking status: Current Every Day Smoker    Packs/day: 0.50    Years: 25.00    Pack years: 12.50    Types: Cigarettes  . Smokeless tobacco: Never Used  Substance Use Topics  . Alcohol use: No  . Drug use: Not Currently    Types: Cocaine, Marijuana    Comment: Says he has not used lately  10/19/13    Home Medications Prior to Admission medications   Medication Sig Start Date End Date Taking? Authorizing Provider  albuterol (PROVENTIL HFA;VENTOLIN HFA) 108 (90 Base) MCG/ACT inhaler Inhale 1-2 puffs into the lungs every 6 (six) hours as needed for wheezing or shortness of breath. 04/04/18   Clapacs, Madie Reno, MD  HYDROcodone-acetaminophen (NORCO/VICODIN) 5-325 MG tablet Take 1 tablet by mouth every 4 (four) hours as needed. 07/02/18   Misako Roeder, PA-C  loratadine (CLARITIN) 10 MG tablet Take 1 tablet (10 mg total) by mouth daily. 04/05/18   Clapacs, Madie Reno, MD  montelukast (SINGULAIR) 10 MG tablet Take 1 tablet (10 mg total) by mouth at bedtime. 04/04/18   Clapacs, Madie Reno, MD  OLANZapine (ZYPREXA) 10 MG tablet Take 1 tablet (10 mg total) by mouth at bedtime. 04/04/18   Clapacs, Madie Reno, MD  traZODone (DESYREL) 50 MG tablet Take 1 tablet (  50 mg total) by mouth at bedtime as needed for sleep. 04/04/18   Clapacs, Jackquline Denmark, MD    Allergies    Patient has no known allergies.  Review of Systems   Review of Systems  Constitutional: Negative for chills and fever.  HENT: Positive for ear pain. Negative for congestion, facial swelling and hearing loss.   Respiratory: Negative for chest tightness and shortness of breath.   Cardiovascular: Negative for chest pain.  Gastrointestinal: Negative for abdominal pain, nausea and vomiting.  Genitourinary: Negative for difficulty urinating and dysuria.  Musculoskeletal: Negative for arthralgias and joint swelling.  Skin: Negative for color change.       Multiple painful bumps  to both underarms and one to left scalp  Neurological: Negative for dizziness, numbness and headaches.  Hematological: Negative for adenopathy.    Physical Exam Updated Vital Signs BP (!) 126/93 (BP Location: Right Arm)   Pulse 96   Temp 98.8 F (37.1 C) (Oral)   Resp 18   Ht 5\' 9"  (1.753 m)   Wt 90.7 kg   SpO2 98%   BMI 29.53 kg/m   Physical Exam Vitals and nursing note reviewed.  Constitutional:      Appearance: Normal appearance. He is not ill-appearing or toxic-appearing.  HENT:     Right Ear: Tympanic membrane and ear canal normal.     Left Ear: Tympanic membrane normal. No decreased hearing noted. Drainage and tenderness present. No swelling. There is no impacted cerumen. No mastoid tenderness. Tympanic membrane is not perforated.     Ears:     Comments: Mild purulent drainage to the left auditory canal.  No edema, TM poorly visualized, but appears intact    Mouth/Throat:     Mouth: Mucous membranes are moist.  Cardiovascular:     Rate and Rhythm: Regular rhythm.     Pulses: Normal pulses.  Musculoskeletal:        General: Normal range of motion.     Cervical back: Normal range of motion and neck supple. No tenderness.  Lymphadenopathy:     Cervical: No cervical adenopathy.  Skin:    General: Skin is warm.     Comments: Multiple, 1 cm erythematous papules to the bilateral axilla.  One 2 cm area of induration to the right axilla that is draining a small amt of serous fluid.  No fluctuance.  Healing pustule to the left scalp w/o erythema.    Neurological:     General: No focal deficit present.     Mental Status: He is alert.     Sensory: No sensory deficit.     Motor: No weakness.     ED Results / Procedures / Treatments   Labs (all labs ordered are listed, but only abnormal results are displayed) Labs Reviewed - No data to display  EKG None  Radiology No results found.  Procedures Procedures (including critical care time)  Medications Ordered in  ED Medications - No data to display  ED Course  I have reviewed the triage vital signs and the nursing notes.  Pertinent labs & imaging results that were available during my care of the patient were reviewed by me and considered in my medical decision making (see chart for details).    MDM Rules/Calculators/A&P                      Well appearing, non-toxic.  Left OE, has been using Cerumenex drops and multiple small, tender papaules tot he bilateral  axilla.  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  No indication for I&D at present.  Pt agrees to warm compresses, rx given for doxy.  Cortisporin otic drops dispensed.  Pt requesting referral info for dermatology.      Final Clinical Impression(s) / ED Diagnoses Final diagnoses:  Acute otitis externa of left ear, unspecified type  Abscess of multiple sites    Rx / DC Orders ED Discharge Orders    None       Rosey Bath 02/14/19 2009    Jacalyn Lefevre, MD 02/14/19 2029

## 2019-02-14 NOTE — ED Triage Notes (Signed)
Patient reports recurrent skin infections for several months. Patient states he has been given antibiotics on multiple occassions but abscesses keep coming back. Also reports L otalgia.

## 2019-07-01 ENCOUNTER — Encounter (HOSPITAL_COMMUNITY): Payer: Self-pay

## 2019-07-01 ENCOUNTER — Emergency Department (HOSPITAL_COMMUNITY)
Admission: EM | Admit: 2019-07-01 | Discharge: 2019-07-02 | Disposition: A | Discharge status: Home / Self Care | Payer: Medicare Other | Attending: Emergency Medicine | Admitting: Emergency Medicine

## 2019-07-01 ENCOUNTER — Other Ambulatory Visit: Payer: Self-pay

## 2019-07-01 DIAGNOSIS — F17210 Nicotine dependence, cigarettes, uncomplicated: Secondary | ICD-10-CM | POA: Diagnosis not present

## 2019-07-01 DIAGNOSIS — F250 Schizoaffective disorder, bipolar type: Secondary | ICD-10-CM | POA: Insufficient documentation

## 2019-07-01 DIAGNOSIS — Z79899 Other long term (current) drug therapy: Secondary | ICD-10-CM | POA: Insufficient documentation

## 2019-07-01 DIAGNOSIS — J45909 Unspecified asthma, uncomplicated: Secondary | ICD-10-CM | POA: Insufficient documentation

## 2019-07-01 DIAGNOSIS — F209 Schizophrenia, unspecified: Secondary | ICD-10-CM

## 2019-07-01 DIAGNOSIS — F320 Major depressive disorder, single episode, mild: Secondary | ICD-10-CM | POA: Diagnosis present

## 2019-07-01 LAB — ACETAMINOPHEN LEVEL: Acetaminophen (Tylenol), Serum: <10 ug/mL — ABNORMAL LOW (ref 10–30)

## 2019-07-01 LAB — COMPREHENSIVE METABOLIC PANEL
ALT: 25 U/L (ref 0–44)
AST: 32 U/L (ref 15–41)
Albumin: 5.1 g/dL — ABNORMAL HIGH (ref 3.5–5.0)
Alkaline Phosphatase: 106 U/L (ref 38–126)
Anion gap: 13 (ref 5–15)
BUN: 17 mg/dL (ref 6–20)
CO2: 24 mmol/L (ref 22–32)
Calcium: 9.7 mg/dL (ref 8.9–10.3)
Chloride: 104 mmol/L (ref 98–111)
Creatinine, Ser: 1.19 mg/dL (ref 0.61–1.24)
GFR calc Af Amer: >60 mL/min (ref >60)
GFR calc non Af Amer: >60 mL/min (ref >60)
Glucose, Bld: 93 mg/dL (ref 70–99)
Potassium: 4.3 mmol/L (ref 3.5–5.1)
Sodium: 141 mmol/L (ref 135–145)
Total Bilirubin: 1.5 mg/dL — ABNORMAL HIGH (ref 0.3–1.2)
Total Protein: 8 g/dL (ref 6.5–8.1)

## 2019-07-01 LAB — CBC
HCT: 49.6 % (ref 39.0–52.0)
Hemoglobin: 16.4 g/dL (ref 13.0–17.0)
MCH: 31.9 pg (ref 26.0–34.0)
MCHC: 33.1 g/dL (ref 30.0–36.0)
MCV: 96.5 fL (ref 80.0–100.0)
Platelets: 391 10*3/uL (ref 150–400)
RBC: 5.14 MIL/uL (ref 4.22–5.81)
RDW: 13.8 % (ref 11.5–15.5)
WBC: 11.7 10*3/uL — ABNORMAL HIGH (ref 4.0–10.5)
nRBC: 0 % (ref 0.0–0.2)

## 2019-07-01 LAB — ETHANOL: Alcohol, Ethyl (B): <10 mg/dL (ref <10)

## 2019-07-01 LAB — SALICYLATE LEVEL: Salicylate Lvl: <7 mg/dL — ABNORMAL LOW (ref 7.0–30.0)

## 2019-07-01 MED ORDER — ACETAMINOPHEN 325 MG PO TABS: 650.0000 mg | ORAL_TABLET | ORAL | Status: DC | PRN

## 2019-07-01 MED ORDER — ALUM & MAG HYDROXIDE-SIMETH 200-200-20 MG/5ML PO SUSP: 30.0000 mL | Freq: Four times a day (QID) | ORAL | Status: DC | PRN

## 2019-07-01 MED ORDER — OLANZAPINE 5 MG PO TABS
10.0000 mg | ORAL_TABLET | Freq: Every day | ORAL | Status: DC
  Administered 2019-07-01: 10 mg via ORAL
  Filled 2019-07-01: qty 2

## 2019-07-01 MED ORDER — ALBUTEROL SULFATE HFA 108 (90 BASE) MCG/ACT IN AERS: 1.0000 | INHALATION_SPRAY | Freq: Four times a day (QID) | RESPIRATORY_TRACT | Status: DC | PRN

## 2019-07-01 NOTE — ED Provider Notes (Signed)
[facility] EMERGENCY DEPARTMENT Provider Note   CSN: [id number] Arrival date & time: 07/01/19  1859     History Chief Complaint  Patient presents with  . V70.1    Wesley Beck is a 49 y.o. male with a past medical history significant for asthma, bipolar 1 disorder, chronic back pain, depression, and schizophrenia who presents to the ED via [employer] after he walked to the police station. Patient notes he thought he might have a "warrant or two out for his arrest" and was looking for a place to stay. He currently resides in his [facility]. Admits to using marijuana and cocaine, but denies other drugs. Denies alcohol and tobacco use.  Denies SI, HI, and auditory/visual hallucinations.  Patient notes he has been mostly compliant with his medication; however, admits to missing "a few pills every month".  Patient denies any physical complaints at this time.  Patient is here voluntarily.  Chart reviewed.  Patient was last admitted to the behavioral health unit on 04/02/2018 due to schizoaffective disorder.  History obtained from patient and past medical records. No interpreter used during encounter.      Past Medical History:  Diagnosis Date  . Arthritis   . Asthma   . Bipolar 1 disorder ([facility])   . Chronic back pain   . Depression   . Schizophrenia, paranoid type ([facility])   . Seasonal allergies     Patient Active Problem List   Diagnosis Date Noted  . Schizoaffective disorder ([facility]) 04/03/2018  . Cannabis abuse 04/03/2018  . Suicidal behavior with attempted self-injury ([facility]) 04/02/2018  . Bipolar 1 disorder, mixed ([facility]) 02/28/2013  . DISORDER, BIPOLAR NOS 06/05/2006  . TOBACCO ABUSE 06/05/2006  . ALLERGIC RHINITIS 06/05/2006  . Asthma 06/05/2006  . SEBACEOUS CYST 06/05/2006  . LOW BACK PAIN, CHRONIC 06/05/2006  . MALAISE AND FATIGUE 06/05/2006    History reviewed. No pertinent surgical history.     Family History  Problem Relation Age of Onset  . COPD Mother   . Hypertension Father    . Cancer Father   . Cancer Other     Social History   Tobacco Use  . Smoking status: Current Every Day Smoker    Packs/day: 0.50    Years: 25.00    Pack years: 12.50    Types: Cigarettes  . Smokeless tobacco: Never Used  Substance Use Topics  . Alcohol use: No  . Drug use: Not Currently    Types: Cocaine, Marijuana    Comment: last used cocaine this morning    Home Medications Prior to Admission medications   Medication Sig Start Date End Date Taking? Authorizing Provider  albuterol (PROVENTIL HFA;VENTOLIN HFA) 108 (90 Base) MCG/ACT inhaler Inhale 1-2 puffs into the lungs every 6 (six) hours as needed for wheezing or shortness of breath. 04/04/18   Clapacs, Jackquline Denmark, MD  doxycycline (VIBRAMYCIN) 100 MG capsule Take 1 capsule (100 mg total) by mouth 2 (two) times daily. 02/14/19   Triplett, Tammy, PA-C  HYDROcodone-acetaminophen (NORCO/VICODIN) 5-325 MG tablet Take 1 tablet by mouth every 4 (four) hours as needed. 07/02/18   Triplett, Tammy, PA-C  loratadine (CLARITIN) 10 MG tablet Take 1 tablet (10 mg total) by mouth daily. 04/05/18   Clapacs, Jackquline Denmark, MD  montelukast (SINGULAIR) 10 MG tablet Take 1 tablet (10 mg total) by mouth at bedtime. 04/04/18   Clapacs, Jackquline Denmark, MD  mupirocin nasal ointment (BACTROBAN) 2 % Apply to affected areas TID x 10 days 02/14/19   Pauline Aus,  PA-C  OLANZapine (ZYPREXA) 10 MG tablet Take 1 tablet (10 mg total) by mouth at bedtime. 04/04/18   Clapacs, Jackquline Denmark, MD  traZODone (DESYREL) 50 MG tablet Take 1 tablet (50 mg total) by mouth at bedtime as needed for sleep. 04/04/18   Clapacs, Jackquline Denmark, MD    Allergies    Patient has no known allergies.  Review of Systems   Review of Systems  Constitutional: Negative for chills and fever.  HENT: Negative for rhinorrhea and sore throat.   Eyes: Negative for visual disturbance.  Respiratory: Negative for shortness of breath.   Cardiovascular: Negative for chest pain and palpitations.  Gastrointestinal: Negative for  abdominal pain.  Genitourinary: Negative for dysuria.  Musculoskeletal: Negative for myalgias.  Skin: Negative for color change and rash.  Neurological: Negative for dizziness and light-headedness.    Physical Exam Updated Vital Signs BP (!) 148/106 (BP Location: Right Arm)   Pulse (!) 104   Temp 98.3 F (36.8 C) (Oral)   Resp 16   Ht 5\' 9"  (1.753 m)   Wt 99.8 kg   SpO2 93%   BMI 32.49 kg/m   Physical Exam Vitals and nursing note reviewed.  Constitutional:      General: He is not in acute distress.    Appearance: He is not ill-appearing.  HENT:     Head: Normocephalic.  Eyes:     Pupils: Pupils are equal, round, and reactive to light.  Cardiovascular:     Rate and Rhythm: Normal rate and regular rhythm.     Pulses: Normal pulses.     Heart sounds: Normal heart sounds. No murmur. No friction rub. No gallop.   Pulmonary:     Effort: Pulmonary effort is normal.     Breath sounds: Wheezing present.  Abdominal:     General: Abdomen is flat. There is no distension.     Palpations: Abdomen is soft.     Tenderness: There is no abdominal tenderness. There is no guarding or rebound.  Musculoskeletal:     Cervical back: Neck supple.     Comments: Able to move all 4 extremities without difficulty.   Skin:    General: Skin is warm and dry.  Neurological:     General: No focal deficit present.     Mental Status: He is alert.  Psychiatric:        Attention and Perception: He does not perceive auditory or visual hallucinations.        Behavior: Behavior is slowed and withdrawn.        Thought Content: Thought content does not include homicidal or suicidal ideation.     ED Results / Procedures / Treatments   Labs (all labs ordered are listed, but only abnormal results are displayed) Labs Reviewed  COMPREHENSIVE METABOLIC PANEL - Abnormal; Notable for the following components:      Result Value   Albumin 5.1 (*)    Total Bilirubin 1.5 (*)    All other components within  normal limits  SALICYLATE LEVEL - Abnormal; Notable for the following components:   Salicylate Lvl <7.0 (*)    All other components within normal limits  ACETAMINOPHEN LEVEL - Abnormal; Notable for the following components:   Acetaminophen (Tylenol), Serum <10 (*)    All other components within normal limits  CBC - Abnormal; Notable for the following components:   WBC 11.7 (*)    All other components within normal limits  SARS CORONAVIRUS 2 BY RT PCR (HOSPITAL ORDER, PERFORMED  [facility])  ETHANOL  RAPID URINE DRUG SCREEN, HOSP PERFORMED    EKG None  Radiology No results found.  Procedures Procedures (including critical care time)  Medications Ordered in ED Medications  OLANZapine (ZYPREXA) tablet 10 mg (has no administration in time range)  albuterol (VENTOLIN HFA) 108 (90 Base) MCG/ACT inhaler 1-2 puff (has no administration in time range)  acetaminophen (TYLENOL) tablet 650 mg (has no administration in time range)  alum & mag hydroxide-simeth (MAALOX/MYLANTA) 200-200-20 MG/5ML suspension 30 mL (has no administration in time range)    ED Course  I have reviewed the triage vital signs and the nursing notes.  Pertinent labs & imaging results that were available during my care of the patient were reviewed by me and considered in my medical decision making (see chart for details).    MDM Rules/Calculators/A&P                     49 year old male presents to the ED after walking to the police station inquiring about possible warrants.  Patient denies SI, HI, and visual/auditory hallucinations.  Patient has a history of schizophrenia and bipolar 1 disorder.  Stable vitals.  Patient in no acute distress and nontoxic-appearing.  Physical exam reassuring.  Will obtain medical clearance labs.  Mild leukocytosis at 11.7, but otherwise reassuring.  CMP reassuring with normal renal function no major electrolyte derangements.  Ethanol, salicylate, acetaminophen levels  within normal limit.  Patient has been medically cleared for TTS evaluation.  Patient placed in psych hold pending TTS evaluation. Home medications ordered.   The patient has been placed in psychiatric observation due to the need to provide a safe environment for the patient while obtaining psychiatric consultation and evaluation, as well as ongoing medical and medication management to treat the patient's condition.  The patient has not been placed under full IVC at this time. Final Clinical Impression(s) / ED Diagnoses Final diagnoses:  Schizophrenia, unspecified type [facility])    Rx / [place] Orders ED Discharge Orders    None       Karie Kirks 07/01/19 2154    Dorie Rank, MD 07/02/19 2018

## 2019-07-01 NOTE — ED Notes (Signed)
Pt wanded by security prior to changing into psych scrubs.  °

## 2019-07-01 NOTE — ED Triage Notes (Signed)
Pt brought to ED by [facility]. Pt states he walked to the [employer] de[place] because he thought might of had a warrant or two and he thought he might "acquire a room at the jail for the night." Pt states he has been on THC, CBD, cocaine. Pt denies SI/HI at this time. Pt states "I might be homeless but I don't really know." Pt states he wants to go to [facility] or [facility] because "I'm no good for nobody." "I haven't been to church in umpteen months, I need to learn how to be good to somebody."

## 2019-07-02 ENCOUNTER — Encounter (HOSPITAL_COMMUNITY): Payer: Self-pay | Admitting: Behavioral Health

## 2019-07-02 NOTE — BH Assessment (Signed)
Tele Assessment Note   Patient Name: Wesley Beck MRN: [id number] Referring Physician: Claudette Stapler, PA-C Location of Patient: [facility] Location of Provider: Behavioral Health TTS Department  Wesley Beck is a 49 y.o. male who presented to [facility] on voluntary basis.  Per hospital notes, Pt has a history of asthma, bipolar 1 disorder, chronic back pain, depression, and schizophrenia.  He presented to the ED via [facility] after he walked to the police station. Patient notes he thought he might have a "warrant or two out for his arrest" and was looking for a place to stay.   Pt appeared disheveled, and he had to be roused for assessment.  When asked why he came into the hospital, Pt stated, ''I think I lost my father's Zenaida Niece.''  Pt denied suicidal ideation, homicidal ideation, hallucination, and self-injurious behavior.  Pt endorsed at least one prior suicide attempt (''about 15 years ago'').  He denied current suicidal ideation, plan, or intent.  Pt endorsed despondency.  Pt also endorsed weekly use of marijuana (varied amounts) and monthly use of crack cocaine ($20 worth each time).  UDS was not available at time of assessment.  Pt has several psychosocial stressors, including homelessness, unemployment, and non-compliance with Zyprexa.  Pt stated that he does not have a psychiatrist.  When asked how we could help, Pt stated that he would like a place to stay.  He asked about recent outbreaks of COVID at the hospital.  During assessment, Pt presented as alert and oriented.  He had fair eye contact and was cooperative.  Pt was disheveled.  Demeanor was calm.  Pt's mood was euthymic and ambivalent.  Affect was calm.  Pt's speech was normal in rate, rhythm, and volume.  Thought content was logical and goal-oriented.  There was no evidence of delusion.  Pt's memory and concentration were fair.  Insight, judgment, and impulse control were fair.  Consulted with Rhona Raider, NP, who determined that Pt does  not meet inpatient criteria.  Recommend referral to [facility].  Diagnosis: Schizoaffective Disorder, Bipolar Type  Past Medical History:  Past Medical History:  Diagnosis Date  . Arthritis   . Asthma   . Bipolar 1 disorder (HCC)   . Chronic back pain   . Depression   . Schizophrenia, paranoid type (HCC)   . Seasonal allergies     History reviewed. No pertinent surgical history.  Family History:  Family History  Problem Relation Age of Onset  . COPD Mother   . Hypertension Father   . Cancer Father   . Cancer Other     Social History:  reports that he has been smoking cigarettes. He has a 12.50 pack-year smoking history. He has never used smokeless tobacco. He reports current drug use. Drugs: Marijuana and "Crack" cocaine. He reports that he does not drink alcohol.  Additional Social History:  Alcohol / Drug Use Pain Medications: See MAR Prescriptions: See MAR Over the Counter: See MAR History of alcohol / drug use?: Yes Substance #1 Name of Substance 1: Crack Cocaine 1 - Amount (size/oz): $20 worth 1 - Frequency: Monthly 1 - Duration: 25 yars 1 - Last Use / Amount: ''A few mornings go'' Substance #2 Name of Substance 2: Marijuana 2 - Amount (size/oz): varied 2 - Frequency: Weekly 2 - Duration: 35 years 2 - Last Use / Amount: ''a few mornings ago''  CIWA: CIWA-Ar BP: 121/63 Pulse Rate: 67 COWS:    Allergies: No Known Allergies  Home Medications: (Not in a  hospital admission)   OB/GYN Status:  No LMP for male patient.  General Assessment Data Location of Assessment: [facility] TTS Assessment: In system Is this a Tele or Face-to-Face Assessment?: Tele Assessment Is this an Initial Assessment or a Re-assessment for this encounter?: Initial Assessment Language Other than English: No Living Arrangements: Homeless/Shelter What gender do you identify as?: Male Marital status: Single Pregnancy Status: No Living Arrangements: Other (Comment)(No fixed  address; may live in a van) Can pt return to current living arrangement?: Yes Admission Status: Voluntary Is patient capable of signing voluntary admission?: Yes Referral Source: Self/Family/Friend Insurance type: [facility] MCR     Crisis Care Plan Living Arrangements: Other (Comment)(No fixed address; may live in a [place]) Name of Psychiatrist: None Name of Therapist: None  Education Status Is patient currently in school?: No Is the patient employed, unemployed or receiving disability?: Unemployed  Risk to self with the past 6 months Suicidal Ideation: No Has patient been a risk to self within the past 6 months prior to admission? : No Suicidal Intent: No Has patient had any suicidal intent within the past 6 months prior to admission? : No Is patient at risk for suicide?: No Suicidal Plan?: No Has patient had any suicidal plan within the past 6 months prior to admission? : No Access to Means: No What has been your use of drugs/alcohol within the last 12 months?: Crack cocaine; marijuana Previous Attempts/Gestures: Yes How many times?: 1(At least) Triggers for Past Attempts: None known Intentional Self Injurious Behavior: None Family Suicide History: Unknown Recent stressful life event(s): Financial Problems, Legal Issues Persecutory voices/beliefs?: No Depression: Yes Depression Symptoms: Despondent, Feeling angry/irritable Substance abuse history and/or treatment for substance abuse?: Yes Suicide prevention information given to non-admitted patients: Not applicable  Risk to Others within the past 6 months Homicidal Ideation: No Does patient have any lifetime risk of violence toward others beyond the six months prior to admission? : No Thoughts of Harm to Others: No Current Homicidal Intent: No Current Homicidal Plan: No Access to Homicidal Means: No History of harm to others?: No Assessment of Violence: None Noted Does patient have access to weapons?: No Criminal Charges  Pending?: Yes Describe Pending Criminal Charges: Failure to wear seatbelt Does patient have a court date: Yes Court Date: 07/17/19 Is patient on probation?: Unknown  Psychosis Hallucinations: None noted(Pt denied; has history of AVH) Delusions: None noted  Mental Status Report Appearance/Hygiene: Disheveled, In scrubs Eye Contact: Fair Motor Activity: Freedom of movement, Unremarkable Speech: Logical/coherent Level of Consciousness: Alert Mood: Ambivalent, Euthymic Affect: Appropriate to circumstance Anxiety Level: None Thought Processes: Coherent, Relevant Judgement: Partial Orientation: Person, Place, Situation, Time Obsessive Compulsive Thoughts/Behaviors: None  Cognitive Functioning Concentration: Normal Memory: Recent Intact, Remote Intact Is patient IDD: No Insight: Fair Impulse Control: Fair Appetite: Fair Have you had any weight changes? : No Change Sleep: No Change  ADLScreening [facility]) Patient's cognitive ability adequate to safely complete daily activities?: Yes Patient able to express need for assistance with ADLs?: Yes Independently performs ADLs?: Yes (appropriate for developmental age)  Prior Inpatient Therapy Prior Inpatient Therapy: Yes Prior Therapy Dates: 2020, 2015 Prior Therapy Facilty/Provider(s): [facility] Reason for Treatment: SI, Schizoaffective  Prior Outpatient Therapy Prior Outpatient Therapy: No Does patient have an ACCT team?: No Does patient have Intensive In-House Services?  : No Does patient have [facility] services? : No Does patient have P4CC services?: No  ADL Screening (condition at time of admission) Patient's cognitive ability adequate to safely complete daily  activities?: Yes Is the patient deaf or have difficulty hearing?: No Does the patient have difficulty seeing, even when wearing glasses/contacts?: No Does the patient have difficulty concentrating, remembering, or making decisions?: No Patient able to  express need for assistance with ADLs?: Yes Does the patient have difficulty dressing or bathing?: No Independently performs ADLs?: Yes (appropriate for developmental age) Does the patient have difficulty walking or climbing stairs?: No Weakness of Legs: None Weakness of Arms/Hands: None  Home Assistive Devices/Equipment Home Assistive Devices/Equipment: None  Therapy Consults (therapy consults require a physician order) PT Evaluation Needed: No OT Evalulation Needed: No SLP Evaluation Needed: No Abuse/Neglect Assessment (Assessment to be complete while patient is alone) Abuse/Neglect Assessment Can Be Completed: Yes Physical Abuse: Denies Verbal Abuse: Yes, past (Comment) Sexual Abuse: Denies Exploitation of patient/patient's resources: Denies Values / Beliefs Cultural Requests During Hospitalization: None Spiritual Requests During Hospitalization: None Consults Spiritual Care Consult Needed: No Transition of Care Team Consult Needed: No Advance Directives (For Healthcare) Does Patient Have a Medical Advance Directive?: No          Disposition:  Disposition Initial Assessment Completed for this Encounter: Yes Disposition of Patient: Discharge  This service was provided via telemedicine using a 2-way, interactive audio and video technology.  Names of all persons participating in this telemedicine service and their role in this encounter. Name: Hali Marry Role: Patient             Earline Mayotte 07/02/2019 8:32 AM

## 2019-07-02 NOTE — ED Notes (Signed)
Called and spoke with Sophira to make available resources for the pt to have when he is discharged due to pt states he is homeless

## 2019-07-02 NOTE — ED Notes (Signed)
TTS in progress 

## 2019-07-02 NOTE — Discharge Instructions (Signed)
Follow up as instructed by behavior health.  Follow up with [facility]

## 2019-07-02 NOTE — Progress Notes (Signed)
CSW in contact with [facility] to seek shelter resources for patient. CSW was informed that patient must call themselves personally and ask for "shelter". . PH: [phone].   Razia Screws Sherryle Lis LCSWA Transitions of Care  Clinical Social Worker  Ph: [phone]

## 2019-07-02 NOTE — Progress Notes (Signed)
CSW received call from [facility] about needing resources for patient in need of homeless resources. CSW in contact with patient to confirm his contact information to use the [employer] platform. Patient explains that he does not have any contact information or family that could assist him.  CSW later in contact with [facility] advocate Mrs. Linna Hoff. Mrs. Linna Hoff explains that she can give patient a call in the ED in an hour to assist him with possible shelter beds in the [place] area.   CSW will continue to follow patient for discharge related needs. Patient scheduled to discharge to [facility] for outpatient treatment later today.   Aniesha Haughn Sherryle Lis LCSWA Transitions of Care  Clinical Social Worker  Ph: [phone]

## 2019-07-02 NOTE — ED Notes (Signed)
Pt states that they provided him with [facility]'s number, but didn't have any other resources for him.

## 2019-07-02 NOTE — ED Notes (Signed)
Reviewed d/c instructions with pt, belongings returned to pt.  Offered paper scrubs, pt refused.

## 2019-07-02 NOTE — ED Notes (Signed)
Called social work for an update on resources, social work states that they need to discuss Internet availabiltiy with pt. social work is talking to pt on phone

## 2019-07-02 NOTE — ED Notes (Signed)
Per social work, attempting resources from [facility], called and phone given to pt.

## 2019-07-17 ENCOUNTER — Other Ambulatory Visit: Payer: Self-pay | Admitting: Psychiatry

## 2020-05-03 ENCOUNTER — Ambulatory Visit (HOSPITAL_COMMUNITY)
Admission: RE | Admit: 2020-05-03 | Discharge: 2020-05-03 | Disposition: A | Discharge status: Home / Self Care | Payer: Medicare Other | Source: Ambulatory Visit | Attending: Family Medicine | Admitting: Family Medicine

## 2020-05-03 ENCOUNTER — Other Ambulatory Visit: Payer: Self-pay

## 2020-05-03 ENCOUNTER — Other Ambulatory Visit (HOSPITAL_COMMUNITY): Payer: Self-pay | Admitting: Family Medicine

## 2020-05-03 ENCOUNTER — Other Ambulatory Visit (HOSPITAL_COMMUNITY)
Admission: RE | Admit: 2020-05-03 | Discharge: 2020-05-03 | Disposition: A | Discharge status: Home / Self Care | Payer: Medicare Other | Source: Ambulatory Visit | Attending: Family Medicine | Admitting: Family Medicine

## 2020-05-03 DIAGNOSIS — E559 Vitamin D deficiency, unspecified: Secondary | ICD-10-CM | POA: Diagnosis present

## 2020-05-03 DIAGNOSIS — M545 Low back pain, unspecified: Secondary | ICD-10-CM | POA: Insufficient documentation

## 2020-05-03 DIAGNOSIS — D510 Vitamin B12 deficiency anemia due to intrinsic factor deficiency: Secondary | ICD-10-CM | POA: Insufficient documentation

## 2020-05-03 DIAGNOSIS — E7849 Other hyperlipidemia: Secondary | ICD-10-CM | POA: Diagnosis present

## 2020-05-03 DIAGNOSIS — R5383 Other fatigue: Secondary | ICD-10-CM | POA: Insufficient documentation

## 2020-05-03 DIAGNOSIS — I110 Hypertensive heart disease with heart failure: Secondary | ICD-10-CM | POA: Diagnosis present

## 2020-05-03 LAB — COMPREHENSIVE METABOLIC PANEL
ALT: 28 U/L (ref 0–44)
AST: 31 U/L (ref 15–41)
Albumin: 4.1 g/dL (ref 3.5–5.0)
Alkaline Phosphatase: 75 U/L (ref 38–126)
Anion gap: 8 (ref 5–15)
BUN: 7 mg/dL (ref 6–20)
CO2: 27 mmol/L (ref 22–32)
Calcium: 9 mg/dL (ref 8.9–10.3)
Chloride: 102 mmol/L (ref 98–111)
Creatinine, Ser: 0.9 mg/dL (ref 0.61–1.24)
GFR, Estimated: >60 mL/min (ref >60)
Glucose, Bld: 83 mg/dL (ref 70–99)
Potassium: 4.3 mmol/L (ref 3.5–5.1)
Sodium: 137 mmol/L (ref 135–145)
Total Bilirubin: 0.7 mg/dL (ref 0.3–1.2)
Total Protein: 6.8 g/dL (ref 6.5–8.1)

## 2020-05-03 LAB — TSH: TSH: 0.887 u[IU]/mL (ref 0.350–4.500)

## 2020-05-03 LAB — CBC
HCT: 47.1 % (ref 39.0–52.0)
Hemoglobin: 15.4 g/dL (ref 13.0–17.0)
MCH: 32.2 pg (ref 26.0–34.0)
MCHC: 32.7 g/dL (ref 30.0–36.0)
MCV: 98.3 fL (ref 80.0–100.0)
Platelets: 303 10*3/uL (ref 150–400)
RBC: 4.79 MIL/uL (ref 4.22–5.81)
RDW: 13.2 % (ref 11.5–15.5)
WBC: 5.9 10*3/uL (ref 4.0–10.5)
nRBC: 0 % (ref 0.0–0.2)

## 2020-05-03 LAB — T4, FREE: Free T4: 0.92 ng/dL (ref 0.61–1.12)

## 2020-05-03 LAB — LIPID PANEL
Cholesterol: 171 mg/dL (ref 0–200)
HDL: 44 mg/dL (ref >40)
LDL Cholesterol: 112 mg/dL — ABNORMAL HIGH (ref 0–99)
Total CHOL/HDL Ratio: 3.9 RATIO
Triglycerides: 75 mg/dL (ref <150)
VLDL: 15 mg/dL (ref 0–40)

## 2020-05-03 LAB — VITAMIN D 25 HYDROXY (VIT D DEFICIENCY, FRACTURES): Vit D, 25-Hydroxy: 56 ng/mL (ref 30–100)

## 2021-03-04 ENCOUNTER — Other Ambulatory Visit: Payer: Self-pay

## 2021-03-04 ENCOUNTER — Emergency Department (HOSPITAL_COMMUNITY)
Admission: EM | Admit: 2021-03-04 | Discharge: 2021-03-05 | Disposition: A | Discharge status: Home / Self Care | Payer: Medicare Other | Attending: Emergency Medicine | Admitting: Emergency Medicine

## 2021-03-04 ENCOUNTER — Encounter (HOSPITAL_COMMUNITY): Payer: Self-pay

## 2021-03-04 DIAGNOSIS — M79672 Pain in left foot: Secondary | ICD-10-CM

## 2021-03-04 DIAGNOSIS — R7309 Other abnormal glucose: Secondary | ICD-10-CM | POA: Diagnosis not present

## 2021-03-04 DIAGNOSIS — M5432 Sciatica, left side: Secondary | ICD-10-CM | POA: Insufficient documentation

## 2021-03-04 LAB — CBG MONITORING, ED: Glucose-Capillary: 122 mg/dL — ABNORMAL HIGH (ref 70–99)

## 2021-03-04 MED ORDER — METHYLPREDNISOLONE 4 MG PO TBPK: ORAL_TABLET | ORAL | 0 refills | Status: DC

## 2021-03-04 NOTE — ED Provider Notes (Signed)
[facility] EMERGENCY DEPARTMENT Provider Note   CSN: [id number] Arrival date & time: 03/04/21  2309     History  Chief Complaint  Patient presents with   Foot Pain    Wesley Beck is a 51 y.o. male.  Patient presents to the emergency department for evaluation of foot pain.  Patient reports that he started having pain in the left foot earlier tonight.  He denies injury.  He thinks it might be from "popping my toes".  Patient reports pain around the big toe area.  He also reports that he has been having problems with sciatica of the left side for some time.  He does report a prior history of plantar fasciitis in the right foot.      Home Medications Prior to Admission medications   Medication Sig Start Date End Date Taking? Authorizing Provider  albuterol (PROVENTIL HFA;VENTOLIN HFA) 108 (90 Base) MCG/ACT inhaler Inhale 1-2 puffs into the lungs every 6 (six) hours as needed for wheezing or shortness of breath. 04/04/18   Clapacs, Jackquline Denmark, MD  doxycycline (VIBRAMYCIN) 100 MG capsule Take 1 capsule (100 mg total) by mouth 2 (two) times daily. Patient not taking: Reported on 07/02/2019 02/14/19   Pauline Aus, PA-C  HYDROcodone-acetaminophen (NORCO/VICODIN) 5-325 MG tablet Take 1 tablet by mouth every 4 (four) hours as needed. Patient not taking: Reported on 07/02/2019 07/02/18   Triplett, Tammy, PA-C  loratadine (CLARITIN) 10 MG tablet Take 1 tablet (10 mg total) by mouth daily. Patient not taking: Reported on 07/02/2019 04/05/18   Clapacs, Jackquline Denmark, MD  montelukast (SINGULAIR) 10 MG tablet Take 1 tablet (10 mg total) by mouth at bedtime. Patient not taking: Reported on 07/02/2019 04/04/18   Clapacs, Jackquline Denmark, MD  mupirocin nasal ointment (BACTROBAN) 2 % Apply to affected areas TID x 10 days Patient not taking: Reported on 07/02/2019 02/14/19   Triplett, Tammy, PA-C  OLANZapine (ZYPREXA) 10 MG tablet Take 1 tablet (10 mg total) by mouth at bedtime. 04/04/18   Clapacs, Jackquline Denmark, MD   sulfamethoxazole-trimethoprim (BACTRIM DS) 800-160 MG tablet Take 1 tablet by mouth 2 (two) times daily. 06/15/19   [provider]  traZODone (DESYREL) 50 MG tablet Take 1 tablet (50 mg total) by mouth at bedtime as needed for sleep. Patient not taking: Reported on 07/02/2019 04/04/18   Clapacs, Jackquline Denmark, MD      Allergies    Patient has no known allergies.    Review of Systems   Review of Systems  Musculoskeletal:  Positive for arthralgias.   Physical Exam Updated Vital Signs BP (!) 135/94 (BP Location: Left Arm)    Pulse 92    Temp 98 F (36.7 C) (Oral)    Resp (!) 21    Ht 5\' 9"  (1.753 m)    Wt 90.7 kg    SpO2 95%    BMI 29.53 kg/m  Physical Exam Vitals and nursing note reviewed.  Constitutional:      Appearance: Normal appearance.  HENT:     Head: Atraumatic.  Eyes:     Pupils: Pupils are equal, round, and reactive to light.  Cardiovascular:     Rate and Rhythm: Normal rate and regular rhythm.  Pulmonary:     Effort: Pulmonary effort is normal.     Breath sounds: Normal breath sounds.  Musculoskeletal:     Cervical back: Normal range of motion.     Thoracic back: Normal.     Lumbar back: Normal. Negative right straight leg  raise test and negative left straight leg raise test.     Right hip: Normal.     Left hip: Normal.     Right lower leg: Normal.     Left lower leg: Normal.     Right ankle: Normal.     Left ankle: Normal.     Left foot: Normal range of motion and normal capillary refill. Tenderness (First MTP area) present. No swelling, deformity, foot drop, prominent metatarsal heads or laceration.  Skin:    Comments: No erythema or swelling of joints of the left foot  Neurological:     Mental Status: He is alert and oriented to person, place, and time.     Cranial Nerves: Cranial nerves 2-12 are intact.     Sensory: Sensation is intact.     Motor: Motor function is intact.     Comments: Lower extremity strength bilaterally, no foot drop, no saddle  anesthesia     ED Results / Procedures / Treatments   Labs (all labs ordered are listed, but only abnormal results are displayed) Labs Reviewed  CBG MONITORING, ED    EKG None  Radiology No results found.  Procedures Procedures    Medications Ordered in ED Medications - No data to display  ED Course/ Medical Decision Making/ A&P                           Medical Decision Making  Patient presents with complaints of pain in the left big toe area.  There has not been any known injury.  Examination reveals some mild tenderness in the area without overlying skin changes.  No erythema, warmth or signs of infection.  No bruising.  There is no swelling or deformity.  Patient has preserved range of motion of the first MTP but there is some pain with movement.  Distal toe is normal, normal capillary refill, normal nailbed.  Patient complaining of left-sided sciatica.  He has normal strength in both lower extremities, no signs of cauda equina syndrome or red flags.  Normal sensation of both lower extremities, no foot drop or saddle anesthesia.  Presentation consistent with soft tissue pain but might represent early gout.  Treat sciatica and toe pain with prednisone, follow-up with primary care.         Final Clinical Impression(s) / ED Diagnoses Final diagnoses:  Foot pain, left  Sciatica of left side    Rx / DC Orders ED Discharge Orders     None         Cloe Sockwell, Canary Brim, MD 03/04/21 2344

## 2021-03-04 NOTE — ED Triage Notes (Signed)
Pt arrives with c/o left foot pain that started a few hours ago. Pt denies injury.

## 2021-05-20 DIAGNOSIS — Z538 Procedure and treatment not carried out for other reasons: Secondary | ICD-10-CM | POA: Diagnosis not present

## 2021-05-20 DIAGNOSIS — Z79899 Other long term (current) drug therapy: Secondary | ICD-10-CM | POA: Diagnosis not present

## 2021-05-20 DIAGNOSIS — Z5181 Encounter for therapeutic drug level monitoring: Secondary | ICD-10-CM | POA: Diagnosis not present

## 2021-05-20 DIAGNOSIS — F17200 Nicotine dependence, unspecified, uncomplicated: Secondary | ICD-10-CM | POA: Diagnosis not present

## 2021-06-21 DIAGNOSIS — J45901 Unspecified asthma with (acute) exacerbation: Secondary | ICD-10-CM | POA: Diagnosis not present

## 2021-06-21 DIAGNOSIS — Z7951 Long term (current) use of inhaled steroids: Secondary | ICD-10-CM | POA: Diagnosis not present

## 2021-06-21 DIAGNOSIS — R062 Wheezing: Secondary | ICD-10-CM | POA: Diagnosis not present

## 2021-06-21 DIAGNOSIS — Z20822 Contact with and (suspected) exposure to covid-19: Secondary | ICD-10-CM | POA: Diagnosis not present

## 2021-06-21 DIAGNOSIS — J449 Chronic obstructive pulmonary disease, unspecified: Secondary | ICD-10-CM | POA: Diagnosis not present

## 2021-06-21 DIAGNOSIS — J4521 Mild intermittent asthma with (acute) exacerbation: Secondary | ICD-10-CM | POA: Diagnosis not present

## 2021-06-21 DIAGNOSIS — F17210 Nicotine dependence, cigarettes, uncomplicated: Secondary | ICD-10-CM | POA: Diagnosis not present

## 2021-06-21 DIAGNOSIS — R0600 Dyspnea, unspecified: Secondary | ICD-10-CM | POA: Diagnosis not present

## 2021-06-22 DIAGNOSIS — J449 Chronic obstructive pulmonary disease, unspecified: Secondary | ICD-10-CM | POA: Diagnosis not present

## 2021-06-22 DIAGNOSIS — R062 Wheezing: Secondary | ICD-10-CM | POA: Diagnosis not present

## 2021-06-22 DIAGNOSIS — R0600 Dyspnea, unspecified: Secondary | ICD-10-CM | POA: Diagnosis not present

## 2021-10-07 ENCOUNTER — Emergency Department (HOSPITAL_COMMUNITY): Payer: Medicare Other

## 2021-10-07 ENCOUNTER — Encounter (HOSPITAL_COMMUNITY): Payer: Self-pay | Admitting: *Deleted

## 2021-10-07 ENCOUNTER — Encounter (HOSPITAL_COMMUNITY): Payer: Self-pay

## 2021-10-07 ENCOUNTER — Inpatient Hospital Stay (HOSPITAL_COMMUNITY)
Admission: EM | Admit: 2021-10-07 | Discharge: 2021-10-11 | DRG: 208 | Disposition: A | Discharge status: Home / Self Care | Payer: Medicare Other | Attending: Internal Medicine | Admitting: Internal Medicine

## 2021-10-07 DIAGNOSIS — F19129 Other psychoactive substance abuse with intoxication, unspecified: Secondary | ICD-10-CM | POA: Diagnosis present

## 2021-10-07 DIAGNOSIS — J4541 Moderate persistent asthma with (acute) exacerbation: Secondary | ICD-10-CM | POA: Diagnosis not present

## 2021-10-07 DIAGNOSIS — Z79899 Other long term (current) drug therapy: Secondary | ICD-10-CM

## 2021-10-07 DIAGNOSIS — Z825 Family history of asthma and other chronic lower respiratory diseases: Secondary | ICD-10-CM | POA: Diagnosis not present

## 2021-10-07 DIAGNOSIS — J9602 Acute respiratory failure with hypercapnia: Secondary | ICD-10-CM | POA: Diagnosis not present

## 2021-10-07 DIAGNOSIS — Z8249 Family history of ischemic heart disease and other diseases of the circulatory system: Secondary | ICD-10-CM

## 2021-10-07 DIAGNOSIS — F200 Paranoid schizophrenia: Secondary | ICD-10-CM | POA: Diagnosis present

## 2021-10-07 DIAGNOSIS — J4551 Severe persistent asthma with (acute) exacerbation: Principal | ICD-10-CM

## 2021-10-07 DIAGNOSIS — R791 Abnormal coagulation profile: Secondary | ICD-10-CM | POA: Diagnosis not present

## 2021-10-07 DIAGNOSIS — R0902 Hypoxemia: Secondary | ICD-10-CM | POA: Diagnosis not present

## 2021-10-07 DIAGNOSIS — I10 Essential (primary) hypertension: Secondary | ICD-10-CM | POA: Diagnosis not present

## 2021-10-07 DIAGNOSIS — Z9151 Personal history of suicidal behavior: Secondary | ICD-10-CM | POA: Diagnosis not present

## 2021-10-07 DIAGNOSIS — D7219 Other eosinophilia: Secondary | ICD-10-CM | POA: Diagnosis not present

## 2021-10-07 DIAGNOSIS — J9690 Respiratory failure, unspecified, unspecified whether with hypoxia or hypercapnia: Secondary | ICD-10-CM | POA: Diagnosis present

## 2021-10-07 DIAGNOSIS — R404 Transient alteration of awareness: Secondary | ICD-10-CM | POA: Diagnosis not present

## 2021-10-07 DIAGNOSIS — M1990 Unspecified osteoarthritis, unspecified site: Secondary | ICD-10-CM | POA: Diagnosis present

## 2021-10-07 DIAGNOSIS — G9349 Other encephalopathy: Secondary | ICD-10-CM | POA: Diagnosis present

## 2021-10-07 DIAGNOSIS — R739 Hyperglycemia, unspecified: Secondary | ICD-10-CM | POA: Diagnosis not present

## 2021-10-07 DIAGNOSIS — M549 Dorsalgia, unspecified: Secondary | ICD-10-CM | POA: Diagnosis present

## 2021-10-07 DIAGNOSIS — J309 Allergic rhinitis, unspecified: Secondary | ICD-10-CM | POA: Diagnosis present

## 2021-10-07 DIAGNOSIS — F1290 Cannabis use, unspecified, uncomplicated: Secondary | ICD-10-CM | POA: Diagnosis present

## 2021-10-07 DIAGNOSIS — F319 Bipolar disorder, unspecified: Secondary | ICD-10-CM | POA: Diagnosis present

## 2021-10-07 DIAGNOSIS — R0602 Shortness of breath: Secondary | ICD-10-CM | POA: Diagnosis not present

## 2021-10-07 DIAGNOSIS — R069 Unspecified abnormalities of breathing: Secondary | ICD-10-CM | POA: Diagnosis not present

## 2021-10-07 DIAGNOSIS — G8929 Other chronic pain: Secondary | ICD-10-CM | POA: Diagnosis present

## 2021-10-07 DIAGNOSIS — R61 Generalized hyperhidrosis: Secondary | ICD-10-CM | POA: Diagnosis not present

## 2021-10-07 DIAGNOSIS — R0689 Other abnormalities of breathing: Secondary | ICD-10-CM | POA: Diagnosis not present

## 2021-10-07 DIAGNOSIS — J9811 Atelectasis: Secondary | ICD-10-CM | POA: Diagnosis not present

## 2021-10-07 DIAGNOSIS — Z20822 Contact with and (suspected) exposure to covid-19: Secondary | ICD-10-CM | POA: Diagnosis not present

## 2021-10-07 DIAGNOSIS — F17210 Nicotine dependence, cigarettes, uncomplicated: Secondary | ICD-10-CM | POA: Diagnosis present

## 2021-10-07 DIAGNOSIS — Z743 Need for continuous supervision: Secondary | ICD-10-CM | POA: Diagnosis not present

## 2021-10-07 DIAGNOSIS — J9601 Acute respiratory failure with hypoxia: Secondary | ICD-10-CM | POA: Diagnosis not present

## 2021-10-07 DIAGNOSIS — R7989 Other specified abnormal findings of blood chemistry: Secondary | ICD-10-CM | POA: Diagnosis not present

## 2021-10-07 DIAGNOSIS — R0601 Orthopnea: Secondary | ICD-10-CM | POA: Diagnosis not present

## 2021-10-07 DIAGNOSIS — R000 Tachycardia, unspecified: Secondary | ICD-10-CM | POA: Diagnosis not present

## 2021-10-07 DIAGNOSIS — I499 Cardiac arrhythmia, unspecified: Secondary | ICD-10-CM | POA: Diagnosis not present

## 2021-10-07 DIAGNOSIS — Z4682 Encounter for fitting and adjustment of non-vascular catheter: Secondary | ICD-10-CM | POA: Diagnosis not present

## 2021-10-07 LAB — URINALYSIS, ROUTINE W REFLEX MICROSCOPIC
Bilirubin Urine: NEGATIVE
Glucose, UA: NEGATIVE mg/dL
Hgb urine dipstick: NEGATIVE
Ketones, ur: NEGATIVE mg/dL
Leukocytes,Ua: NEGATIVE
Nitrite: NEGATIVE
Protein, ur: 100 mg/dL — AB
Specific Gravity, Urine: 1.019 (ref 1.005–1.030)
pH: 5 (ref 5.0–8.0)

## 2021-10-07 LAB — COMPREHENSIVE METABOLIC PANEL
ALT: 26 U/L (ref 0–44)
AST: 36 U/L (ref 15–41)
Albumin: 4.9 g/dL (ref 3.5–5.0)
Alkaline Phosphatase: 97 U/L (ref 38–126)
Anion gap: 5 (ref 5–15)
BUN: 10 mg/dL (ref 6–20)
CO2: 30 mmol/L (ref 22–32)
Calcium: 9.1 mg/dL (ref 8.9–10.3)
Chloride: 104 mmol/L (ref 98–111)
Creatinine, Ser: 1.03 mg/dL (ref 0.61–1.24)
GFR, Estimated: >60 mL/min (ref >60)
Glucose, Bld: 152 mg/dL — ABNORMAL HIGH (ref 70–99)
Potassium: 3.7 mmol/L (ref 3.5–5.1)
Sodium: 139 mmol/L (ref 135–145)
Total Bilirubin: 1.5 mg/dL — ABNORMAL HIGH (ref 0.3–1.2)
Total Protein: 7.6 g/dL (ref 6.5–8.1)

## 2021-10-07 LAB — BLOOD GAS, ARTERIAL
Acid-base deficit: 2.5 mmol/L — ABNORMAL HIGH (ref 0.0–2.0)
Bicarbonate: 26.6 mmol/L (ref 20.0–28.0)
Drawn by: 35043
FIO2: 60 %
O2 Saturation: 99.8 %
Patient temperature: 36.8
pCO2 arterial: 64 mmHg — ABNORMAL HIGH (ref 32–48)
pH, Arterial: 7.22 — ABNORMAL LOW (ref 7.35–7.45)
pO2, Arterial: 117 mmHg — ABNORMAL HIGH (ref 83–108)

## 2021-10-07 LAB — CBC WITH DIFFERENTIAL/PLATELET
Abs Immature Granulocytes: 0.03 10*3/uL (ref 0.00–0.07)
Basophils Absolute: 0.2 10*3/uL — ABNORMAL HIGH (ref 0.0–0.1)
Basophils Relative: 1 %
Eosinophils Absolute: 1.1 10*3/uL — ABNORMAL HIGH (ref 0.0–0.5)
Eosinophils Relative: 9 %
HCT: 46.7 % (ref 39.0–52.0)
Hemoglobin: 16 g/dL (ref 13.0–17.0)
Immature Granulocytes: 0 %
Lymphocytes Relative: 25 %
Lymphs Abs: 3.3 10*3/uL (ref 0.7–4.0)
MCH: 32 pg (ref 26.0–34.0)
MCHC: 34.3 g/dL (ref 30.0–36.0)
MCV: 93.4 fL (ref 80.0–100.0)
Monocytes Absolute: 1.7 10*3/uL — ABNORMAL HIGH (ref 0.1–1.0)
Monocytes Relative: 13 %
Neutro Abs: 6.8 10*3/uL (ref 1.7–7.7)
Neutrophils Relative %: 52 %
Platelets: 398 10*3/uL (ref 150–400)
RBC: 5 MIL/uL (ref 4.22–5.81)
RDW: 12.6 % (ref 11.5–15.5)
WBC: 13.1 10*3/uL — ABNORMAL HIGH (ref 4.0–10.5)
nRBC: 0 % (ref 0.0–0.2)

## 2021-10-07 LAB — RAPID URINE DRUG SCREEN, HOSP PERFORMED
Amphetamines: NOT DETECTED
Barbiturates: NOT DETECTED
Benzodiazepines: NOT DETECTED
Cocaine: NOT DETECTED
Opiates: NOT DETECTED
Tetrahydrocannabinol: POSITIVE — AB

## 2021-10-07 LAB — TROPONIN I (HIGH SENSITIVITY)
Troponin I (High Sensitivity): 11 ng/L (ref <18)
Troponin I (High Sensitivity): 11 ng/L (ref <18)

## 2021-10-07 LAB — RESP PANEL BY RT-PCR (FLU A&B, COVID) ARPGX2
Influenza A by PCR: NEGATIVE
Influenza B by PCR: NEGATIVE
SARS Coronavirus 2 by RT PCR: NEGATIVE

## 2021-10-07 LAB — BLOOD GAS, VENOUS
Acid-base deficit: 1.8 mmol/L (ref 0.0–2.0)
Bicarbonate: 30.2 mmol/L — ABNORMAL HIGH (ref 20.0–28.0)
Drawn by: 1856
O2 Saturation: 99.2 %
Patient temperature: 36.9
pCO2, Ven: 93 mmHg (ref 44–60)
pH, Ven: 7.12 — CL (ref 7.25–7.43)
pO2, Ven: 228 mmHg — ABNORMAL HIGH (ref 32–45)

## 2021-10-07 LAB — PROTIME-INR
INR: 1.2 (ref 0.8–1.2)
Prothrombin Time: 14.6 seconds (ref 11.4–15.2)

## 2021-10-07 LAB — LACTIC ACID, PLASMA
Lactic Acid, Venous: 0.8 mmol/L (ref 0.5–1.9)
Lactic Acid, Venous: 0.9 mmol/L (ref 0.5–1.9)

## 2021-10-07 LAB — BRAIN NATRIURETIC PEPTIDE: B Natriuretic Peptide: 24 pg/mL (ref 0.0–100.0)

## 2021-10-07 LAB — CBG MONITORING, ED: Glucose-Capillary: 154 mg/dL — ABNORMAL HIGH (ref 70–99)

## 2021-10-07 LAB — D-DIMER, QUANTITATIVE: D-Dimer, Quant: 0.75 ug/mL-FEU — ABNORMAL HIGH (ref 0.00–0.50)

## 2021-10-07 MED ORDER — NALOXONE HCL 2 MG/2ML IJ SOSY
PREFILLED_SYRINGE | INTRAMUSCULAR | Status: AC
  Administered 2021-10-07: 2 mg
  Filled 2021-10-07: qty 2

## 2021-10-07 MED ORDER — ALBUTEROL SULFATE (2.5 MG/3ML) 0.083% IN NEBU
INHALATION_SOLUTION | RESPIRATORY_TRACT | Status: AC
  Administered 2021-10-07: 10 mg via RESPIRATORY_TRACT
  Filled 2021-10-07: qty 12

## 2021-10-07 MED ORDER — IPRATROPIUM BROMIDE 0.02 % IN SOLN
RESPIRATORY_TRACT | Status: AC
  Administered 2021-10-07: 1 mg
  Filled 2021-10-07: qty 5

## 2021-10-07 MED ORDER — ALBUTEROL SULFATE (2.5 MG/3ML) 0.083% IN NEBU: 10.0000 mg | INHALATION_SOLUTION | Freq: Once | RESPIRATORY_TRACT | Status: AC

## 2021-10-07 MED ORDER — ROCURONIUM BROMIDE 10 MG/ML (PF) SYRINGE
PREFILLED_SYRINGE | INTRAVENOUS | Status: AC
  Administered 2021-10-07: 80 mg
  Filled 2021-10-07: qty 10

## 2021-10-07 MED ORDER — CHLORHEXIDINE GLUCONATE CLOTH 2 % EX PADS
6.0000 | MEDICATED_PAD | Freq: Every day | CUTANEOUS | Status: DC
  Administered 2021-10-08 (×2): 6 via TOPICAL

## 2021-10-07 MED ORDER — NALOXONE HCL 2 MG/2ML IJ SOSY
2.0000 mg | PREFILLED_SYRINGE | Freq: Once | INTRAMUSCULAR | Status: AC
  Administered 2021-10-07: 2 mg via INTRAVENOUS

## 2021-10-07 MED ORDER — TERBUTALINE SULFATE 1 MG/ML IJ SOLN
0.2500 mg | Freq: Once | INTRAMUSCULAR | Status: AC
  Administered 2021-10-07: 0.25 mg via SUBCUTANEOUS
  Filled 2021-10-07: qty 1

## 2021-10-07 MED ORDER — MAGNESIUM SULFATE 2 GM/50ML IV SOLN
2.0000 g | Freq: Once | INTRAVENOUS | Status: AC
  Administered 2021-10-07: 2 g via INTRAVENOUS
  Filled 2021-10-07: qty 50

## 2021-10-07 MED ORDER — ALBUTEROL SULFATE (2.5 MG/3ML) 0.083% IN NEBU
INHALATION_SOLUTION | RESPIRATORY_TRACT | Status: AC
  Administered 2021-10-07: 5 mg
  Filled 2021-10-07: qty 9

## 2021-10-07 MED ORDER — FENTANYL CITRATE (PF) 100 MCG/2ML IJ SOLN
100.0000 ug | Freq: Once | INTRAMUSCULAR | Status: AC
  Administered 2021-10-07: 100 ug via INTRAVENOUS
  Filled 2021-10-07: qty 2

## 2021-10-07 MED ORDER — FENTANYL 2500MCG IN NS 250ML (10MCG/ML) PREMIX INFUSION
INTRAVENOUS | Status: AC
  Administered 2021-10-07: 25 ug/h via INTRAVENOUS
  Filled 2021-10-07: qty 250

## 2021-10-07 MED ORDER — IPRATROPIUM BROMIDE 0.02 % IN SOLN
0.5000 mg | RESPIRATORY_TRACT | Status: DC
  Administered 2021-10-08 (×2): 0.5 mg via RESPIRATORY_TRACT
  Filled 2021-10-07 (×2): qty 2.5

## 2021-10-07 MED ORDER — PROPOFOL 1000 MG/100ML IV EMUL
INTRAVENOUS | Status: AC
  Administered 2021-10-07: 5 ug/kg/min via INTRAVENOUS
  Filled 2021-10-07: qty 100

## 2021-10-07 MED ORDER — SODIUM CHLORIDE 0.9 % IV BOLUS
1000.0000 mL | Freq: Once | INTRAVENOUS | Status: AC
  Administered 2021-10-07: 1000 mL via INTRAVENOUS

## 2021-10-07 MED ORDER — FENTANYL 2500MCG IN NS 250ML (10MCG/ML) PREMIX INFUSION: 0.0000 ug/h | INTRAVENOUS | Status: DC

## 2021-10-07 MED ORDER — ETOMIDATE 2 MG/ML IV SOLN
INTRAVENOUS | Status: AC
  Administered 2021-10-07: 20 mg
  Filled 2021-10-07: qty 20

## 2021-10-07 MED ORDER — ALBUTEROL SULFATE (2.5 MG/3ML) 0.083% IN NEBU
2.5000 mg | INHALATION_SOLUTION | RESPIRATORY_TRACT | Status: DC
  Administered 2021-10-08 (×2): 2.5 mg via RESPIRATORY_TRACT
  Filled 2021-10-07 (×2): qty 3

## 2021-10-07 MED ORDER — ORAL CARE MOUTH RINSE
15.0000 mL | OROMUCOSAL | Status: DC
Start: 2021-10-08 — End: 2021-10-08
  Administered 2021-10-08 (×6): 15 mL via OROMUCOSAL

## 2021-10-07 MED ORDER — PROPOFOL 1000 MG/100ML IV EMUL
5.0000 ug/kg/min | INTRAVENOUS | Status: DC
  Filled 2021-10-07: qty 100

## 2021-10-07 MED ORDER — METHYLPREDNISOLONE SODIUM SUCC 125 MG IJ SOLR
125.0000 mg | Freq: Once | INTRAMUSCULAR | Status: AC
  Administered 2021-10-07: 125 mg via INTRAVENOUS
  Filled 2021-10-07: qty 2

## 2021-10-07 MED ORDER — ORAL CARE MOUTH RINSE: 15.0000 mL | OROMUCOSAL | Status: DC | PRN

## 2021-10-07 NOTE — ED Notes (Addendum)
Pt intubated with 7.5 ETT by Dr. Renita Papa. Secured at 24cm at the lip per [employer], RT. ETCO2 color change positive, equal breath sounds bilaterally.

## 2021-10-07 NOTE — ED Notes (Signed)
EDP aware of high bp. EDP requested propofol started.

## 2021-10-07 NOTE — Progress Notes (Signed)
Transported patient to and from [place] without incident.  Suctioned before and after trip, still getting tan secretions.

## 2021-10-07 NOTE — H&P (Signed)
NAME:  Wesley Beck, MRN:  [id number], DOB:  December 01, 1970, LOS: 1 ADMISSION DATE:  10/07/2021, CONSULTATION DATE:  9/1 REFERRING MD:  Suezanne Jacquet, CHIEF COMPLAINT:  SOB   History of Present Illness:  Patient is encephalopathic and/or intubated. Therefore history has been obtained from chart review.   Wesley Beck, is a 51 y.o. male, who presented to the [facility] via EMS with a chief complaint of SOB  They have a pertinent past medical history of asthma, Bipolar disorder 1, depression, schizophrenia  Prior to arrival reports of paritne with 50% SPOT on RA. Patient unresponsive and diaphoretic. Reports of asthma attack.  The patient was intubated in the [facility] ED. Tmax 98.7 WBC 13.1. Lactate WNL. Cxr with no obvious infiltrate, pneumo, effusion. CT head negative  PCCM was consulted for admission  Pertinent  Medical History  asthma, Bipolar disorder 1, depression, schizophrenia  Significant Hospital Events: Including procedures, antibiotic start and stop dates in addition to other pertinent events   9/1 Presented to [facility] ED, CT head negative, ETT>, TXR to [facility]  Interim History / Subjective:  See above  Drips: Propofol 35 mcg Fent 50 mcg  Unable to obtain subjective evaluation due to patient status  Objective   Blood pressure 96/81, pulse 72, temperature 98.7 F (37.1 C), temperature source Axillary, resp. rate (!) 28, height 5\' 9"  (1.753 m), weight 83.1 kg, SpO2 97 %.    Vent Mode: PRVC FiO2 (%):  [60 %-100 %] 60 % Set Rate:  [18 bmp-28 bmp] 28 bmp Vt Set:  [350 mL-420 mL] 420 mL PEEP:  [5 cmH20] 5 cmH20 Plateau Pressure:  [16 cmH20-33 cmH20] 16 cmH20  No intake or output data in the 24 hours ending 10/08/21 0044 Filed Weights   10/07/21 1852  Weight: 83.1 kg    Examination: General: In bed, NAD, appears comfortable HEENT: MM pink/moist, anicteric, atraumatic Neuro: PERRL 58mm, Sedated, RASS -4 CV: S1S2, NSR, no m/r/g appreciated PULM:  distant wheezes, trachea midline,  chest expansion symmetric GI: soft, bsx4 active, non-tender   Extremities: warm/dry, no pretibial edema, capillary refill less than 3 seconds  Skin:  no rashes or lesions noted  Labs/Imaging Glucose 154 CMP reviewed Bilirubin 1.5 BMP within normal limits, troponin 11>11 WBC 13.1 COVID flu negative Chest x-ray: Stable ETT, no pneumothorax, no effusion, no infiltrate noted Lactate 0.8 > 0.9 ABG 7.22/64/117/26 D dimer 0.75 UDS positive for THC CT head: negative for acute issues 12 lead: ST, no signigicant St changes noted  Resolved Hospital Problem list     Assessment & Plan:  Acute respiratory failure with hypoxia and hypercapnia Acute asthma exacerbation Active smoker, UDS + for THC WBC 13.1. Afebrile. COVID flu negative. Lactate 0.8 > 0.9. Chest x-ray: Stable ETT, no pneumothorax, no effusion, no infiltrate noted. Lactate 0.8 > 0.9. Do not think infectious. Slight leukocytosis may be due to stress. 05-30-1980 BMP within normal limits, troponin 11>11. D dimer 0.75. UDS positive for THC. ABG 7.22/64/117/26. CT head: negative for acute issues. Mag, 125 solumedrol, narcan, albuterol given in ED. -Admit to ICU -PRN and scheduled albuterol nebs, Ipratropium mg nebulized q4-6 hr -Methylprednisolone 125 mg IV x1 given in ED. Start methylpred 60mg  q8h. -LTVV strategy with tidal volumes of 6-8 cc/kg ideal body weight. Target low respiratory rate to avoid auto-peep. -Goal plateau pressures below 30 and driving pressures of 15 -Wean PEEP/FiO2 for SpO2  92-98% -Follow intermittent CXR and ABG PRN -VAP bundle -Daily SAT and SBT -PAD bundle with Propofol gtt and  fentanyl gtt. Goal rass 0 to -1.  -Smoking cessation education when appropriate. -Check echo and dopplers.  HX Bipolar disorder 1, depression, schizophrenia -resume home psych medications in AM  Hyperglycemia BG 154. On Steroids. -Blood Glucose goal 140-180. -SSI   Best Practice (right click and "Reselect all SmartList Selections"  daily)   Diet/type: NPO w/ oral meds DVT prophylaxis: prophylactic heparin  GI prophylaxis: PPI Lines: N/A Foley:  Yes, and it is still needed Code Status:  full code Last date of multidisciplinary goals of care discussion [Pending]  Labs   CBC: Recent Labs  Lab 10/07/21 1800  WBC 13.1*  NEUTROABS 6.8  HGB 16.0  HCT 46.7  MCV 93.4  PLT 398    Basic Metabolic Panel: Recent Labs  Lab 10/07/21 1800  NA 139  K 3.7  CL 104  CO2 30  GLUCOSE 152*  BUN 10  CREATININE 1.03  CALCIUM 9.1   GFR: Estimated Creatinine Clearance: 86.8 mL/min (by C-G formula based on SCr of 1.03 mg/dL). Recent Labs  Lab 10/07/21 1800 10/07/21 1856 10/07/21 1945  WBC 13.1*  --   --   LATICACIDVEN  --  0.8 0.9    Liver Function Tests: Recent Labs  Lab 10/07/21 1800  AST 36  ALT 26  ALKPHOS 97  BILITOT 1.5*  PROT 7.6  ALBUMIN 4.9   No results for input(s): "LIPASE", "AMYLASE" in the last 168 hours. No results for input(s): "AMMONIA" in the last 168 hours.  ABG    Component Value Date/Time   PHART 7.22 (L) 10/07/2021 2054   PCO2ART 64 (H) 10/07/2021 2054   PO2ART 117 (H) 10/07/2021 2054   HCO3 26.6 10/07/2021 2054   ACIDBASEDEF 2.5 (H) 10/07/2021 2054   O2SAT 99.8 10/07/2021 2054     Coagulation Profile: Recent Labs  Lab 10/07/21 1800  INR 1.2    Cardiac Enzymes: No results for input(s): "CKTOTAL", "CKMB", "CKMBINDEX", "TROPONINI" in the last 168 hours.  HbA1C: No results found for: "HGBA1C"  CBG: Recent Labs  Lab 10/07/21 1751  GLUCAP 154*    Review of Systems:   Unable to obtain ROS due to patient status  Past Medical History:  He,  has a past medical history of Arthritis, Asthma, Bipolar 1 disorder (HCC), Chronic back pain, Depression, Schizophrenia, paranoid type (HCC), and Seasonal allergies.   Surgical History:  History reviewed. No pertinent surgical history.   Social History:   reports that he has been smoking cigarettes. He has a 12.50  pack-year smoking history. He has never used smokeless tobacco. He reports current drug use. Drug: Marijuana. He reports that he does not drink alcohol.   Family History:  His family history includes COPD in his mother; Cancer in his father and another family member; Hypertension in his father.   Allergies No Known Allergies   Home Medications  Prior to Admission medications   Medication Sig Start Date End Date Taking? Authorizing Provider  albuterol (PROVENTIL HFA;VENTOLIN HFA) 108 (90 Base) MCG/ACT inhaler Inhale 1-2 puffs into the lungs every 6 (six) hours as needed for wheezing or shortness of breath. 04/04/18  Yes Clapacs, Jackquline Denmark, MD  carvedilol (COREG) 3.125 MG tablet Take 3.125 mg by mouth 2 (two) times daily. 07/06/21  Yes [provider]  meloxicam (MOBIC) 7.5 MG tablet Take 7.5 mg by mouth 2 (two) times daily.   Yes [provider]  OLANZapine (ZYPREXA) 5 MG tablet Take 5 mg by mouth 2 (two) times daily. 10/04/21  Yes [provider]  WIXELA INHUB 250-50 MCG/ACT AEPB Inhale 1 puff into the lungs 2 (two) times daily. 06/30/21  Yes [provider]     Critical care time: 41 minutes     Gershon Mussel., MSN, APRN, AGACNP-BC [facility]  10/08/2021 , 12:44 AM  Please see [email or web address] for pager details  If no response, please call [phone] After hours, please call Elink at [phone]

## 2021-10-07 NOTE — Progress Notes (Addendum)
Tried taking patient up to 8cc Vt of 560, but patient's pressures started climbing and had to take him back to 6cc Vt of 420.  Patient's pressures started going back down.  Critical care picking up patient to take to [facility].  Report has been called to 6M RT.

## 2021-10-07 NOTE — ED Triage Notes (Addendum)
Pt brought in by [facility] from home with c/o SOB. Initial 50% on RA upon EMS arrival, 10L O2 placed with increase of O2 to 82%. O2 then increased to 15L  to 86%. Pt arrives to ED unresponsive and extremely diaphoretic. Upon arrival to ED, EDRN immediately started assisting ventilations with BVM and attempting to start another IV.

## 2021-10-07 NOTE — ED Provider Notes (Signed)
[facility] Provider Note  CSN: [id number] Arrival date & time: 10/07/21 1749  Chief Complaint(s) Shortness of Breath  HPI Wesley Beck is a 51 y.o. male with history of asthma, bipolar disorder, schizophrenia presenting to the emergency department with shortness of breath.  Patient was picked up by EMS, reportedly patient was putting on shorts and suddenly became short of breath per EMS.  No prehospital treatments were given besides oxygen.  Patient was reportedly 50% on room air on EMS arrival.  History otherwise limited due to obtunded mental status, respiratory distress   Past Medical History Past Medical History:  Diagnosis Date   Arthritis    Asthma    Bipolar 1 disorder (HCC)    Chronic back pain    Depression    Schizophrenia, paranoid type (HCC)    Seasonal allergies    Patient Active Problem List   Diagnosis Date Noted   Respiratory failure (HCC) 10/07/2021   Schizoaffective disorder (HCC) 04/03/2018   Cannabis abuse 04/03/2018   Suicidal behavior with attempted self-injury (HCC) 04/02/2018   Bipolar 1 disorder, mixed (HCC) 02/28/2013   DISORDER, BIPOLAR NOS 06/05/2006   TOBACCO ABUSE 06/05/2006   ALLERGIC RHINITIS 06/05/2006   Asthma 06/05/2006   SEBACEOUS CYST 06/05/2006   LOW BACK PAIN, CHRONIC 06/05/2006   MALAISE AND FATIGUE 06/05/2006   Home Medication(s) Prior to Admission medications   Medication Sig Start Date End Date Taking? Authorizing Provider  albuterol (PROVENTIL HFA;VENTOLIN HFA) 108 (90 Base) MCG/ACT inhaler Inhale 1-2 puffs into the lungs every 6 (six) hours as needed for wheezing or shortness of breath. 04/04/18  Yes Clapacs, Jackquline Denmark, MD  carvedilol (COREG) 3.125 MG tablet Take 3.125 mg by mouth 2 (two) times daily. 07/06/21  Yes [provider]  meloxicam (MOBIC) 7.5 MG tablet Take 7.5 mg by mouth 2 (two) times daily.   Yes [provider]  OLANZapine (ZYPREXA) 5 MG tablet Take 5 mg by mouth 2 (two) times daily. 10/04/21   Yes [provider]  Monte Fantasia INHUB 250-50 MCG/ACT AEPB Inhale 1 puff into the lungs 2 (two) times daily. 06/30/21  Yes [provider]                                                                                                                                    Past Surgical History History reviewed. No pertinent surgical history. Family History Family History  Problem Relation Age of Onset   COPD Mother    Hypertension Father    Cancer Father    Cancer Other     Social History Social History   Tobacco Use   Smoking status: Every Day    Packs/day: 0.50    Years: 25.00    Total pack years: 12.50    Types: Cigarettes   Smokeless tobacco: Never  Vaping Use   Vaping Use: Every day  Substance Use Topics   Alcohol use: No  Comment: social use   Drug use: Yes    Types: Marijuana   Allergies Patient has no known allergies.  Review of Systems Review of Systems  Unable to perform ROS: Acuity of condition    Physical Exam Vital Signs  I have reviewed the triage vital signs BP 96/81   Pulse 83   Temp 98.7 F (37.1 C) (Axillary)   Resp (!) 28   Ht 5\' 9"  (1.753 m)   Wt 83.1 kg   SpO2 97%   BMI 27.05 kg/m  Physical Exam Vitals and nursing note reviewed.  Constitutional:      General: He is in acute distress.     Appearance: Normal appearance. He is ill-appearing and diaphoretic.  HENT:     Head: Normocephalic and atraumatic.     Nose: No congestion.     Mouth/Throat:     Mouth: Mucous membranes are moist.  Eyes:     Conjunctiva/sclera: Conjunctivae normal.  Cardiovascular:     Rate and Rhythm: Regular rhythm. Tachycardia present.  Pulmonary:     Effort: Tachypnea, accessory muscle usage and respiratory distress present.     Breath sounds: Decreased breath sounds (diffusely) and wheezing (diffusely) present.  Abdominal:     General: Abdomen is flat.     Palpations: Abdomen is soft.     Tenderness: There is no abdominal tenderness.   Musculoskeletal:     Right lower leg: No edema.     Left lower leg: No edema.  Skin:    General: Skin is warm.     Capillary Refill: Capillary refill takes less than 2 seconds.  Neurological:     Mental Status: Mental status is at baseline.     GCS: GCS eye subscore is 1. GCS verbal subscore is 1. GCS motor subscore is 1.     Comments: Obtunded mental status, not responding to sternal rub     ED Results and Treatments Labs (all labs ordered are listed, but only abnormal results are displayed) Labs Reviewed  COMPREHENSIVE METABOLIC PANEL - Abnormal; Notable for the following components:      Result Value   Glucose, Bld 152 (*)    Total Bilirubin 1.5 (*)    All other components within normal limits  CBC WITH DIFFERENTIAL/PLATELET - Abnormal; Notable for the following components:   WBC 13.1 (*)    Monocytes Absolute 1.7 (*)    Eosinophils Absolute 1.1 (*)    Basophils Absolute 0.2 (*)    All other components within normal limits  BLOOD GAS, VENOUS - Abnormal; Notable for the following components:   pH, Ven 7.12 (*)    pCO2, Ven 93 (*)    pO2, Ven 228 (*)    Bicarbonate 30.2 (*)    All other components within normal limits  BLOOD GAS, ARTERIAL - Abnormal; Notable for the following components:   pH, Arterial 7.22 (*)    pCO2 arterial 64 (*)    pO2, Arterial 117 (*)    Acid-base deficit 2.5 (*)    All other components within normal limits  URINALYSIS, ROUTINE W REFLEX MICROSCOPIC - Abnormal; Notable for the following components:   Protein, ur 100 (*)    Bacteria, UA RARE (*)    All other components within normal limits  RAPID URINE DRUG SCREEN, HOSP PERFORMED - Abnormal; Notable for the following components:   Tetrahydrocannabinol POSITIVE (*)    All other components within normal limits  D-DIMER, QUANTITATIVE - Abnormal; Notable for the following components:   D-Dimer,  Quant 0.75 (*)    All other components within normal limits  CBG MONITORING, ED - Abnormal; Notable  for the following components:   Glucose-Capillary 154 (*)    All other components within normal limits  RESP PANEL BY RT-PCR (FLU A&B, COVID) ARPGX2  MRSA NEXT GEN BY PCR, NASAL  BRAIN NATRIURETIC PEPTIDE  PROTIME-INR  LACTIC ACID, PLASMA  LACTIC ACID, PLASMA  TROPONIN I (HIGH SENSITIVITY)  TROPONIN I (HIGH SENSITIVITY)                                                                                                                          Radiology CT Head Wo Contrast  Result Date: 10/07/2021 CLINICAL DATA:  Mental status change, unknown cause EXAM: CT HEAD WITHOUT CONTRAST TECHNIQUE: Contiguous axial images were obtained from the base of the skull through the vertex without intravenous contrast. RADIATION DOSE REDUCTION: This exam was performed according to the departmental dose-optimization program which includes automated exposure control, adjustment of the mA and/or kV according to patient size and/or use of iterative reconstruction technique. COMPARISON:  None Available. FINDINGS: Brain: Normal anatomic configuration. No abnormal intra or extra-axial mass lesion or fluid collection. No abnormal mass effect or midline shift. No evidence of acute intracranial hemorrhage or infarct. Ventricular size is normal. Cerebellum unremarkable. Vascular: Unremarkable Skull: Intact Sinuses/Orbits: Paranasal sinuses are clear. Orbits are unremarkable. Other: Mastoid air cells and middle ear cavities are clear. IMPRESSION: No acute intracranial abnormality. Electronically Signed   By: Fidela Salisbury M.D.   On: 10/07/2021 21:29   DG Chest 1 View  Result Date: 10/07/2021 CLINICAL DATA:  Status post intubation. Shortness of breath. Diaphoresis. EXAM: CHEST  1 VIEW COMPARISON:  06/22/21 FINDINGS: ET tube tip is in satisfactory position above the carina. There is a nasogastric tube with tip and side port below the GE junction. Heart size and mediastinal contours appear normal. No pleural effusion or edema  identified. No airspace opacities. The visualized osseous structures are unremarkable. IMPRESSION: Satisfactory position of ET tube and nasogastric tube. Lungs are clear. Electronically Signed   By: Kerby Moors M.D.   On: 10/07/2021 18:41    Pertinent labs & imaging results that were available during my care of the patient were reviewed by me and considered in my medical decision making (see MDM for details).  Medications Ordered in ED Medications  propofol (DIPRIVAN) 1000 MG/100ML infusion (55 mcg/kg/min  70.7 kg (Ideal) Intravenous Rate/Dose Change 10/07/21 2145)  albuterol (PROVENTIL) (2.5 MG/3ML) 0.083% nebulizer solution 2.5 mg (2.5 mg Nebulization Not Given 10/07/21 2217)  ipratropium (ATROVENT) nebulizer solution 0.5 mg (0.5 mg Nebulization Not Given 10/07/21 2217)  fentaNYL 2550mcg in NS 2104mL (47mcg/ml) infusion-PREMIX (75 mcg/hr Intravenous Rate/Dose Change 10/07/21 2132)  Chlorhexidine Gluconate Cloth 2 % PADS 6 each (has no administration in time range)  Oral care mouth rinse (has no administration in time range)  Oral care mouth rinse (has no administration in time range)  naloxone (NARCAN) 2 MG/2ML injection (  2 mg  Given 10/07/21 1755)  rocuronium bromide 100 MG/10ML SOSY (80 mg  Given by Other 10/07/21 1802)  etomidate (AMIDATE) 2 MG/ML injection (20 mg  Given by Other 10/07/21 1801)  methylPREDNISolone sodium succinate (SOLU-MEDROL) 125 mg/2 mL injection 125 mg (125 mg Intravenous Given by Other 10/07/21 1809)  magnesium sulfate IVPB 2 g 50 mL (0 g Intravenous Stopped 10/07/21 1857)  naloxone (NARCAN) injection 2 mg (2 mg Intravenous Given by Other 10/07/21 1751)  sodium chloride 0.9 % bolus 1,000 mL (0 mLs Intravenous Stopped 10/07/21 1830)  albuterol (PROVENTIL) (2.5 MG/3ML) 0.083% nebulizer solution 10 mg (10 mg Nebulization Given by Other 10/07/21 1814)  terbutaline (BRETHINE) injection 0.25 mg (0.25 mg Subcutaneous Given 10/07/21 1858)  albuterol (PROVENTIL) (2.5 MG/3ML) 0.083% nebulizer  solution 10 mg (10 mg Nebulization Given 10/07/21 1908)  fentaNYL (SUBLIMAZE) injection 100 mcg (100 mcg Intravenous Given 10/07/21 2021)  ipratropium (ATROVENT) 0.02 % nebulizer solution (1 mg  Given 10/07/21 1942)  albuterol (PROVENTIL) (2.5 MG/3ML) 0.083% nebulizer solution (5 mg  Given 10/07/21 1942)                                                                                                                                     Procedures .Critical Care  Performed by: Cristie Hem, MD Authorized by: Cristie Hem, MD   Critical care provider statement:    Critical care time (minutes):  30   Critical care was necessary to treat or prevent imminent or life-threatening deterioration of the following conditions:  Respiratory failure, CNS failure or compromise and circulatory failure   Critical care was time spent personally by me on the following activities:  Discussions with consultants, evaluation of patient's response to treatment, examination of patient, ordering and review of laboratory studies, ordering and review of radiographic studies, ordering and performing treatments and interventions, pulse oximetry, re-evaluation of patient's condition, review of old charts and ventilator management   Care discussed with: admitting provider   Date/Time: 10/07/2021 6:53 PM  Performed by: Cristie Hem, MDPre-anesthesia Checklist: Patient identified, Suction available, Patient being monitored and Timeout performed Oxygen Delivery Method: Ambu bag Preoxygenation: Pre-oxygenation with 100% oxygen Induction Type: Rapid sequence Laryngoscope Size: 4 Grade View: Grade I Tube size: 7.5 mm Number of attempts: 1 Airway Equipment and Method: Stylet and Video-laryngoscopy Placement Confirmation: ETT inserted through vocal cords under direct vision, Positive ETCO2, CO2 detector and Breath sounds checked- equal and bilateral Secured at: 24 cm Tube secured with: ETT holder Dental Injury: Teeth  and Oropharynx as per pre-operative assessment       (including critical care time)  Medical Decision Making / ED Course   MDM:  51 year old male presenting to the emergency department with respiratory distress.  On arrival, patient obtunded.  Not protecting airway.  Not responding to painful stimuli or sternal rub.  Needing bag-valve-mask respiratory support to maintain oxygen saturation greater than 90%.  Not  candidate for BiPAP  Given need for airway protection patient intubated.  Oxygen saturation reassuring.  Pulmonary exam notable for severe wheezing and tight breath sounds throughout.  Patient tolerating ventilation well.  No sign of pneumothorax on chest x-ray.  Will treat with steroids, magnesium terbutaline, inline nebulization treatments.  Will obtain CT head given decreased mental status.  Will obtain VBG to evaluate for CO2 retention.  No obvious pneumonia on my interpretation of chest x-ray we will follow-up radiologist read. Clinical Course as of 10/08/21 0024  Fri Oct 07, 2021  1936 Dr. Chase Caller recommends transfer to [facility] or [facility] hospital given better ICU support. [WS]  Sat Oct 08, 2021  0023 Admitted to ICU. pCO2 improved with treatment.  [WS]    Clinical Course User Index [WS] Cristie Hem, MD     Additional history obtained: -Additional history obtained from ems -External records from outside source obtained and reviewed including: Chart review including previous notes, labs, imaging, consultation notes   Lab Tests: -I ordered, reviewed, and interpreted labs.   The pertinent results include:   Labs Reviewed  COMPREHENSIVE METABOLIC PANEL - Abnormal; Notable for the following components:      Result Value   Glucose, Bld 152 (*)    Total Bilirubin 1.5 (*)    All other components within normal limits  CBC WITH DIFFERENTIAL/PLATELET - Abnormal; Notable for the following components:   WBC 13.1 (*)    Monocytes Absolute 1.7 (*)    Eosinophils Absolute  1.1 (*)    Basophils Absolute 0.2 (*)    All other components within normal limits  BLOOD GAS, VENOUS - Abnormal; Notable for the following components:   pH, Ven 7.12 (*)    pCO2, Ven 93 (*)    pO2, Ven 228 (*)    Bicarbonate 30.2 (*)    All other components within normal limits  BLOOD GAS, ARTERIAL - Abnormal; Notable for the following components:   pH, Arterial 7.22 (*)    pCO2 arterial 64 (*)    pO2, Arterial 117 (*)    Acid-base deficit 2.5 (*)    All other components within normal limits  URINALYSIS, ROUTINE W REFLEX MICROSCOPIC - Abnormal; Notable for the following components:   Protein, ur 100 (*)    Bacteria, UA RARE (*)    All other components within normal limits  RAPID URINE DRUG SCREEN, HOSP PERFORMED - Abnormal; Notable for the following components:   Tetrahydrocannabinol POSITIVE (*)    All other components within normal limits  D-DIMER, QUANTITATIVE - Abnormal; Notable for the following components:   D-Dimer, Quant 0.75 (*)    All other components within normal limits  CBG MONITORING, ED - Abnormal; Notable for the following components:   Glucose-Capillary 154 (*)    All other components within normal limits  RESP PANEL BY RT-PCR (FLU A&B, COVID) ARPGX2  MRSA NEXT GEN BY PCR, NASAL  BRAIN NATRIURETIC PEPTIDE  PROTIME-INR  LACTIC ACID, PLASMA  LACTIC ACID, PLASMA  TROPONIN I (HIGH SENSITIVITY)  TROPONIN I (HIGH SENSITIVITY)      EKG   EKG Interpretation  Date/Time:  Friday October 07 2021 17:51:37 EDT Ventricular Rate:  112 PR Interval:  166 QRS Duration: 92 QT Interval:  313 QTC Calculation: 428 R Axis:   0 Text Interpretation: Sinus tachycardia Ventricular premature complex Aberrant conduction of SV complex(es) Indeterminate axis Low voltage with right axis deviation Confirmed by Garnette Gunner [phone]) on 10/07/2021 7:05:34 PM  Imaging Studies ordered: I ordered imaging studies including CXR On my interpretation imaging demonstrates  clear lungs I independently visualized and interpreted imaging. I agree with the radiologist interpretation   Medicines ordered and prescription drug management: Meds ordered this encounter  Medications   naloxone (NARCAN) 2 MG/2ML injection    White, Meagan R: cabinet override   rocuronium bromide 100 MG/10ML SOSY    White, Meagan R: cabinet override   etomidate (AMIDATE) 2 MG/ML injection    White, Meagan R: cabinet override   methylPREDNISolone sodium succinate (SOLU-MEDROL) 125 mg/2 mL injection 125 mg   magnesium sulfate IVPB 2 g 50 mL   propofol (DIPRIVAN) 1000 MG/100ML infusion   naloxone (NARCAN) injection 2 mg   albuterol (PROVENTIL) (2.5 MG/3ML) 0.083% nebulizer solution    Broadnax, Lucretia A: cabinet override   sodium chloride 0.9 % bolus 1,000 mL   albuterol (PROVENTIL) (2.5 MG/3ML) 0.083% nebulizer solution 10 mg   terbutaline (BRETHINE) injection 0.25 mg   propofol (DIPRIVAN) 1000 MG/100ML infusion    White, Meagan R: cabinet override   albuterol (PROVENTIL) (2.5 MG/3ML) 0.083% nebulizer solution 10 mg   albuterol (PROVENTIL) (2.5 MG/3ML) 0.083% nebulizer solution    Broadnax, Lucretia A: cabinet override   fentaNYL (SUBLIMAZE) injection 100 mcg   ipratropium (ATROVENT) 0.02 % nebulizer solution    Hali Marry F: cabinet override   albuterol (PROVENTIL) (2.5 MG/3ML) 0.083% nebulizer solution    Hali Marry F: cabinet override   albuterol (PROVENTIL) (2.5 MG/3ML) 0.083% nebulizer solution 2.5 mg   ipratropium (ATROVENT) nebulizer solution 0.5 mg   fentaNYL 2577mcg in NS 235mL (48mcg/ml) infusion-PREMIX   fentaNYL 10 mcg/ml infusion    Verlene Mayer A: cabinet override   Chlorhexidine Gluconate Cloth 2 % PADS 6 each   Oral care mouth rinse   Oral care mouth rinse    -I have reviewed the patients home medicines and have made adjustments as needed   Consultations Obtained: I requested consultation with the intensivist,  and discussed lab and  imaging findings as well as pertinent plan - they recommend: admit   Cardiac Monitoring: The patient was maintained on a cardiac monitor.  I personally viewed and interpreted the cardiac monitored which showed an underlying rhythm of: NSR  Social Determinants of Health:  Factors impacting patients care include: current smoker   Reevaluation: After the interventions noted above, I reevaluated the patient and found that they have improved  Co morbidities that complicate the patient evaluation  Past Medical History:  Diagnosis Date   Arthritis    Asthma    Bipolar 1 disorder (HCC)    Chronic back pain    Depression    Schizophrenia, paranoid type ([place])    Seasonal allergies       Dispostion: Transfer    Final Clinical Impression(s) / ED Diagnoses Final diagnoses:  Hypoxia  Severe persistent asthma with acute exacerbation  Acute respiratory failure with hypoxia and hypercarbia ([place])     This chart was dictated using voice recognition software.  Despite best efforts to proofread,  errors can occur which can change the documentation meaning.    Cristie Hem, MD 10/08/21 Laureen Abrahams

## 2021-10-07 NOTE — ED Notes (Addendum)
Date and time results received: 10/07/21 1911 (use smartphrase ".now" to insert current time)  Test: VBG Critical Value: pH 7.12   PCO2 93  Name of Provider Notified: Doreene Eland, MD

## 2021-10-08 ENCOUNTER — Inpatient Hospital Stay (HOSPITAL_COMMUNITY): Payer: Medicare Other

## 2021-10-08 DIAGNOSIS — J9602 Acute respiratory failure with hypercapnia: Secondary | ICD-10-CM

## 2021-10-08 DIAGNOSIS — R0902 Hypoxemia: Secondary | ICD-10-CM

## 2021-10-08 DIAGNOSIS — R0601 Orthopnea: Secondary | ICD-10-CM | POA: Diagnosis not present

## 2021-10-08 DIAGNOSIS — R7989 Other specified abnormal findings of blood chemistry: Secondary | ICD-10-CM | POA: Diagnosis not present

## 2021-10-08 DIAGNOSIS — J9601 Acute respiratory failure with hypoxia: Secondary | ICD-10-CM | POA: Diagnosis not present

## 2021-10-08 DIAGNOSIS — D7219 Other eosinophilia: Secondary | ICD-10-CM

## 2021-10-08 DIAGNOSIS — R791 Abnormal coagulation profile: Secondary | ICD-10-CM | POA: Diagnosis not present

## 2021-10-08 DIAGNOSIS — J9690 Respiratory failure, unspecified, unspecified whether with hypoxia or hypercapnia: Secondary | ICD-10-CM | POA: Diagnosis not present

## 2021-10-08 DIAGNOSIS — J4551 Severe persistent asthma with (acute) exacerbation: Secondary | ICD-10-CM

## 2021-10-08 DIAGNOSIS — R0602 Shortness of breath: Secondary | ICD-10-CM | POA: Diagnosis not present

## 2021-10-08 LAB — POCT I-STAT 7, (LYTES, BLD GAS, ICA,H+H)
Acid-Base Excess: 0 mmol/L (ref 0.0–2.0)
Bicarbonate: 26.7 mmol/L (ref 20.0–28.0)
Calcium, Ion: 1.19 mmol/L (ref 1.15–1.40)
HCT: 40 % (ref 39.0–52.0)
Hemoglobin: 13.6 g/dL (ref 13.0–17.0)
O2 Saturation: 98 %
Patient temperature: 98.7
Potassium: 3.5 mmol/L (ref 3.5–5.1)
Sodium: 140 mmol/L (ref 135–145)
TCO2: 28 mmol/L (ref 22–32)
pCO2 arterial: 53.6 mmHg — ABNORMAL HIGH (ref 32–48)
pH, Arterial: 7.306 — ABNORMAL LOW (ref 7.35–7.45)
pO2, Arterial: 108 mmHg (ref 83–108)

## 2021-10-08 LAB — ECHOCARDIOGRAM COMPLETE
Area-P 1/2: 2.8 cm2
Height: 69 in
S' Lateral: 2.8 cm
Weight: 2931.24 oz

## 2021-10-08 LAB — BASIC METABOLIC PANEL
Anion gap: 10 (ref 5–15)
BUN: 9 mg/dL (ref 6–20)
CO2: 24 mmol/L (ref 22–32)
Calcium: 8.8 mg/dL — ABNORMAL LOW (ref 8.9–10.3)
Chloride: 107 mmol/L (ref 98–111)
Creatinine, Ser: 1.11 mg/dL (ref 0.61–1.24)
GFR, Estimated: >60 mL/min (ref >60)
Glucose, Bld: 159 mg/dL — ABNORMAL HIGH (ref 70–99)
Potassium: 3.8 mmol/L (ref 3.5–5.1)
Sodium: 141 mmol/L (ref 135–145)

## 2021-10-08 LAB — HEMOGLOBIN A1C
Hgb A1c MFr Bld: 4.9 % (ref 4.8–5.6)
Mean Plasma Glucose: 93.93 mg/dL

## 2021-10-08 LAB — CBC
HCT: 41.6 % (ref 39.0–52.0)
Hemoglobin: 14.9 g/dL (ref 13.0–17.0)
MCH: 32.8 pg (ref 26.0–34.0)
MCHC: 35.8 g/dL (ref 30.0–36.0)
MCV: 91.6 fL (ref 80.0–100.0)
Platelets: 259 10*3/uL (ref 150–400)
RBC: 4.54 MIL/uL (ref 4.22–5.81)
RDW: 12.6 % (ref 11.5–15.5)
WBC: 8.9 10*3/uL (ref 4.0–10.5)
nRBC: 0 % (ref 0.0–0.2)

## 2021-10-08 LAB — GLUCOSE, CAPILLARY
Glucose-Capillary: 153 mg/dL — ABNORMAL HIGH (ref 70–99)
Glucose-Capillary: 146 mg/dL — ABNORMAL HIGH (ref 70–99)
Glucose-Capillary: 186 mg/dL — ABNORMAL HIGH (ref 70–99)
Glucose-Capillary: 175 mg/dL — ABNORMAL HIGH (ref 70–99)
Glucose-Capillary: 172 mg/dL — ABNORMAL HIGH (ref 70–99)
Glucose-Capillary: 210 mg/dL — ABNORMAL HIGH (ref 70–99)
Glucose-Capillary: 101 mg/dL — ABNORMAL HIGH (ref 70–99)

## 2021-10-08 LAB — MAGNESIUM: Magnesium: 2.3 mg/dL (ref 1.7–2.4)

## 2021-10-08 LAB — PHOSPHORUS: Phosphorus: 2.8 mg/dL (ref 2.5–4.6)

## 2021-10-08 LAB — MRSA NEXT GEN BY PCR, NASAL: MRSA by PCR Next Gen: NOT DETECTED

## 2021-10-08 MED ORDER — IPRATROPIUM-ALBUTEROL 0.5-2.5 (3) MG/3ML IN SOLN
3.0000 mL | RESPIRATORY_TRACT | Status: DC
Start: 2021-10-08 — End: 2021-10-08
  Administered 2021-10-08 (×4): 3 mL via RESPIRATORY_TRACT
  Filled 2021-10-08 (×4): qty 3

## 2021-10-08 MED ORDER — INSULIN ASPART 100 UNIT/ML IJ SOLN
0.0000 [IU] | INTRAMUSCULAR | Status: DC
  Administered 2021-10-08: 3 [IU] via SUBCUTANEOUS
  Administered 2021-10-08: 5 [IU] via SUBCUTANEOUS
  Administered 2021-10-08: 2 [IU] via SUBCUTANEOUS
  Administered 2021-10-08 (×3): 3 [IU] via SUBCUTANEOUS

## 2021-10-08 MED ORDER — PANTOPRAZOLE SODIUM 40 MG PO TBEC
40.0000 mg | DELAYED_RELEASE_TABLET | Freq: Every day | ORAL | Status: DC
  Administered 2021-10-08 – 2021-10-11 (×4): 40 mg via ORAL
  Filled 2021-10-08 (×4): qty 1

## 2021-10-08 MED ORDER — OLANZAPINE 5 MG PO TBDP
5.0000 mg | ORAL_TABLET | Freq: Two times a day (BID) | ORAL | Status: DC
  Administered 2021-10-08 – 2021-10-11 (×7): 5 mg via ORAL
  Filled 2021-10-08 (×8): qty 1

## 2021-10-08 MED ORDER — ALBUTEROL SULFATE (2.5 MG/3ML) 0.083% IN NEBU
2.5000 mg | INHALATION_SOLUTION | RESPIRATORY_TRACT | Status: DC | PRN
Start: 2021-10-08 — End: 2021-10-11

## 2021-10-08 MED ORDER — METHYLPREDNISOLONE SODIUM SUCC 40 MG IJ SOLR: 40.0000 mg | Freq: Every day | INTRAMUSCULAR | Status: DC

## 2021-10-08 MED ORDER — METHYLPREDNISOLONE SODIUM SUCC 125 MG IJ SOLR
90.0000 mg | Freq: Two times a day (BID) | INTRAMUSCULAR | Status: DC
  Administered 2021-10-08: 90 mg via INTRAVENOUS
  Filled 2021-10-08: qty 2

## 2021-10-08 MED ORDER — IPRATROPIUM-ALBUTEROL 0.5-2.5 (3) MG/3ML IN SOLN
3.0000 mL | Freq: Three times a day (TID) | RESPIRATORY_TRACT | Status: DC
Start: 2021-10-09 — End: 2021-10-09
  Administered 2021-10-09: 3 mL via RESPIRATORY_TRACT
  Filled 2021-10-08: qty 3

## 2021-10-08 MED ORDER — ORAL CARE MOUTH RINSE: 15.0000 mL | OROMUCOSAL | Status: DC | PRN

## 2021-10-08 MED ORDER — HEPARIN SODIUM (PORCINE) 5000 UNIT/ML IJ SOLN
5000.0000 [IU] | Freq: Three times a day (TID) | INTRAMUSCULAR | Status: DC
  Administered 2021-10-08 – 2021-10-11 (×10): 5000 [IU] via SUBCUTANEOUS
  Filled 2021-10-08 (×10): qty 1

## 2021-10-08 NOTE — Procedures (Signed)
Extubation Procedure Note  Patient Details:   Name: FORRESTER BLANDO DOB: 1970-09-10 MRN: [id number]   Airway Documentation:    Vent end date: 10/08/21 Vent end time: 0850   Evaluation  O2 sats: stable throughout Complications: No apparent complications Patient did tolerate procedure well. Bilateral Breath Sounds: Diminished   Patient extubated per MD order & placed on 4L [place]. Patient is alert/oriented & has good strong cough.   Jacqulynn Cadet 10/08/2021, 8:55 AM

## 2021-10-08 NOTE — Progress Notes (Signed)
eLink Physician-Brief Progress Note Patient Name: NORBERT MALKIN DOB: 02-03-1971 MRN: [id number]   Date of Service  10/08/2021  HPI/Events of Note  44M with asthma who presented with respiratory distress requiring intubation and admitted for asthma exacerbation. Transferred from [facility] to [facility]. En route reportedly low pressures requiring total 500cc IV fluid bolus  Patient on vent on sedation with propofol and fentanyl. SBP 90s PRVC FIO2 60% TV 420cc PEEP 5 RR 28  WBC 13.1 UDS + THC, UA neg  ABG post intubation 7.12/93/228 Repeat ABG 08/28/62/117   eICU Interventions  Continue full vent support, steroids, scheduled nebs Obtain 3rd IV in event low dose vasopressor needed for sedation needs SBT/WUA daily if qualified        Zaydon Kinser Mechele Collin 10/08/2021, 12:32 AM

## 2021-10-08 NOTE — Progress Notes (Signed)
*  PRELIMINARY RESULTS* Echocardiogram 2D Echocardiogram has been performed.  Wesley Beck 10/08/2021, 2:44 PM

## 2021-10-08 NOTE — Progress Notes (Signed)
NAME:  Wesley Beck, MRN:  [id number], DOB:  1970/07/29, LOS: 1 ADMISSION DATE:  10/07/2021, CONSULTATION DATE:  9/1 REFERRING MD:  Suezanne Jacquet, CHIEF COMPLAINT:  SOB   History of Present Illness:  Wesley Beck, "Wesley Beck" is a 51 y.o. male, who presented to the [facility] via EMS with a chief complaint of SOB.  Arrived hypoxic with RA sats 50% and unresponsive.  Patient was intubated in the [facility] ED. Tmax 98.7 WBC 13.1. Lactate WNL. Cxr with no obvious infiltrate, pneumo, effusion. CT head negative.  UDS +THC.  Transferred to [facility], PCCM admitted.    Pertinent  Medical History  asthma, Bipolar disorder 1, depression, schizophrenia  Significant Hospital Events: Including procedures, antibiotic start and stop dates in addition to other pertinent events   9/1 Presented to [facility] ED, CT head negative, ETT>, TXR to [facility]  Interim History / Subjective:  Wide awake on fent 50 mcg/min, f/c, weaning PSV 5/5.  No complaints.  No evidence of bronchospasm.   Objective   Blood pressure 103/73, pulse 65, temperature (!) 97 F (36.1 C), temperature source Axillary, resp. rate (!) 28, height 5\' 9"  (1.753 m), weight 83.1 kg, SpO2 99 %.    Vent Mode: PRVC FiO2 (%):  [30 %-100 %] 30 % Set Rate:  [18 bmp-28 bmp] 20 bmp Vt Set:  [350 mL-560 mL] 560 mL PEEP:  [5 cmH20] 5 cmH20 Plateau Pressure:  [11 cmH20-33 cmH20] 13 cmH20   Intake/Output Summary (Last 24 hours) at 10/08/2021 0840 Last data filed at 10/08/2021 0600 Gross per 24 hour  Intake 196.47 ml  Output 350 ml  Net -153.53 ml   Filed Weights   10/07/21 1852  Weight: 83.1 kg    Examination:  Fent 50 General:  Adult male lying in bed in NAD HEENT: MM pink/moist, pupils 3/reactive, ETT/ OGT Neuro:  Alert, f/c, MAE CV: rr, NSR PULM:  non labored, CTA, no wheeze, no evidence of bronchospasm on MV GI: soft, bs+, NT Extremities: warm/dry, no LE edema  Skin: no rashes    Labs/Imaging K 3.8, glucose 159, sCr 1.11, Mag 2.3, WBC wnl  Resolved Hospital  Problem list     Assessment & Plan:  Acute respiratory failure with hypoxia and hypercapnia Possible acute asthma exacerbation Active smoker, UDS + for THC Concern for PE, mildly +ddimer - extubate now - ? asthma exacerbation give rapid improvement and no evidence of bronchospasm today   - drop solumedrol to 40 daily and likely quickly taper - cont BD, prn BD> likely switch back to his home trelegy tomorrow.   - pending echo.  LE dopplers negative.  - ongoing smoking/ vaping THC cessation  - NPO x 4hrs, resume diet if no resp distress - ongoing aggressive pulm hygiene >> patient states his asthma is managed by his PCP but reports using albuterol 3-4 a day and is practically out of his trelegy.  Is open to seeing 12/07/21 in clinic ([place] would be best as he lives there and for compliance)  Also reports a lot of allergies> food/ cat> ? Eosinophilic component> was elevated on admit   Acute encephalopathy > unclear etiology, ? Substance abuse, resolved   HX Bipolar disorder 1, depression, schizophrenia - resume home zyprexa - patient denies any recent street illicit drug use, uses THC vape from a store.     Hyperglycemia  2/2 to Steroids.  A1c 4.9 -Blood Glucose goal 140-180. -SSI  ?HTN - coreg listed as home med.  Currently not needed.  Continue to monitor  - pending echo  Best Practice (right click and "Reselect all SmartList Selections" daily)   Diet/type: NPO> advance as tolerated after 4hrs DVT prophylaxis: prophylactic heparin  GI prophylaxis: PPI Lines: N/A Foley:  Yes, and it is no longer needed and removal ordered  Code Status:  full code Last date of multidisciplinary goals of care discussion [Pending]  Labs   CBC: Recent Labs  Lab 10/07/21 1800 10/08/21 0037 10/08/21 0122  WBC 13.1*  --  8.9  NEUTROABS 6.8  --   --   HGB 16.0 13.6 14.9  HCT 46.7 40.0 41.6  MCV 93.4  --  91.6  PLT 398  --  259    Basic Metabolic Panel: Recent Labs  Lab 10/07/21 1800  10/08/21 0037 10/08/21 0122  NA 139 140 141  K 3.7 3.5 3.8  CL 104  --  107  CO2 30  --  24  GLUCOSE 152*  --  159*  BUN 10  --  9  CREATININE 1.03  --  1.11  CALCIUM 9.1  --  8.8*  MG  --   --  2.3  PHOS  --   --  2.8   GFR: Estimated Creatinine Clearance: 80.5 mL/min (by C-G formula based on SCr of 1.11 mg/dL). Recent Labs  Lab 10/07/21 1800 10/07/21 1856 10/07/21 1945 10/08/21 0122  WBC 13.1*  --   --  8.9  LATICACIDVEN  --  0.8 0.9  --     Liver Function Tests: Recent Labs  Lab 10/07/21 1800  AST 36  ALT 26  ALKPHOS 97  BILITOT 1.5*  PROT 7.6  ALBUMIN 4.9   No results for input(s): "LIPASE", "AMYLASE" in the last 168 hours. No results for input(s): "AMMONIA" in the last 168 hours.  ABG    Component Value Date/Time   PHART 7.306 (L) 10/08/2021 0037   PCO2ART 53.6 (H) 10/08/2021 0037   PO2ART 108 10/08/2021 0037   HCO3 26.7 10/08/2021 0037   TCO2 28 10/08/2021 0037   ACIDBASEDEF 2.5 (H) 10/07/2021 2054   O2SAT 98 10/08/2021 0037     Coagulation Profile: Recent Labs  Lab 10/07/21 1800  INR 1.2    Cardiac Enzymes: No results for input(s): "CKTOTAL", "CKMB", "CKMBINDEX", "TROPONINI" in the last 168 hours.  HbA1C: Hgb A1c MFr Bld  Date/Time Value Ref Range Status  10/08/2021 01:22 AM 4.9 4.8 - 5.6 % Final    Comment:    (NOTE) Pre diabetes:          5.7%-6.4%  Diabetes:              >6.4%  Glycemic control for   <7.0% adults with diabetes     CBG: Recent Labs  Lab 10/07/21 1751 10/08/21 0028 10/08/21 0347  GLUCAP 154* 153* 146*     Critical care time: 35 minutes     Posey Boyer, MSN, AG-ACNP-BC [facility] 10/08/2021, 8:40 AM  See Amion for pager If no response to pager, please call PCCM consult pager After 7:00 pm call Elink

## 2021-10-08 NOTE — Progress Notes (Signed)
VASCULAR LAB    Bilateral lower extremity venous duplex has been performed.  See CV proc for preliminary results.   Kerney Hopfensperger, RVT 10/08/2021, 8:45 AM

## 2021-10-09 ENCOUNTER — Telehealth: Payer: Self-pay | Admitting: Nurse Practitioner

## 2021-10-09 DIAGNOSIS — R0902 Hypoxemia: Secondary | ICD-10-CM | POA: Diagnosis not present

## 2021-10-09 DIAGNOSIS — J4551 Severe persistent asthma with (acute) exacerbation: Secondary | ICD-10-CM | POA: Diagnosis not present

## 2021-10-09 DIAGNOSIS — J9602 Acute respiratory failure with hypercapnia: Secondary | ICD-10-CM | POA: Diagnosis not present

## 2021-10-09 DIAGNOSIS — J9601 Acute respiratory failure with hypoxia: Secondary | ICD-10-CM | POA: Diagnosis not present

## 2021-10-09 LAB — CBC
HCT: 35.6 % — ABNORMAL LOW (ref 39.0–52.0)
Hemoglobin: 12.5 g/dL — ABNORMAL LOW (ref 13.0–17.0)
MCH: 32.2 pg (ref 26.0–34.0)
MCHC: 35.1 g/dL (ref 30.0–36.0)
MCV: 91.8 fL (ref 80.0–100.0)
Platelets: 234 10*3/uL (ref 150–400)
RBC: 3.88 MIL/uL — ABNORMAL LOW (ref 4.22–5.81)
RDW: 12.8 % (ref 11.5–15.5)
WBC: 11.3 10*3/uL — ABNORMAL HIGH (ref 4.0–10.5)
nRBC: 0 % (ref 0.0–0.2)

## 2021-10-09 LAB — BASIC METABOLIC PANEL
Anion gap: 7 (ref 5–15)
BUN: 11 mg/dL (ref 6–20)
CO2: 26 mmol/L (ref 22–32)
Calcium: 8.9 mg/dL (ref 8.9–10.3)
Chloride: 109 mmol/L (ref 98–111)
Creatinine, Ser: 0.85 mg/dL (ref 0.61–1.24)
GFR, Estimated: >60 mL/min (ref >60)
Glucose, Bld: 108 mg/dL — ABNORMAL HIGH (ref 70–99)
Potassium: 3.5 mmol/L (ref 3.5–5.1)
Sodium: 142 mmol/L (ref 135–145)

## 2021-10-09 LAB — GLUCOSE, CAPILLARY
Glucose-Capillary: 108 mg/dL — ABNORMAL HIGH (ref 70–99)
Glucose-Capillary: 102 mg/dL — ABNORMAL HIGH (ref 70–99)
Glucose-Capillary: 107 mg/dL — ABNORMAL HIGH (ref 70–99)
Glucose-Capillary: 127 mg/dL — ABNORMAL HIGH (ref 70–99)

## 2021-10-09 LAB — MAGNESIUM: Magnesium: 2.1 mg/dL (ref 1.7–2.4)

## 2021-10-09 MED ORDER — BUDESONIDE 0.5 MG/2ML IN SUSP
0.5000 mg | Freq: Two times a day (BID) | RESPIRATORY_TRACT | Status: DC
  Administered 2021-10-09 – 2021-10-11 (×5): 0.5 mg via RESPIRATORY_TRACT
  Filled 2021-10-09 (×5): qty 2

## 2021-10-09 MED ORDER — PREDNISONE 10 MG PO TABS: 10.0000 mg | ORAL_TABLET | Freq: Every day | ORAL | Status: DC

## 2021-10-09 MED ORDER — IPRATROPIUM-ALBUTEROL 0.5-2.5 (3) MG/3ML IN SOLN
3.0000 mL | RESPIRATORY_TRACT | Status: DC
  Administered 2021-10-09 – 2021-10-10 (×5): 3 mL via RESPIRATORY_TRACT
  Filled 2021-10-09 (×6): qty 3

## 2021-10-09 MED ORDER — PREDNISONE 20 MG PO TABS: 20.0000 mg | ORAL_TABLET | Freq: Every day | ORAL | Status: DC

## 2021-10-09 MED ORDER — POTASSIUM CHLORIDE 20 MEQ PO PACK
40.0000 meq | PACK | Freq: Once | ORAL | Status: AC
  Administered 2021-10-09: 40 meq via ORAL
  Filled 2021-10-09: qty 2

## 2021-10-09 MED ORDER — PREDNISONE 20 MG PO TABS
30.0000 mg | ORAL_TABLET | Freq: Every day | ORAL | Status: DC
  Administered 2021-10-10 – 2021-10-11 (×2): 30 mg via ORAL
  Filled 2021-10-09 (×2): qty 1

## 2021-10-09 MED ORDER — ARFORMOTEROL TARTRATE 15 MCG/2ML IN NEBU
15.0000 ug | INHALATION_SOLUTION | Freq: Two times a day (BID) | RESPIRATORY_TRACT | Status: DC
  Administered 2021-10-09 – 2021-10-11 (×4): 15 ug via RESPIRATORY_TRACT
  Filled 2021-10-09 (×6): qty 2

## 2021-10-09 MED ORDER — METHYLPREDNISOLONE SODIUM SUCC 40 MG IJ SOLR
40.0000 mg | Freq: Every day | INTRAMUSCULAR | Status: AC
  Administered 2021-10-09: 40 mg via INTRAVENOUS
  Filled 2021-10-09: qty 1

## 2021-10-09 NOTE — Progress Notes (Addendum)
NAME:  Wesley Beck, MRN:  [id number], DOB:  [birth year], LOS: 2 ADMISSION DATE:  10/07/2021, CONSULTATION DATE:  9/1 REFERRING MD:  Suezanne Jacquet, CHIEF COMPLAINT:  SOB   History of Present Illness:  Wesley Beck, "Wesley Beck" is a 51 y.o. male, who presented to [facility] via EMS with a chief complaint of SOB.  Arrived hypoxic with RA sats 50% and unresponsive.  Patient was intubated in the [facility] ED. Tmax 98.7 WBC 13.1. Lactate WNL. Cxr with no obvious infiltrate, pneumo, effusion. CT head negative.  UDS +THC.  Transferred to [facility], PCCM admitted.    Pertinent  Medical History  asthma, Bipolar disorder 1, depression, schizophrenia  Significant Hospital Events: Including procedures, antibiotic start and stop dates in addition to other pertinent events   9/1 Presented to [facility] ED, CT head negative, ETT>, TXR to [facility] 9/2 extubated  Interim History / Subjective:  No events overnight or complaints.  Slept well.  Denies any SOB/ CP  Objective   Blood pressure (!) 88/55, pulse 70, temperature 98.1 F (36.7 C), temperature source Oral, resp. rate 19, height 5\' 9"  (1.753 m), weight 83.1 kg, SpO2 91 %.        Intake/Output Summary (Last 24 hours) at 10/09/2021 0754 Last data filed at 10/08/2021 1700 Gross per 24 hour  Intake 12.42 ml  Output 1600 ml  Net -1587.58 ml   Filed Weights   10/07/21 1852  Weight: 83.1 kg    Examination:  General:  adult male sitting upright in bed in NAD HEENT: MM pink/moist Neuro: Aox3, MAE CV: rr, NSR PULM:  non labored, speaking full sentences, insp/ exp wheeze throughout, decent air flow, mild congested cough> np thus far GI: soft, bs+, NT Extremities: warm/dry, no LE edema  Skin: no rashes  Labs/Imaging Pending labs  Remains afebrile  Voiding well  Eating well   Resolved Hospital Problem list     Assessment & Plan:  Acute respiratory failure with hypoxia and hypercapnia Likely 2/2 acute asthma exacerbation, ? Eosinophilic component Active smoker, UDS  + for [facility] Concern for PE, mildly +ddimer - extubated 9/2 - wheezing today, continue duonebs and prn albuterol> working with pharmacy to see what inhaler we can send him out on> has been on trelegy but most recent rx for wixela  - change to prednisone taper  - echo > EF 60-65%, no RWA, normal RV, no valvular issues.  LE dopplers neg>  - pending echo.  LE dopplers negative. > less likely PE  - ongoing smoking/ vaping THC cessation  - ongoing aggressive pulm hygiene - ambulatory walk test prior to discharge which will likely be later this morning.   - will get patient set up in our [place] clinic for hospital f/u.    Addendum> patient ambulated around unit on RA and dropped to 88%.  At rest is 89-92%, still has diffuse wheezing.  Not ready for d/c home.  Will add brovanna/ pulmicort and increase duonebs.  Continue steroids.   Will tx out of ICU and sign out for [facility] for pickup 9/4.   Pharmacy verified that patient has trelegy inhaler rx ready for pickup at no copay for discharge.    Acute encephalopathy > unclear etiology, ? Substance abuse, resolved   HX Bipolar disorder 1, depression, schizophrenia - resume home zyprexa - patient denies any recent street illicit drug use, uses THC vape from a store.     Hyperglycemia  2/2 to Steroids.  A1c 4.9 -Blood Glucose goal 140-180. -d/c  SSI   ?HTN - coreg listed as home med.  Has been held here, not needed.  SBP into the 80's overnight while sleeping, currently SBP 120s.  Continue to hold.   - echo as above  Best Practice (right click and "Reselect all SmartList Selections" daily)   Diet/type: Regular consistency (see orders) DVT prophylaxis: prophylactic heparin  GI prophylaxis: N/A Lines: N/A Foley:  N/A- voiding well  Code Status:  full code Last date of multidisciplinary goals of care discussion [9/3  Labs   CBC: Recent Labs  Lab 10/07/21 1800 10/08/21 0037 10/08/21 0122  WBC 13.1*  --  8.9  NEUTROABS 6.8  --   --   HGB 16.0  13.6 14.9  HCT 46.7 40.0 41.6  MCV 93.4  --  91.6  PLT 398  --  259    Basic Metabolic Panel: Recent Labs  Lab 10/07/21 1800 10/08/21 0037 10/08/21 0122  NA 139 140 141  K 3.7 3.5 3.8  CL 104  --  107  CO2 30  --  24  GLUCOSE 152*  --  159*  BUN 10  --  9  CREATININE 1.03  --  1.11  CALCIUM 9.1  --  8.8*  MG  --   --  2.3  PHOS  --   --  2.8   GFR: Estimated Creatinine Clearance: 80.5 mL/min (by C-G formula based on SCr of 1.11 mg/dL). Recent Labs  Lab 10/07/21 1800 10/07/21 1856 10/07/21 1945 10/08/21 0122  WBC 13.1*  --   --  8.9  LATICACIDVEN  --  0.8 0.9  --     Liver Function Tests: Recent Labs  Lab 10/07/21 1800  AST 36  ALT 26  ALKPHOS 97  BILITOT 1.5*  PROT 7.6  ALBUMIN 4.9   No results for input(s): "LIPASE", "AMYLASE" in the last 168 hours. No results for input(s): "AMMONIA" in the last 168 hours.  ABG    Component Value Date/Time   PHART 7.306 (L) 10/08/2021 0037   PCO2ART 53.6 (H) 10/08/2021 0037   PO2ART 108 10/08/2021 0037   HCO3 26.7 10/08/2021 0037   TCO2 28 10/08/2021 0037   ACIDBASEDEF 2.5 (H) 10/07/2021 2054   O2SAT 98 10/08/2021 0037     Coagulation Profile: Recent Labs  Lab 10/07/21 1800  INR 1.2    Cardiac Enzymes: No results for input(s): "CKTOTAL", "CKMB", "CKMBINDEX", "TROPONINI" in the last 168 hours.  HbA1C: Hgb A1c MFr Bld  Date/Time Value Ref Range Status  10/08/2021 01:22 AM 4.9 4.8 - 5.6 % Final    Comment:    (NOTE) Pre diabetes:          5.7%-6.4%  Diabetes:              >6.4%  Glycemic control for   <7.0% adults with diabetes     CBG: Recent Labs  Lab 10/08/21 1116 10/08/21 1523 10/08/21 1904 10/08/21 2323 10/09/21 0312  GLUCAP 175* 172* 210* 101* 108*     Critical care time: n/a     Posey Boyer, MSN, AG-ACNP-BC [facility] 10/09/2021, 7:54 AM  See Amion for pager If no response to pager, please call PCCM consult pager After 7:00 pm call Elink

## 2021-10-09 NOTE — Plan of Care (Signed)

## 2021-10-10 ENCOUNTER — Inpatient Hospital Stay (HOSPITAL_COMMUNITY): Payer: Medicare Other

## 2021-10-10 DIAGNOSIS — R0902 Hypoxemia: Secondary | ICD-10-CM | POA: Diagnosis not present

## 2021-10-10 DIAGNOSIS — J9601 Acute respiratory failure with hypoxia: Secondary | ICD-10-CM | POA: Diagnosis not present

## 2021-10-10 DIAGNOSIS — J4551 Severe persistent asthma with (acute) exacerbation: Secondary | ICD-10-CM | POA: Diagnosis not present

## 2021-10-10 DIAGNOSIS — J9602 Acute respiratory failure with hypercapnia: Secondary | ICD-10-CM | POA: Diagnosis not present

## 2021-10-10 LAB — GLUCOSE, CAPILLARY
Glucose-Capillary: 100 mg/dL — ABNORMAL HIGH (ref 70–99)
Glucose-Capillary: 108 mg/dL — ABNORMAL HIGH (ref 70–99)
Glucose-Capillary: 138 mg/dL — ABNORMAL HIGH (ref 70–99)
Glucose-Capillary: 141 mg/dL — ABNORMAL HIGH (ref 70–99)
Glucose-Capillary: 181 mg/dL — ABNORMAL HIGH (ref 70–99)

## 2021-10-10 MED ORDER — MONTELUKAST SODIUM 10 MG PO TABS
10.0000 mg | ORAL_TABLET | Freq: Every day | ORAL | Status: DC
  Administered 2021-10-10: 10 mg via ORAL
  Filled 2021-10-10: qty 1

## 2021-10-10 MED ORDER — IOHEXOL 350 MG/ML SOLN
75.0000 mL | Freq: Once | INTRAVENOUS | Status: AC | PRN
  Administered 2021-10-10: 75 mL via INTRAVENOUS

## 2021-10-10 MED ORDER — SALINE SPRAY 0.65 % NA SOLN
1.0000 | NASAL | Status: DC | PRN
Start: 2021-10-10 — End: 2021-10-11

## 2021-10-10 MED ORDER — GUAIFENESIN ER 600 MG PO TB12
600.0000 mg | ORAL_TABLET | Freq: Two times a day (BID) | ORAL | Status: DC
  Administered 2021-10-10 – 2021-10-11 (×3): 600 mg via ORAL
  Filled 2021-10-10 (×3): qty 1

## 2021-10-10 MED ORDER — LORATADINE 10 MG PO TABS
10.0000 mg | ORAL_TABLET | Freq: Every day | ORAL | Status: DC
  Administered 2021-10-10 – 2021-10-11 (×2): 10 mg via ORAL
  Filled 2021-10-10 (×2): qty 1

## 2021-10-10 MED ORDER — LEVOFLOXACIN 750 MG PO TABS
750.0000 mg | ORAL_TABLET | Freq: Every day | ORAL | Status: DC
Start: 2021-10-10 — End: 2021-10-11
  Administered 2021-10-10: 750 mg via ORAL
  Filled 2021-10-10: qty 1

## 2021-10-10 MED ORDER — FLUTICASONE PROPIONATE 50 MCG/ACT NA SUSP
2.0000 | Freq: Every day | NASAL | Status: DC
  Administered 2021-10-10 – 2021-10-11 (×2): 2 via NASAL
  Filled 2021-10-10: qty 16

## 2021-10-10 MED ORDER — POTASSIUM CHLORIDE CRYS ER 20 MEQ PO TBCR
40.0000 meq | EXTENDED_RELEASE_TABLET | Freq: Once | ORAL | Status: AC
  Administered 2021-10-10: 40 meq via ORAL
  Filled 2021-10-10: qty 2

## 2021-10-10 MED ORDER — FUROSEMIDE 10 MG/ML IJ SOLN
20.0000 mg | Freq: Once | INTRAMUSCULAR | Status: AC
  Administered 2021-10-10: 20 mg via INTRAVENOUS
  Filled 2021-10-10: qty 2

## 2021-10-10 MED ORDER — IPRATROPIUM-ALBUTEROL 0.5-2.5 (3) MG/3ML IN SOLN
3.0000 mL | Freq: Three times a day (TID) | RESPIRATORY_TRACT | Status: DC
Start: 2021-10-10 — End: 2021-10-11
  Administered 2021-10-10 – 2021-10-11 (×3): 3 mL via RESPIRATORY_TRACT
  Filled 2021-10-10 (×3): qty 3

## 2021-10-10 NOTE — Plan of Care (Signed)
  Problem: Clinical Measurements: Goal: Respiratory complications will improve Outcome: Progressing Goal: Cardiovascular complication will be avoided Outcome: Progressing   Problem: Activity: Goal: Risk for activity intolerance will decrease Outcome: Progressing   Problem: Nutrition: Goal: Adequate nutrition will be maintained Outcome: Progressing   Problem: Coping: Goal: Level of anxiety will decrease Outcome: Progressing   Problem: Elimination: Goal: Will not experience complications related to urinary retention Outcome: Progressing   

## 2021-10-10 NOTE — Progress Notes (Signed)
NAME:  Wesley Beck, MRN:  [id number], DOB:  [birth year], LOS: 3 ADMISSION DATE:  10/07/2021, CONSULTATION DATE:  9/1 REFERRING MD:  Suezanne Jacquet, CHIEF COMPLAINT:  SOB   History of Present Illness:  Wesley Beck, "Wesley Beck" is a 51 y.o. male, who presented to [facility] via EMS with a chief complaint of SOB.  Arrived hypoxic with RA sats 50% and unresponsive.  Patient was intubated in the [facility] ED. Tmax 98.7 WBC 13.1. Lactate WNL. Cxr with no obvious infiltrate, pneumo, effusion. CT head negative.  UDS +THC.  Transferred to [facility], PCCM admitted.    Pertinent  Medical History  asthma, Bipolar disorder 1, depression, schizophrenia  Significant Hospital Events: Including procedures, antibiotic start and stop dates in addition to other pertinent events   9/1 Presented to [facility] ED, CT head negative, ETT>, TXR to [facility] 9/2 extubated 9/3 increased wheezing, intermittent O2 for sats 88-94%  Interim History / Subjective:  Some nasal congestion and feels wheezy this morning but no significant SOB.  Resting O2 sats 89%  Objective   Blood pressure 124/69, pulse 84, temperature 98 F (36.7 C), temperature source Oral, resp. rate 20, height 5\' 9"  (1.753 m), weight 83.1 kg, SpO2 95 %.        Intake/Output Summary (Last 24 hours) at 10/10/2021 1036 Last data filed at 10/09/2021 1500 Gross per 24 hour  Intake 240 ml  Output 300 ml  Net -60 ml   Filed Weights   10/07/21 1852  Weight: 83.1 kg    Examination:  General:  adult male lying in bed in NAD HEENT: MM pink/moist, some nasal congestion Neuro: Aox3, MAE CV: rr PULM:  non labored/ normal WOB, diffuse insp/ exp wheeze, denies any productive cough GI: soft, bs+ Extremities: warm/dry, no edema   Resolved Hospital Problem list     Assessment & Plan:  Acute respiratory failure with hypoxia and hypercapnia Likely 2/2 acute asthma exacerbation, ? Eosinophilic component Active smoker, UDS + for [facility] Concern for PE, mildly +ddimer - extubated  9/2 - Still wheezing today, poor historian to say if he feels near his baseline.  Denies any chest pain or tightness.   - cont prednisone - cont scheduled duonebs, brovanna/ pulmicort, and prn albuterol - will add flonase, guaifenesin, claritin, singular, and nasal spray  - discussed with attending.  Echo and LE dopplers were non concerning but given his saturations continue to be low intermittently and d-dimer 0.75, will do CT chest to rule out PE for completeness.  - ongoing smoking/ THC cessation  - message already sent for office to call/ arrange hospital followup hopefully for [facility] clinic.  Patient has trelegy inhaler ready for pickup at pharmacy at discharge.   Pulmonary will continue to follow, remainder per [facility].    Best Practice (right click and "Reselect all SmartList Selections" daily)   Diet/type: Regular consistency (see orders) DVT prophylaxis: prophylactic heparin  GI prophylaxis: N/A Lines: N/A Foley:  N/A- voiding well  Code Status:  full code Last date of multidisciplinary goals of care discussion [9/3  Labs   CBC: Recent Labs  Lab 10/07/21 1800 10/08/21 0037 10/08/21 0122 10/09/21 0736  WBC 13.1*  --  8.9 11.3*  NEUTROABS 6.8  --   --   --   HGB 16.0 13.6 14.9 12.5*  HCT 46.7 40.0 41.6 35.6*  MCV 93.4  --  91.6 91.8  PLT 398  --  259 234    Basic Metabolic Panel: Recent Labs  Lab 10/07/21  1800 10/08/21 0037 10/08/21 0122 10/09/21 0736  NA 139 140 141 142  K 3.7 3.5 3.8 3.5  CL 104  --  107 109  CO2 30  --  24 26  GLUCOSE 152*  --  159* 108*  BUN 10  --  9 11  CREATININE 1.03  --  1.11 0.85  CALCIUM 9.1  --  8.8* 8.9  MG  --   --  2.3 2.1  PHOS  --   --  2.8  --    GFR: Estimated Creatinine Clearance: 105.1 mL/min (by C-G formula based on SCr of 0.85 mg/dL). Recent Labs  Lab 10/07/21 1800 10/07/21 1856 10/07/21 1945 10/08/21 0122 10/09/21 0736  WBC 13.1*  --   --  8.9 11.3*  LATICACIDVEN  --  0.8 0.9  --   --     Liver Function  Tests: Recent Labs  Lab 10/07/21 1800  AST 36  ALT 26  ALKPHOS 97  BILITOT 1.5*  PROT 7.6  ALBUMIN 4.9   No results for input(s): "LIPASE", "AMYLASE" in the last 168 hours. No results for input(s): "AMMONIA" in the last 168 hours.  ABG    Component Value Date/Time   PHART 7.306 (L) 10/08/2021 0037   PCO2ART 53.6 (H) 10/08/2021 0037   PO2ART 108 10/08/2021 0037   HCO3 26.7 10/08/2021 0037   TCO2 28 10/08/2021 0037   ACIDBASEDEF 2.5 (H) 10/07/2021 2054   O2SAT 98 10/08/2021 0037     Coagulation Profile: Recent Labs  Lab 10/07/21 1800  INR 1.2    Cardiac Enzymes: No results for input(s): "CKTOTAL", "CKMB", "CKMBINDEX", "TROPONINI" in the last 168 hours.  HbA1C: Hgb A1c MFr Bld  Date/Time Value Ref Range Status  10/08/2021 01:22 AM 4.9 4.8 - 5.6 % Final    Comment:    (NOTE) Pre diabetes:          5.7%-6.4%  Diabetes:              >6.4%  Glycemic control for   <7.0% adults with diabetes     CBG: Recent Labs  Lab 10/09/21 1124 10/09/21 2013 10/10/21 0015 10/10/21 0356 10/10/21 0757  GLUCAP 107* 127* 141* 108* 100*     Critical care time: n/a     Posey Boyer, MSN, AG-ACNP-BC [place] [facility] 10/10/2021, 10:36 AM  See Amion for pager If no response to pager, please call PCCM consult pager After 7:00 pm call Elink

## 2021-10-10 NOTE — Care Management Important Message (Signed)
Important Message  Patient Details  Name: JQUAN EGELSTON MRN: [id number] Date of Birth: 09-27-1970   Medicare Important Message Given:  Yes     Kery Batzel 10/10/2021, 12:09 PM

## 2021-10-10 NOTE — Progress Notes (Signed)
PROGRESS NOTE                                                                                                                                                                                                             Patient Demographics:    Wesley Beck, is a 51 y.o. male, DOB - 05/28/1970, YSA:[id number]  Outpatient Primary MD for the patient is Benetta Spar, MD    LOS - 3  Admit date - 10/07/2021    Chief Complaint  Patient presents with   Shortness of Breath       Brief Narrative (HPI from H&P)   Wesley Beck, "Wesley Beck" is a 51 y.o. male, who presented to the [facility] via EMS with a chief complaint of SOB.  Arrived hypoxic with RA sats 50% and unresponsive.  Patient was intubated in the [facility] ED. Tmax 98.7 WBC 13.1. Lactate WNL. Cxr with no obvious infiltrate, pneumo, effusion. CT head negative.  UDS +THC.  Transferred to [facility], PCCM admitted.  He was diagnosed with asthma exacerbation he was extubated on 10/09/2021, transferred to my service on 10/10/2021 on room air.  9/1 Presented to [facility] ED, CT head negative, ETT>, TXR to [facility] 9/2 extubated   Subjective:    Wesley Beck today has, No headache, No chest pain, No abdominal pain - No Nausea, No new weakness tingling or numbness, no SOB   Assessment  & Plan :    Acute hypoxic and hypercapnic respiratory failure due to asthma exacerbation in a patient with active smoker and uses marijuana as well. He was admitted by PCCM briefly intubated for 24 hours, extubated on 10/09/2021, now on room air but mild wheezing, has been treated with steroids and now transition to oral steroids, continue breathing treatments, steroid taper, advance activity, add I-S and flutter valve.  Counseled to quit smoking and THC.  He does have some crackles on exam on 10/10/2021 and mild wheezing for which I will give him a trial of low-dose Lasix.  Likely discharge on 10/11/2021.        Condition  - Fair  Family Communication  :  None  Code Status :  Full  Consults  :  PCCM  PUD Prophylaxis : PPI   Procedures  :            Disposition Plan  :  Status is: Inpatient   DVT Prophylaxis  :    heparin injection 5,000 Units Start: 10/08/21 0600    Lab Results  Component Value Date   PLT 234 10/09/2021    Diet :  Diet Order             Diet regular Room service appropriate? Yes; Fluid consistency: Thin  Diet effective now                    Inpatient Medications  Scheduled Meds:  arformoterol  15 mcg Nebulization BID   budesonide (PULMICORT) nebulizer solution  0.5 mg Nebulization BID   fluticasone  2 spray Each Nare Daily   furosemide  20 mg Intravenous Once   guaiFENesin  600 mg Oral BID   heparin injection (subcutaneous)  5,000 Units Subcutaneous Q8H   ipratropium-albuterol  3 mL Nebulization TID   loratadine  10 mg Oral Daily   montelukast  10 mg Oral QHS   OLANZapine zydis  5 mg Oral BID   pantoprazole  40 mg Oral Daily   predniSONE  30 mg Oral Q breakfast   Followed by   Melene Muller ON 10/13/2021] predniSONE  20 mg Oral Q breakfast   Followed by   Melene Muller ON 10/15/2021] predniSONE  10 mg Oral Q breakfast   Continuous Infusions: PRN Meds:.albuterol, mouth rinse, sodium chloride  Antibiotics  :    Anti-infectives (From admission, onward)    None        Time Spent in minutes  30   Susa Raring M.D on 10/10/2021 at 11:37 AM  To page go to [email or web address]   [facility] -  Office  [phone]  See all Orders from today for further details    Objective:   Vitals:   10/10/21 0400 10/10/21 0416 10/10/21 0742 10/10/21 0832  BP: 105/68   124/69  Pulse: 74   84  Resp: 16   20  Temp: (!) 97.5 F (36.4 C)   98 F (36.7 C)  TempSrc: Oral   Oral  SpO2: 93% 93% 95% 95%  Weight:      Height:        Wt Readings from Last 3 Encounters:  10/07/21 83.1 kg  03/04/21 90.7 kg  07/01/19 99.8 kg     Intake/Output Summary (Last 24  hours) at 10/10/2021 1137 Last data filed at 10/09/2021 1500 Gross per 24 hour  Intake 240 ml  Output 300 ml  Net -60 ml     Physical Exam  Awake Alert, No new F.N deficits, Normal affect [place].AT,PERRAL Supple Neck, No JVD,   Symmetrical Chest wall movement, Good air movement bilaterally, few rales and mild wheezing RRR,No Gallops,Rubs or new Murmurs,  +ve B.Sounds, Abd Soft, No tenderness,   No Cyanosis, Clubbing or edema       Data Review:    CBC Recent Labs  Lab 10/07/21 1800 10/08/21 0037 10/08/21 0122 10/09/21 0736  WBC 13.1*  --  8.9 11.3*  HGB 16.0 13.6 14.9 12.5*  HCT 46.7 40.0 41.6 35.6*  PLT 398  --  259 234  MCV 93.4  --  91.6 91.8  MCH 32.0  --  32.8 32.2  MCHC 34.3  --  35.8 35.1  RDW 12.6  --  12.6 12.8  LYMPHSABS 3.3  --   --   --   MONOABS 1.7*  --   --   --   EOSABS 1.1*  --   --   --  BASOSABS 0.2*  --   --   --     Electrolytes Recent Labs  Lab 10/07/21 1800 10/07/21 1856 10/07/21 1945 10/08/21 0037 10/08/21 0122 10/09/21 0736  NA 139  --   --  140 141 142  K 3.7  --   --  3.5 3.8 3.5  CL 104  --   --   --  107 109  CO2 30  --   --   --  24 26  GLUCOSE 152*  --   --   --  159* 108*  BUN 10  --   --   --  9 11  CREATININE 1.03  --   --   --  1.11 0.85  CALCIUM 9.1  --   --   --  8.8* 8.9  AST 36  --   --   --   --   --   ALT 26  --   --   --   --   --   ALKPHOS 97  --   --   --   --   --   BILITOT 1.5*  --   --   --   --   --   ALBUMIN 4.9  --   --   --   --   --   MG  --   --   --   --  2.3 2.1  DDIMER 0.75*  --   --   --   --   --   LATICACIDVEN  --  0.8 0.9  --   --   --   INR 1.2  --   --   --   --   --   HGBA1C  --   --   --   --  4.9  --   BNP 24.0  --   --   --   --   --     ------------------------------------------------------------------------------------------------------------------ No results for input(s): "CHOL", "HDL", "LDLCALC", "TRIG", "CHOLHDL", "LDLDIRECT" in the last 72 hours.  Lab Results  Component Value  Date   HGBA1C 4.9 10/08/2021    No results for input(s): "TSH", "T4TOTAL", "T3FREE", "THYROIDAB" in the last 72 hours.  Invalid input(s): "FREET3" ------------------------------------------------------------------------------------------------------------------ ID Labs Recent Labs  Lab 10/07/21 1800 10/07/21 1856 10/07/21 1945 10/08/21 0122 10/09/21 0736  WBC 13.1*  --   --  8.9 11.3*  PLT 398  --   --  259 234  DDIMER 0.75*  --   --   --   --   LATICACIDVEN  --  0.8 0.9  --   --   CREATININE 1.03  --   --  1.11 0.85   Cardiac Enzymes No results for input(s): "CKMB", "TROPONINI", "MYOGLOBIN" in the last 168 hours.  Invalid input(s): "CK"       Micro Results Recent Results (from the past 240 hour(s))  Resp Panel by RT-PCR (Flu A&B, Covid) Anterior Nasal Swab     Status: None   Collection Time: 10/07/21  6:12 PM   Specimen: Anterior Nasal Swab  Result Value Ref Range Status   SARS Coronavirus 2 by RT PCR NEGATIVE NEGATIVE Final    Comment: (NOTE) SARS-CoV-2 target nucleic acids are NOT DETECTED.  The SARS-CoV-2 RNA is generally detectable in upper respiratory specimens during the acute phase of infection. The lowest concentration of SARS-CoV-2 viral copies this assay can detect is 138 copies/mL. A negative result does not preclude SARS-Cov-2 infection and should not  be used as the sole basis for treatment or other patient management decisions. A negative result may occur with  improper specimen collection/handling, submission of specimen other than nasopharyngeal swab, presence of viral mutation(s) within the areas targeted by this assay, and inadequate number of viral copies(<138 copies/mL). A negative result must be combined with clinical observations, patient history, and epidemiological information. The expected result is Negative.  Fact Sheet for Patients:  EntrepreneurPulse.com.au  Fact Sheet for Healthcare Providers:   IncredibleEmployment.be  This test is no t yet approved or cleared by the [place] FDA and  has been authorized for detection and/or diagnosis of SARS-CoV-2 by FDA under an Emergency Use Authorization (EUA). This EUA will remain  in effect (meaning this test can be used) for the duration of the COVID-19 declaration under Section 564(b)(1) of the Act, 21 U.S.C.section 360bbb-3(b)(1), unless the authorization is terminated  or revoked sooner.       Influenza A by PCR NEGATIVE NEGATIVE Final   Influenza B by PCR NEGATIVE NEGATIVE Final    Comment: (NOTE) The Xpert Xpress SARS-CoV-2/FLU/RSV plus assay is intended as an aid in the diagnosis of influenza from Nasopharyngeal swab specimens and should not be used as a sole basis for treatment. Nasal washings and aspirates are unacceptable for Xpert Xpress SARS-CoV-2/FLU/RSV testing.  Fact Sheet for Patients: EntrepreneurPulse.com.au  Fact Sheet for Healthcare Providers: IncredibleEmployment.be  This test is not yet approved or cleared by the [place] FDA and has been authorized for detection and/or diagnosis of SARS-CoV-2 by FDA under an Emergency Use Authorization (EUA). This EUA will remain in effect (meaning this test can be used) for the duration of the COVID-19 declaration under Section 564(b)(1) of the Act, 21 U.S.C. section 360bbb-3(b)(1), unless the authorization is terminated or revoked.  Performed at [facility], [address], [place], [place] [address]   MRSA Next Gen by PCR, Nasal     Status: None   Collection Time: 10/08/21 12:13 AM   Specimen: Nasal Mucosa; Nasal Swab  Result Value Ref Range Status   MRSA by PCR Next Gen NOT DETECTED NOT DETECTED Final    Comment: (NOTE) The GeneXpert MRSA Assay (FDA approved for NASAL specimens only), is one component of a comprehensive MRSA colonization surveillance program. It is not intended to diagnose MRSA  infection nor to guide or monitor treatment for MRSA infections. Test performance is not FDA approved in patients less than 69 years old. Performed at [facility], [place] [address] [place], [place] [address]     Radiology Reports ECHOCARDIOGRAM COMPLETE  Result Date: 10/08/2021    ECHOCARDIOGRAM REPORT   Patient Name:   Wesley Beck Date of Exam: 10/08/2021 Medical Rec #:  [id number]         Height:       69.0 in Accession #:    [id number]        Weight:       183.2 lb Date of Birth:  09/05/1970         BSA:          1.990 m Patient Age:    5 years          BP:           105/72 mmHg Patient Gender: M                 HR:           61 bpm. Exam Location:  Inpatient Procedure: 2D Echo, Cardiac  Doppler and Color Doppler Indications:    Elevated d-dimer [730066]  History:        Patient has no prior history of Echocardiogram examinations.                 Risk Factors:Current Smoker. Schizophrenia, paranoid type ([place])                 (From Hx), Bipolar 1 disorder ([place]) (From Hx), Cannabis abuse.  Sonographer:    Alvino Chapel RCS Referring Phys: [id number] [place]  1. Left ventricular ejection fraction, by estimation, is 60 to 65%. The left ventricle has normal function. The left ventricle has no regional wall motion abnormalities. Left ventricular diastolic parameters were normal.  2. Right ventricular systolic function is normal. The right ventricular size is normal. Tricuspid regurgitation signal is inadequate for assessing PA pressure.  3. The mitral valve is normal in structure. Trivial mitral valve regurgitation. No evidence of mitral stenosis.  4. The aortic valve is tricuspid. Aortic valve regurgitation is not visualized. No aortic stenosis is present.  5. The inferior vena cava is dilated in size with >50% respiratory variability, suggesting right atrial pressure of 8 mmHg. FINDINGS  Left Ventricle: Left ventricular ejection fraction, by estimation, is 60 to 65%. The left  ventricle has normal function. The left ventricle has no regional wall motion abnormalities. The left ventricular internal cavity size was normal in size. There is  no left ventricular hypertrophy. Left ventricular diastolic parameters were normal. Right Ventricle: The right ventricular size is normal. No increase in right ventricular wall thickness. Right ventricular systolic function is normal. Tricuspid regurgitation signal is inadequate for assessing PA pressure. Left Atrium: Left atrial size was normal in size. Right Atrium: Right atrial size was normal in size. Pericardium: There is no evidence of pericardial effusion. Mitral Valve: The mitral valve is normal in structure. Trivial mitral valve regurgitation. No evidence of mitral valve stenosis. Tricuspid Valve: The tricuspid valve is normal in structure. Tricuspid valve regurgitation is not demonstrated. No evidence of tricuspid stenosis. Aortic Valve: The aortic valve is tricuspid. Aortic valve regurgitation is not visualized. No aortic stenosis is present. Pulmonic Valve: The pulmonic valve was normal in structure. Pulmonic valve regurgitation is trivial. No evidence of pulmonic stenosis. Aorta: The aortic root is normal in size and structure. Venous: The inferior vena cava is dilated in size with greater than 50% respiratory variability, suggesting right atrial pressure of 8 mmHg. IAS/Shunts: No atrial level shunt detected by color flow Doppler.  LEFT VENTRICLE PLAX 2D LVIDd:         5.10 cm   Diastology LVIDs:         2.80 cm   LV e' medial:    9.46 cm/s LV PW:         0.90 cm   LV E/e' medial:  9.3 LV IVS:        0.90 cm   LV e' lateral:   11.00 cm/s LVOT diam:     1.80 cm   LV E/e' lateral: 8.0 LV SV:         83 LV SV Index:   42 LVOT Area:     2.54 cm  RIGHT VENTRICLE RV S prime:     14.70 cm/s TAPSE (M-mode): 1.8 cm LEFT ATRIUM             Index        RIGHT ATRIUM  Index LA diam:        3.70 cm 1.86 cm/m   RA Area:     17.10 cm LA Vol  (A2C):   63.1 ml 31.70 ml/m  RA Volume:   49.40 ml  24.82 ml/m LA Vol (A4C):   51.2 ml 25.72 ml/m LA Biplane Vol: 61.2 ml 30.75 ml/m  AORTIC VALVE LVOT Vmax:   151.00 cm/s LVOT Vmean:  98.900 cm/s LVOT VTI:    0.325 m  AORTA Ao Root diam: 3.30 cm MITRAL VALVE MV Area (PHT): 2.80 cm    SHUNTS MV Decel Time: 271 msec    Systemic VTI:  0.32 m MV E velocity: 88.30 cm/s  Systemic Diam: 1.80 cm MV A velocity: 72.50 cm/s MV E/A ratio:  1.22 Cherlynn Kaiser MD Electronically signed by Cherlynn Kaiser MD Signature Date/Time: 10/08/2021/2:57:45 PM    Final    VAS [place] LOWER EXTREMITY VENOUS (DVT)  Result Date: 10/08/2021  Lower Venous DVT Study Patient Name:  Wesley Beck  Date of Exam:   10/08/2021 Medical Rec #: [id number]          Accession #:    [id number] Date of Birth: 1970/07/18          Patient Gender: M Patient Age:   51 years Exam Location:  [facility] Procedure:      VAS [place] LOWER EXTREMITY VENOUS (DVT) Referring Phys: Hillery Aldo --------------------------------------------------------------------------------  Indications: SOB, and Respiratory failure, elevated D-Dimer.  Comparison Study: No prior study on file Performing Technologist: Sharion Dove RVS  Examination Guidelines: A complete evaluation includes B-mode imaging, spectral Doppler, color Doppler, and power Doppler as needed of all accessible portions of each vessel. Bilateral testing is considered an integral part of a complete examination. Limited examinations for reoccurring indications may be performed as noted. The reflux portion of the exam is performed with the patient in reverse Trendelenburg.  +---------+---------------+---------+-----------+----------+--------------+ RIGHT    CompressibilityPhasicitySpontaneityPropertiesThrombus Aging +---------+---------------+---------+-----------+----------+--------------+ CFV      Full           Yes      Yes                                  +---------+---------------+---------+-----------+----------+--------------+ SFJ      Full                                                        +---------+---------------+---------+-----------+----------+--------------+ FV Prox  Full                                                        +---------+---------------+---------+-----------+----------+--------------+ FV Mid   Full                                                        +---------+---------------+---------+-----------+----------+--------------+ FV DistalFull                                                        +---------+---------------+---------+-----------+----------+--------------+  PFV      Full                                                        +---------+---------------+---------+-----------+----------+--------------+ POP      Full           Yes      Yes                                 +---------+---------------+---------+-----------+----------+--------------+ PTV      Full                                                        +---------+---------------+---------+-----------+----------+--------------+ PERO     Full                                                        +---------+---------------+---------+-----------+----------+--------------+   +---------+---------------+---------+-----------+----------+--------------+ LEFT     CompressibilityPhasicitySpontaneityPropertiesThrombus Aging +---------+---------------+---------+-----------+----------+--------------+ CFV      Full           Yes      Yes                                 +---------+---------------+---------+-----------+----------+--------------+ SFJ      Full                                                        +---------+---------------+---------+-----------+----------+--------------+ FV Prox  Full                                                         +---------+---------------+---------+-----------+----------+--------------+ FV Mid   Full                                                        +---------+---------------+---------+-----------+----------+--------------+ FV DistalFull                                                        +---------+---------------+---------+-----------+----------+--------------+ PFV      Full                                                        +---------+---------------+---------+-----------+----------+--------------+  POP      Full           Yes      Yes                                 +---------+---------------+---------+-----------+----------+--------------+ PTV      Full                                                        +---------+---------------+---------+-----------+----------+--------------+ PERO     Full                                                        +---------+---------------+---------+-----------+----------+--------------+     Summary: BILATERAL: - No evidence of deep vein thrombosis seen in the lower extremities, bilaterally. -No evidence of popliteal cyst, bilaterally.   *See table(s) above for measurements and observations. Electronically signed by Monica Martinez MD on 10/08/2021 at 10:11:01 AM.    Final    DG CHEST PORT 1 VIEW  Result Date: 10/08/2021 CLINICAL DATA:  Endotracheal tube placement. EXAM: PORTABLE CHEST 1 VIEW COMPARISON:  10/07/2021 at 6:26 p.m. FINDINGS: Endotracheal tube tip currently projects 6.3 cm above the carina. Nasal/orogastric tube passes below the diaphragm, unchanged. Lungs are hyperexpanded, but clear. No pleural effusion or pneumothorax. IMPRESSION: 1. Endotracheal tube tip projects 6.3 cm above the carina. 2. Lungs remain clear. Electronically Signed   By: Lajean Manes M.D.   On: 10/08/2021 08:13   CT Head Wo Contrast  Result Date: 10/07/2021 CLINICAL DATA:  Mental status change, unknown cause EXAM: CT HEAD WITHOUT CONTRAST  TECHNIQUE: Contiguous axial images were obtained from the base of the skull through the vertex without intravenous contrast. RADIATION DOSE REDUCTION: This exam was performed according to the departmental dose-optimization program which includes automated exposure control, adjustment of the mA and/or kV according to patient size and/or use of iterative reconstruction technique. COMPARISON:  None Available. FINDINGS: Brain: Normal anatomic configuration. No abnormal intra or extra-axial mass lesion or fluid collection. No abnormal mass effect or midline shift. No evidence of acute intracranial hemorrhage or infarct. Ventricular size is normal. Cerebellum unremarkable. Vascular: Unremarkable Skull: Intact Sinuses/Orbits: Paranasal sinuses are clear. Orbits are unremarkable. Other: Mastoid air cells and middle ear cavities are clear. IMPRESSION: No acute intracranial abnormality. Electronically Signed   By: Fidela Salisbury M.D.   On: 10/07/2021 21:29   DG Chest 1 View  Result Date: 10/07/2021 CLINICAL DATA:  Status post intubation. Shortness of breath. Diaphoresis. EXAM: CHEST  1 VIEW COMPARISON:  06/22/21 FINDINGS: ET tube tip is in satisfactory position above the carina. There is a nasogastric tube with tip and side port below the GE junction. Heart size and mediastinal contours appear normal. No pleural effusion or edema identified. No airspace opacities. The visualized osseous structures are unremarkable. IMPRESSION: Satisfactory position of ET tube and nasogastric tube. Lungs are clear. Electronically Signed   By: Kerby Moors M.D.   On: 10/07/2021 18:41

## 2021-10-11 ENCOUNTER — Other Ambulatory Visit (HOSPITAL_COMMUNITY): Payer: Self-pay

## 2021-10-11 DIAGNOSIS — J4541 Moderate persistent asthma with (acute) exacerbation: Secondary | ICD-10-CM | POA: Diagnosis not present

## 2021-10-11 DIAGNOSIS — R0902 Hypoxemia: Secondary | ICD-10-CM | POA: Diagnosis not present

## 2021-10-11 DIAGNOSIS — J9602 Acute respiratory failure with hypercapnia: Secondary | ICD-10-CM | POA: Diagnosis not present

## 2021-10-11 DIAGNOSIS — J9601 Acute respiratory failure with hypoxia: Secondary | ICD-10-CM | POA: Diagnosis not present

## 2021-10-11 LAB — CBC WITH DIFFERENTIAL/PLATELET
Abs Immature Granulocytes: 0.07 10*3/uL (ref 0.00–0.07)
Basophils Absolute: 0 10*3/uL (ref 0.0–0.1)
Basophils Relative: 0 %
Eosinophils Absolute: 0.1 10*3/uL (ref 0.0–0.5)
Eosinophils Relative: 1 %
HCT: 38 % — ABNORMAL LOW (ref 39.0–52.0)
Hemoglobin: 13.4 g/dL (ref 13.0–17.0)
Immature Granulocytes: 1 %
Lymphocytes Relative: 8 %
Lymphs Abs: 0.8 10*3/uL (ref 0.7–4.0)
MCH: 32.3 pg (ref 26.0–34.0)
MCHC: 35.3 g/dL (ref 30.0–36.0)
MCV: 91.6 fL (ref 80.0–100.0)
Monocytes Absolute: 0.9 10*3/uL (ref 0.1–1.0)
Monocytes Relative: 9 %
Neutro Abs: 8 10*3/uL — ABNORMAL HIGH (ref 1.7–7.7)
Neutrophils Relative %: 81 %
Platelets: 246 10*3/uL (ref 150–400)
RBC: 4.15 MIL/uL — ABNORMAL LOW (ref 4.22–5.81)
RDW: 12.9 % (ref 11.5–15.5)
WBC: 9.9 10*3/uL (ref 4.0–10.5)
nRBC: 0 % (ref 0.0–0.2)

## 2021-10-11 LAB — GLUCOSE, CAPILLARY
Glucose-Capillary: 236 mg/dL — ABNORMAL HIGH (ref 70–99)
Glucose-Capillary: 114 mg/dL — ABNORMAL HIGH (ref 70–99)

## 2021-10-11 LAB — BASIC METABOLIC PANEL
Anion gap: 12 (ref 5–15)
BUN: 11 mg/dL (ref 6–20)
CO2: 25 mmol/L (ref 22–32)
Calcium: 9.1 mg/dL (ref 8.9–10.3)
Chloride: 103 mmol/L (ref 98–111)
Creatinine, Ser: 0.95 mg/dL (ref 0.61–1.24)
GFR, Estimated: >60 mL/min (ref >60)
Glucose, Bld: 137 mg/dL — ABNORMAL HIGH (ref 70–99)
Potassium: 3.5 mmol/L (ref 3.5–5.1)
Sodium: 140 mmol/L (ref 135–145)

## 2021-10-11 LAB — MAGNESIUM: Magnesium: 1.7 mg/dL (ref 1.7–2.4)

## 2021-10-11 LAB — BRAIN NATRIURETIC PEPTIDE: B Natriuretic Peptide: 29.3 pg/mL (ref 0.0–100.0)

## 2021-10-11 MED ORDER — FLUTICASONE-SALMETEROL 250-50 MCG/ACT IN AEPB
1.0000 | INHALATION_SPRAY | Freq: Two times a day (BID) | RESPIRATORY_TRACT | 0 refills | Status: DC
  Filled 2021-10-11: qty 1, 1d supply, fill #0
  Filled 2021-10-11: qty 60, 30d supply, fill #0

## 2021-10-11 MED ORDER — FUROSEMIDE 10 MG/ML IJ SOLN
20.0000 mg | Freq: Once | INTRAMUSCULAR | Status: AC
  Administered 2021-10-11: 20 mg via INTRAVENOUS
  Filled 2021-10-11: qty 2

## 2021-10-11 MED ORDER — PREDNISONE 5 MG PO TABS
ORAL_TABLET | ORAL | 0 refills | Status: DC
  Filled 2021-10-11: qty 65, 18d supply, fill #0

## 2021-10-11 MED ORDER — MONTELUKAST SODIUM 10 MG PO TABS
10.0000 mg | ORAL_TABLET | Freq: Every day | ORAL | 0 refills | Status: DC
  Filled 2021-10-11: qty 30, 30d supply, fill #0

## 2021-10-11 MED ORDER — PANTOPRAZOLE SODIUM 40 MG PO TBEC
40.0000 mg | DELAYED_RELEASE_TABLET | Freq: Every day | ORAL | 0 refills | Status: DC
  Filled 2021-10-11: qty 30, 30d supply, fill #0

## 2021-10-11 MED ORDER — POTASSIUM CHLORIDE CRYS ER 20 MEQ PO TBCR
40.0000 meq | EXTENDED_RELEASE_TABLET | Freq: Once | ORAL | Status: AC
  Administered 2021-10-11: 40 meq via ORAL
  Filled 2021-10-11: qty 2

## 2021-10-11 MED ORDER — ALBUTEROL SULFATE HFA 108 (90 BASE) MCG/ACT IN AERS
1.0000 | INHALATION_SPRAY | Freq: Four times a day (QID) | RESPIRATORY_TRACT | 0 refills | Status: DC | PRN
  Filled 2021-10-11: qty 6.7, 15d supply, fill #0

## 2021-10-11 MED ORDER — LEVOFLOXACIN 750 MG PO TABS
750.0000 mg | ORAL_TABLET | Freq: Every day | ORAL | 0 refills | Status: DC
  Filled 2021-10-11: qty 3, 3d supply, fill #0

## 2021-10-11 MED ORDER — ALBUTEROL SULFATE (2.5 MG/3ML) 0.083% IN NEBU
2.5000 mg | INHALATION_SOLUTION | Freq: Four times a day (QID) | RESPIRATORY_TRACT | 0 refills | Status: DC | PRN
  Filled 2021-10-11: qty 90, 8d supply, fill #0

## 2021-10-11 MED ORDER — MAGNESIUM SULFATE 2 GM/50ML IV SOLN
2.0000 g | Freq: Once | INTRAVENOUS | Status: AC
  Administered 2021-10-11: 2 g via INTRAVENOUS
  Filled 2021-10-11: qty 50

## 2021-10-11 NOTE — Discharge Summary (Signed)
DOMINGOS RIGGI WOE:[id number] DOB: July 26, 1970 DOA: 10/07/2021  PCP: Benetta Spar, MD  Admit date: 10/07/2021  Discharge date: 10/11/2021  Admitted From: Home   Disposition:  Home   Recommendations for Outpatient Follow-up:   Follow up with PCP in 1-2 weeks  PCP Please obtain BMP/CBC, 2 view CXR in 1week,  (see Discharge instructions)   PCP Please follow up on the following pending results: Close outpatient pulmonary follow-up kindly arrange for it.   Home Health: None   Equipment/Devices: as below  Consultations: None  Discharge Condition: Stable    CODE STATUS: Full    Diet Recommendation: Heart Healthy    Chief Complaint  Patient presents with   Shortness of Breath     Brief history of present illness from the day of admission and additional interim summary     51 y.o. male, who presented to [facility] via [facility] with a chief complaint of SOB.  Arrived hypoxic with RA sats 50% and unresponsive.  Patient was intubated in the [facility] ED. Tmax 98.7 WBC 13.1. Lactate WNL. Cxr with no obvious infiltrate, pneumo, effusion. CT head negative.  UDS +THC.  Transferred to [facility], PCCM admitted.  He was diagnosed with asthma exacerbation he was extubated on 10/09/2021, transferred to my service on 10/10/2021 on room air.   9/1 Presented to [facility] ED, CT head negative, ETT>, TXR to [facility] 9/2 extubated                                                                 Hospital Course    Acute hypoxic and hypercapnic respiratory failure due to asthma exacerbation in a patient with active smoker and uses marijuana as well. He was admitted by PCCM briefly intubated for 24 hours, extubated on 10/09/2021, now on room air but mild wheezing, has been treated with steroids and now transition to oral steroids, continue breathing treatments,  steroid taper, advance activity, add I-S and flutter valve.  Counseled to quit smoking and THC.    He is now completely symptom-free on room air walking around in the hallway without any distress for the last 24 hours, will be discharged home on oral steroid taper, inhaled long-acting steroid and bronchodilator, short acting albuterol rescue inhaler along with nebulizer treatment as needed, Singulair, with outpatient PCP and pulmonary follow-up.  PCP to kindly arrange for pulmonary follow-up within the next 1 to 2 weeks.  He is now completely symptom-free.  Blood pressure is stable hence I have discontinued Coreg which could be causing some bronchial reactivity.  CTA was obtained by pulmonary team which showed possible bronchitis for which a short course of Levaquin has been initiated, of note patient currently is completely symptom-free.   Discharge diagnosis     Principal Problem:   Respiratory failure (HCC) Active  Problems:   Acute respiratory failure with hypoxia and hypercarbia (HCC)   Hypoxia    Discharge instructions    Discharge Instructions     Diet - low sodium heart healthy   Complete by: As directed    Discharge instructions   Complete by: As directed    Follow with Primary MD Benetta Spar, MD in 7 days   Get CBC, CMP, 2 view Chest X ray -  checked next visit within 1 week by Primary MD    Activity: As tolerated with Full fall precautions use walker/cane & assistance as needed  Disposition Home   Diet: Heart Healthy    Special Instructions: If you have smoked or chewed Tobacco  in the last 2 yrs please stop smoking, stop any regular Alcohol  and or any Recreational drug use.  On your next visit with your primary care physician please Get Medicines reviewed and adjusted.  Please request your Prim.MD to go over all Hospital Tests and Procedure/Radiological results at the follow up, please get all Hospital records sent to your Prim MD by signing hospital  release before you go home.  If you experience worsening of your admission symptoms, develop shortness of breath, life threatening emergency, suicidal or homicidal thoughts you must seek medical attention immediately by calling 911 or calling your MD immediately  if symptoms less severe.  You Must read complete instructions/literature along with all the possible adverse reactions/side effects for all the Medicines you take and that have been prescribed to you. Take any new Medicines after you have completely understood and accpet all the possible adverse reactions/side effects.   For home use only DME Nebulizer machine   Complete by: As directed    Patient needs a nebulizer to treat with the following condition: Asthma   Length of Need: 6 Months   Increase activity slowly   Complete by: As directed        Discharge Medications   Allergies as of 10/11/2021   No Known Allergies      Medication List     STOP taking these medications    carvedilol 3.125 MG tablet Commonly known as: COREG   meloxicam 7.5 MG tablet Commonly known as: MOBIC       TAKE these medications    albuterol 108 (90 Base) MCG/ACT inhaler Commonly known as: VENTOLIN HFA Inhale 1-2 puffs into the lungs every 6 (six) hours as needed for wheezing or shortness of breath. What changed: Another medication with the same name was added. Make sure you understand how and when to take each.   albuterol (2.5 MG/3ML) 0.083% nebulizer solution Commonly known as: PROVENTIL Take 3 mLs (2.5 mg total) by nebulization every 6 (six) hours as needed for wheezing or shortness of breath. What changed: You were already taking a medication with the same name, and this prescription was added. Make sure you understand how and when to take each.   levofloxacin 750 MG tablet Commonly known as: LEVAQUIN Take 1 tablet (750 mg total) by mouth daily.   montelukast 10 MG tablet Commonly known as: SINGULAIR Take 1 tablet (10 mg  total) by mouth at bedtime.   OLANZapine 5 MG tablet Commonly known as: ZYPREXA Take 5 mg by mouth 2 (two) times daily.   pantoprazole 40 MG tablet Commonly known as: PROTONIX Take 1 tablet (40 mg total) by mouth daily.   predniSONE 5 MG tablet Commonly known as: DELTASONE Label  & dispense according to the schedule below. take 8  Pills PO for 3 days, 6 Pills PO for 3 days, 4 Pills PO for 3 days, 2 Pills PO for 3 days, 1 Pills PO for 3 days, 1/2 Pill  PO for 3 days then STOP. Total 65 pills.   Wixela Inhub 250-50 MCG/ACT Aepb Generic drug: fluticasone-salmeterol Inhale 1 puff into the lungs 2 (two) times daily.               Durable Medical Equipment  (From admission, onward)           Start     Ordered   10/11/21 0000  For home use only DME Nebulizer machine       Question Answer Comment  Patient needs a nebulizer to treat with the following condition Asthma   Length of Need 6 Months      10/11/21 0819             Follow-up Information     Benetta Spar, MD. Schedule an appointment as soon as possible for a visit in 1 week(s).   Specialty: Internal Medicine Contact information: [address] [place] [address] [phone]         Charlott Holler, MD. Schedule an appointment as soon as possible for a visit in 1 week(s).   Specialty: Pulmonary Disease Contact information: [address] [place] [address] [phone]                 Major procedures and Radiology Reports - PLEASE review detailed and final reports thoroughly  -      CT Angio Chest Pulmonary Embolism (PE) W or WO Contrast  Result Date: 10/10/2021 CLINICAL DATA:  Hypoxia.  Shortness of breath. EXAM: CT ANGIOGRAPHY CHEST WITH CONTRAST TECHNIQUE: Multidetector CT imaging of the chest was performed using the standard protocol during bolus administration of intravenous contrast. Multiplanar CT image reconstructions and MIPs were obtained to  evaluate the vascular anatomy. RADIATION DOSE REDUCTION: This exam was performed according to the departmental dose-optimization program which includes automated exposure control, adjustment of the mA and/or kV according to patient size and/or use of iterative reconstruction technique. CONTRAST:  75mL OMNIPAQUE IOHEXOL 350 MG/ML SOLN COMPARISON:  Chest radiograph 10/08/2021 FINDINGS: Cardiovascular: Pulmonary arterial opacification is adequate without evidence of emboli within limitations of motion artifact which intermittently limits assessment of segmental and subsegmental vessels, predominantly in the left greater than right lower lobes. The thoracic aorta is normal in caliber. The heart is normal in size. There is no pericardial effusion. Mediastinum/Nodes: No enlarged axillary, mediastinal, or hilar lymph nodes. Unremarkable thyroid and esophagus. Lungs/Pleura: No pleural effusion or pneumothorax. There is bronchial wall thickening, greatest in the lower lobes where some small bronchi are opacified, and there are patchy and clustered nodular opacities in the left lower lobe with basilar atelectasis. There are scattered small nodules in the right greater than left upper lobes, with the largest measuring 4 mm in the right upper lobe (series 7, image 30). Upper Abdomen: No acute abnormality. Musculoskeletal: No suspicious osseous lesion. Numerous small Schmorl's nodes in the mid and lower thoracic spine. Review of the MIP images confirms the above findings. IMPRESSION: 1. No evidence of pulmonary emboli. 2. Bronchial wall thickening with asymmetric patchy and clustered nodular opacities in the left lower lobe which could reflect pneumonia or aspiration with atelectasis. 3. Scattered small nodules in both upper lobes measuring up to 4 mm. Per Fleischner Society Guidelines, if patient is low risk for malignancy, no routine follow-up imaging is  recommended; if patient is high risk for malignancy, a non-contrast Chest  CT at 12 months is optional. If performed and the nodule is stable at 12 months, no further follow-up is recommended. These guidelines do not apply to immunocompromised patients and patients with cancer. Follow up in patients with significant comorbidities as clinically warranted. For lung cancer screening, adhere to Lung-RADS guidelines. Reference: Radiology. 2017; 284(1):228-43. Electronically Signed   By: Sebastian AcheAllen  Grady M.D.   On: 10/10/2021 11:44   ECHOCARDIOGRAM COMPLETE  Result Date: 10/08/2021    ECHOCARDIOGRAM REPORT   Patient Name:   Hali MarryWILLIAM J Luty Date of Exam: 10/08/2021 Medical Rec #:  [id number]         Height:       69.0 in Accession #:    [id number]        Weight:       183.2 lb Date of Birth:  [birth year]         BSA:          1.990 m Patient Age:    50 years          BP:           105/72 mmHg Patient Gender: M                 HR:           61 bpm. Exam Location:  Inpatient Procedure: 2D Echo, Cardiac Doppler and Color Doppler Indications:    Elevated d-dimer [730066]  History:        Patient has no prior history of Echocardiogram examinations.                 Risk Factors:Current Smoker. Schizophrenia, paranoid type (HCC)                 (From Hx), Bipolar 1 disorder (HCC) (From Hx), Cannabis abuse.  Sonographer:    Celesta GentileBernard White RCS Referring Phys: [id number] Eliezer ChampagneIMOTHY S GEORGE IMPRESSIONS  1. Left ventricular ejection fraction, by estimation, is 60 to 65%. The left ventricle has normal function. The left ventricle has no regional wall motion abnormalities. Left ventricular diastolic parameters were normal.  2. Right ventricular systolic function is normal. The right ventricular size is normal. Tricuspid regurgitation signal is inadequate for assessing PA pressure.  3. The mitral valve is normal in structure. Trivial mitral valve regurgitation. No evidence of mitral stenosis.  4. The aortic valve is tricuspid. Aortic valve regurgitation is not visualized. No aortic stenosis is present.  5. The inferior  vena cava is dilated in size with >50% respiratory variability, suggesting right atrial pressure of 8 mmHg. FINDINGS  Left Ventricle: Left ventricular ejection fraction, by estimation, is 60 to 65%. The left ventricle has normal function. The left ventricle has no regional wall motion abnormalities. The left ventricular internal cavity size was normal in size. There is  no left ventricular hypertrophy. Left ventricular diastolic parameters were normal. Right Ventricle: The right ventricular size is normal. No increase in right ventricular wall thickness. Right ventricular systolic function is normal. Tricuspid regurgitation signal is inadequate for assessing PA pressure. Left Atrium: Left atrial size was normal in size. Right Atrium: Right atrial size was normal in size. Pericardium: There is no evidence of pericardial effusion. Mitral Valve: The mitral valve is normal in structure. Trivial mitral valve regurgitation. No evidence of mitral valve stenosis. Tricuspid Valve: The tricuspid valve is normal in structure. Tricuspid valve regurgitation is not demonstrated. No evidence of tricuspid stenosis. Aortic Valve:  The aortic valve is tricuspid. Aortic valve regurgitation is not visualized. No aortic stenosis is present. Pulmonic Valve: The pulmonic valve was normal in structure. Pulmonic valve regurgitation is trivial. No evidence of pulmonic stenosis. Aorta: The aortic root is normal in size and structure. Venous: The inferior vena cava is dilated in size with greater than 50% respiratory variability, suggesting right atrial pressure of 8 mmHg. IAS/Shunts: No atrial level shunt detected by color flow Doppler.  LEFT VENTRICLE PLAX 2D LVIDd:         5.10 cm   Diastology LVIDs:         2.80 cm   LV e' medial:    9.46 cm/s LV PW:         0.90 cm   LV E/e' medial:  9.3 LV IVS:        0.90 cm   LV e' lateral:   11.00 cm/s LVOT diam:     1.80 cm   LV E/e' lateral: 8.0 LV SV:         83 LV SV Index:   42 LVOT Area:     2.54  cm  RIGHT VENTRICLE RV S prime:     14.70 cm/s TAPSE (M-mode): 1.8 cm LEFT ATRIUM             Index        RIGHT ATRIUM           Index LA diam:        3.70 cm 1.86 cm/m   RA Area:     17.10 cm LA Vol (A2C):   63.1 ml 31.70 ml/m  RA Volume:   49.40 ml  24.82 ml/m LA Vol (A4C):   51.2 ml 25.72 ml/m LA Biplane Vol: 61.2 ml 30.75 ml/m  AORTIC VALVE LVOT Vmax:   151.00 cm/s LVOT Vmean:  98.900 cm/s LVOT VTI:    0.325 m  AORTA Ao Root diam: 3.30 cm MITRAL VALVE MV Area (PHT): 2.80 cm    SHUNTS MV Decel Time: 271 msec    Systemic VTI:  0.32 m MV E velocity: 88.30 cm/s  Systemic Diam: 1.80 cm MV A velocity: 72.50 cm/s MV E/A ratio:  1.22 Weston Brass MD Electronically signed by Weston Brass MD Signature Date/Time: 10/08/2021/2:57:45 PM    Final    VAS [place] LOWER EXTREMITY VENOUS (DVT)  Result Date: 10/08/2021  Lower Venous DVT Study Patient Name:  ZYMIR NAPOLI  Date of Exam:   10/08/2021 Medical Rec #: [id number]          Accession #:    [id number] Date of Birth: [birth year]          Patient Gender: M Patient Age:   72 years Exam Location:  [facility] Procedure:      VAS [place] LOWER EXTREMITY VENOUS (DVT) Referring Phys: Karle Barr --------------------------------------------------------------------------------  Indications: SOB, and Respiratory failure, elevated D-Dimer.  Comparison Study: No prior study on file Performing Technologist: Sherren Kerns RVS  Examination Guidelines: A complete evaluation includes B-mode imaging, spectral Doppler, color Doppler, and power Doppler as needed of all accessible portions of each vessel. Bilateral testing is considered an integral part of a complete examination. Limited examinations for reoccurring indications may be performed as noted. The reflux portion of the exam is performed with the patient in reverse Trendelenburg.  +---------+---------------+---------+-----------+----------+--------------+ RIGHT     CompressibilityPhasicitySpontaneityPropertiesThrombus Aging +---------+---------------+---------+-----------+----------+--------------+ CFV      Full           Yes  Yes                                 +---------+---------------+---------+-----------+----------+--------------+ SFJ      Full                                                        +---------+---------------+---------+-----------+----------+--------------+ FV Prox  Full                                                        +---------+---------------+---------+-----------+----------+--------------+ FV Mid   Full                                                        +---------+---------------+---------+-----------+----------+--------------+ FV DistalFull                                                        +---------+---------------+---------+-----------+----------+--------------+ PFV      Full                                                        +---------+---------------+---------+-----------+----------+--------------+ POP      Full           Yes      Yes                                 +---------+---------------+---------+-----------+----------+--------------+ PTV      Full                                                        +---------+---------------+---------+-----------+----------+--------------+ PERO     Full                                                        +---------+---------------+---------+-----------+----------+--------------+   +---------+---------------+---------+-----------+----------+--------------+ LEFT     CompressibilityPhasicitySpontaneityPropertiesThrombus Aging +---------+---------------+---------+-----------+----------+--------------+ CFV      Full           Yes      Yes                                 +---------+---------------+---------+-----------+----------+--------------+ SFJ      Full                                                         +---------+---------------+---------+-----------+----------+--------------+  FV Prox  Full                                                        +---------+---------------+---------+-----------+----------+--------------+ FV Mid   Full                                                        +---------+---------------+---------+-----------+----------+--------------+ FV DistalFull                                                        +---------+---------------+---------+-----------+----------+--------------+ PFV      Full                                                        +---------+---------------+---------+-----------+----------+--------------+ POP      Full           Yes      Yes                                 +---------+---------------+---------+-----------+----------+--------------+ PTV      Full                                                        +---------+---------------+---------+-----------+----------+--------------+ PERO     Full                                                        +---------+---------------+---------+-----------+----------+--------------+     Summary: BILATERAL: - No evidence of deep vein thrombosis seen in the lower extremities, bilaterally. -No evidence of popliteal cyst, bilaterally.   *See table(s) above for measurements and observations. Electronically signed by Sherald Hess MD on 10/08/2021 at 10:11:01 AM.    Final    DG CHEST PORT 1 VIEW  Result Date: 10/08/2021 CLINICAL DATA:  Endotracheal tube placement. EXAM: PORTABLE CHEST 1 VIEW COMPARISON:  10/07/2021 at 6:26 p.m. FINDINGS: Endotracheal tube tip currently projects 6.3 cm above the carina. Nasal/orogastric tube passes below the diaphragm, unchanged. Lungs are hyperexpanded, but clear. No pleural effusion or pneumothorax. IMPRESSION: 1. Endotracheal tube tip projects 6.3 cm above the carina. 2. Lungs remain clear. Electronically Signed   By: Amie Portland M.D.   On: 10/08/2021 08:13   CT Head Wo Contrast  Result Date: 10/07/2021 CLINICAL DATA:  Mental status change, unknown cause EXAM: CT HEAD WITHOUT CONTRAST TECHNIQUE: Contiguous axial images were obtained from the base of the skull through the vertex without intravenous  contrast. RADIATION DOSE REDUCTION: This exam was performed according to the departmental dose-optimization program which includes automated exposure control, adjustment of the mA and/or kV according to patient size and/or use of iterative reconstruction technique. COMPARISON:  None Available. FINDINGS: Brain: Normal anatomic configuration. No abnormal intra or extra-axial mass lesion or fluid collection. No abnormal mass effect or midline shift. No evidence of acute intracranial hemorrhage or infarct. Ventricular size is normal. Cerebellum unremarkable. Vascular: Unremarkable Skull: Intact Sinuses/Orbits: Paranasal sinuses are clear. Orbits are unremarkable. Other: Mastoid air cells and middle ear cavities are clear. IMPRESSION: No acute intracranial abnormality. Electronically Signed   By: Helyn Numbers M.D.   On: 10/07/2021 21:29   DG Chest 1 View  Result Date: 10/07/2021 CLINICAL DATA:  Status post intubation. Shortness of breath. Diaphoresis. EXAM: CHEST  1 VIEW COMPARISON:  06/22/21 FINDINGS: ET tube tip is in satisfactory position above the carina. There is a nasogastric tube with tip and side port below the GE junction. Heart size and mediastinal contours appear normal. No pleural effusion or edema identified. No airspace opacities. The visualized osseous structures are unremarkable. IMPRESSION: Satisfactory position of ET tube and nasogastric tube. Lungs are clear. Electronically Signed   By: Signa Kell M.D.   On: 10/07/2021 18:41    Micro Results    Recent Results (from the past 240 hour(s))  Resp Panel by RT-PCR (Flu A&B, Covid) Anterior Nasal Swab     Status: None   Collection Time: 10/07/21  6:12 PM   Specimen:  Anterior Nasal Swab  Result Value Ref Range Status   SARS Coronavirus 2 by RT PCR NEGATIVE NEGATIVE Final    Comment: (NOTE) SARS-CoV-2 target nucleic acids are NOT DETECTED.  The SARS-CoV-2 RNA is generally detectable in upper respiratory specimens during the acute phase of infection. The lowest concentration of SARS-CoV-2 viral copies this assay can detect is 138 copies/mL. A negative result does not preclude SARS-Cov-2 infection and should not be used as the sole basis for treatment or other patient management decisions. A negative result may occur with  improper specimen collection/handling, submission of specimen other than nasopharyngeal swab, presence of viral mutation(s) within the areas targeted by this assay, and inadequate number of viral copies(<138 copies/mL). A negative result must be combined with clinical observations, patient history, and epidemiological information. The expected result is Negative.  Fact Sheet for Patients:  BloggerCourse.com  Fact Sheet for Healthcare Providers:  SeriousBroker.it  This test is no t yet approved or cleared by the [place] FDA and  has been authorized for detection and/or diagnosis of SARS-CoV-2 by FDA under an Emergency Use Authorization (EUA). This EUA will remain  in effect (meaning this test can be used) for the duration of the COVID-19 declaration under Section 564(b)(1) of the Act, 21 U.S.C.section 360bbb-3(b)(1), unless the authorization is terminated  or revoked sooner.       Influenza A by PCR NEGATIVE NEGATIVE Final   Influenza B by PCR NEGATIVE NEGATIVE Final    Comment: (NOTE) The Xpert Xpress SARS-CoV-2/FLU/RSV plus assay is intended as an aid in the diagnosis of influenza from Nasopharyngeal swab specimens and should not be used as a sole basis for treatment. Nasal washings and aspirates are unacceptable for Xpert Xpress SARS-CoV-2/FLU/RSV testing.  Fact  Sheet for Patients: BloggerCourse.com  Fact Sheet for Healthcare Providers: SeriousBroker.it  This test is not yet approved or cleared by the [place] FDA and has been authorized for detection and/or diagnosis of SARS-CoV-2 by FDA under an  Emergency Use Authorization (EUA). This EUA will remain in effect (meaning this test can be used) for the duration of the COVID-19 declaration under Section 564(b)(1) of the Act, 21 U.S.C. section 360bbb-3(b)(1), unless the authorization is terminated or revoked.  Performed at [facility], [address], [place], [place] [address]   MRSA Next Gen by PCR, Nasal     Status: None   Collection Time: 10/08/21 12:13 AM   Specimen: Nasal Mucosa; Nasal Swab  Result Value Ref Range Status   MRSA by PCR Next Gen NOT DETECTED NOT DETECTED Final    Comment: (NOTE) The GeneXpert MRSA Assay (FDA approved for NASAL specimens only), is one component of a comprehensive MRSA colonization surveillance program. It is not intended to diagnose MRSA infection nor to guide or monitor treatment for MRSA infections. Test performance is not FDA approved in patients less than 84 years old. Performed at [facility], [address], [place], [place] [address]     Today   Subjective    Cregg Jutte today has no headache,no chest abdominal pain,no new weakness tingling or numbness, feels much better wants to go home today.    Objective   Blood pressure 110/76, pulse 69, temperature 97.9 F (36.6 C), temperature source Oral, resp. rate 15, height 5\' 9"  (1.753 m), weight 79.9 kg, SpO2 94 %.   Intake/Output Summary (Last 24 hours) at 10/11/2021 0819 Last data filed at 10/11/2021 0700 Gross per 24 hour  Intake 1698.89 ml  Output 550 ml  Net 1148.89 ml    Exam  Awake Alert, No new F.N deficits,    .AT,PERRAL Supple Neck,   Symmetrical Chest wall movement, Good air movement bilaterally, minimal  intermittent expiratory wheezing RRR,No Gallops,   +ve B.Sounds, Abd Soft, Non tender,  No Cyanosis, Clubbing or edema    Data Review   Recent Labs  Lab 10/07/21 1800 10/08/21 0037 10/08/21 0122 10/09/21 0736 10/11/21 0023  WBC 13.1*  --  8.9 11.3* 9.9  HGB 16.0 13.6 14.9 12.5* 13.4  HCT 46.7 40.0 41.6 35.6* 38.0*  PLT 398  --  259 234 246  MCV 93.4  --  91.6 91.8 91.6  MCH 32.0  --  32.8 32.2 32.3  MCHC 34.3  --  35.8 35.1 35.3  RDW 12.6  --  12.6 12.8 12.9  LYMPHSABS 3.3  --   --   --  0.8  MONOABS 1.7*  --   --   --  0.9  EOSABS 1.1*  --   --   --  0.1  BASOSABS 0.2*  --   --   --  0.0    Recent Labs  Lab 10/07/21 1800 10/07/21 1856 10/07/21 1945 10/08/21 0037 10/08/21 0122 10/09/21 0736 10/11/21 0023  NA 139  --   --  140 141 142 140  K 3.7  --   --  3.5 3.8 3.5 3.5  CL 104  --   --   --  107 109 103  CO2 30  --   --   --  24 26 25   GLUCOSE 152*  --   --   --  159* 108* 137*  BUN 10  --   --   --  9 11 11   CREATININE 1.03  --   --   --  1.11 0.85 0.95  CALCIUM 9.1  --   --   --  8.8* 8.9 9.1  AST 36  --   --   --   --   --   --  ALT 26  --   --   --   --   --   --   ALKPHOS 97  --   --   --   --   --   --   BILITOT 1.5*  --   --   --   --   --   --   ALBUMIN 4.9  --   --   --   --   --   --   MG  --   --   --   --  2.3 2.1 1.7  PHOS  --   --   --   --  2.8  --   --   DDIMER 0.75*  --   --   --   --   --   --   LATICACIDVEN  --  0.8 0.9  --   --   --   --   INR 1.2  --   --   --   --   --   --   HGBA1C  --   --   --   --  4.9  --   --   BNP 24.0  --   --   --   --   --  29.3    Total Time in preparing paper work, data evaluation and todays exam - 35 minutes  Susa Raring M.D on 10/11/2021 at 8:19 AM  [facility]

## 2021-10-11 NOTE — TOC Progression Note (Signed)
Transition of Care [facility]) - Progression Note    Patient Details  Name: Wesley Beck MRN: [id number] Date of Birth: 05/07/1970  Transition [facility] CM/SW Contact  Beckie Busing, RN Phone [phone]  10/11/2021, 10:10 AM  Clinical Narrative:    Patient with discharge orders. DME nebulizer machine has been ordered and to be delivered to the bedside per [facility] healthcare. Nurse states that she is trying to get patient to discharge lounge. CM has made nurse aware that patient should wait in the discharge lounge until DME is delivered. [employer] has been made aware that patient will be in the discharge lounge in the [place] tower.         Expected Discharge Plan and Services           Expected Discharge Date: 10/11/21                                     Social Determinants of Health (SDOH) Interventions    Readmission Risk Interventions     No data to display

## 2021-10-11 NOTE — Plan of Care (Signed)

## 2021-10-11 NOTE — Progress Notes (Addendum)
Patient provided with verbal discharge instructions. Paper copy of discharge provided to patient. RN answered all questions. VSS at discharge. IV removed. Patient belongings sent with patient. TOC medications are to be brought to d/c lounge along with nebulizer.  Patient dc'd via wheelchair to d/c lounge by [facility] nurse Tammy.

## 2021-10-11 NOTE — Discharge Instructions (Signed)
Follow with Primary MD Benetta Spar, MD in 7 days   Get CBC, CMP, 2 view Chest X ray -  checked next visit within 1 week by Primary MD    Activity: As tolerated with Full fall precautions use walker/cane & assistance as needed  Disposition Home   Diet: Heart Healthy    Special Instructions: If you have smoked or chewed Tobacco  in the last 2 yrs please stop smoking, stop any regular Alcohol  and or any Recreational drug use.  On your next visit with your primary care physician please Get Medicines reviewed and adjusted.  Please request your Prim.MD to go over all Hospital Tests and Procedure/Radiological results at the follow up, please get all Hospital records sent to your Prim MD by signing hospital release before you go home.  If you experience worsening of your admission symptoms, develop shortness of breath, life threatening emergency, suicidal or homicidal thoughts you must seek medical attention immediately by calling 911 or calling your MD immediately  if symptoms less severe.  You Must read complete instructions/literature along with all the possible adverse reactions/side effects for all the Medicines you take and that have been prescribed to you. Take any new Medicines after you have completely understood and accpet all the possible adverse reactions/side effects.

## 2021-10-12 NOTE — Telephone Encounter (Signed)
Called and spoke to patient's father. Stated he would have patient give our office a call.

## 2021-10-13 ENCOUNTER — Other Ambulatory Visit (HOSPITAL_COMMUNITY): Payer: Self-pay

## 2021-10-20 ENCOUNTER — Emergency Department (HOSPITAL_COMMUNITY): Payer: Medicare Other

## 2021-10-20 ENCOUNTER — Encounter (HOSPITAL_COMMUNITY): Payer: Self-pay | Admitting: Emergency Medicine

## 2021-10-20 ENCOUNTER — Observation Stay (HOSPITAL_COMMUNITY)
Admission: EM | Admit: 2021-10-20 | Discharge: 2021-10-22 | Disposition: A | Discharge status: Home / Self Care | Payer: Medicare Other | Attending: Family Medicine | Admitting: Family Medicine

## 2021-10-20 DIAGNOSIS — I248 Other forms of acute ischemic heart disease: Secondary | ICD-10-CM | POA: Diagnosis not present

## 2021-10-20 DIAGNOSIS — F1910 Other psychoactive substance abuse, uncomplicated: Secondary | ICD-10-CM

## 2021-10-20 DIAGNOSIS — F17210 Nicotine dependence, cigarettes, uncomplicated: Secondary | ICD-10-CM | POA: Diagnosis not present

## 2021-10-20 DIAGNOSIS — S0993XA Unspecified injury of face, initial encounter: Secondary | ICD-10-CM | POA: Diagnosis not present

## 2021-10-20 DIAGNOSIS — R5381 Other malaise: Secondary | ICD-10-CM | POA: Diagnosis not present

## 2021-10-20 DIAGNOSIS — S0990XA Unspecified injury of head, initial encounter: Secondary | ICD-10-CM

## 2021-10-20 DIAGNOSIS — R69 Illness, unspecified: Secondary | ICD-10-CM | POA: Diagnosis not present

## 2021-10-20 DIAGNOSIS — F17200 Nicotine dependence, unspecified, uncomplicated: Secondary | ICD-10-CM | POA: Diagnosis present

## 2021-10-20 DIAGNOSIS — S51812A Laceration without foreign body of left forearm, initial encounter: Secondary | ICD-10-CM | POA: Diagnosis not present

## 2021-10-20 DIAGNOSIS — F1410 Cocaine abuse, uncomplicated: Secondary | ICD-10-CM

## 2021-10-20 DIAGNOSIS — M7981 Nontraumatic hematoma of soft tissue: Secondary | ICD-10-CM | POA: Diagnosis not present

## 2021-10-20 DIAGNOSIS — Z23 Encounter for immunization: Secondary | ICD-10-CM | POA: Insufficient documentation

## 2021-10-20 DIAGNOSIS — S41112A Laceration without foreign body of left upper arm, initial encounter: Secondary | ICD-10-CM | POA: Diagnosis not present

## 2021-10-20 DIAGNOSIS — S0083XA Contusion of other part of head, initial encounter: Secondary | ICD-10-CM | POA: Diagnosis not present

## 2021-10-20 DIAGNOSIS — F3160 Bipolar disorder, current episode mixed, unspecified: Secondary | ICD-10-CM | POA: Diagnosis present

## 2021-10-20 DIAGNOSIS — R52 Pain, unspecified: Secondary | ICD-10-CM | POA: Diagnosis not present

## 2021-10-20 DIAGNOSIS — D72829 Elevated white blood cell count, unspecified: Secondary | ICD-10-CM

## 2021-10-20 DIAGNOSIS — S199XXA Unspecified injury of neck, initial encounter: Secondary | ICD-10-CM | POA: Diagnosis not present

## 2021-10-20 DIAGNOSIS — I471 Supraventricular tachycardia, unspecified: Secondary | ICD-10-CM

## 2021-10-20 DIAGNOSIS — R778 Other specified abnormalities of plasma proteins: Secondary | ICD-10-CM | POA: Diagnosis not present

## 2021-10-20 DIAGNOSIS — K219 Gastro-esophageal reflux disease without esophagitis: Secondary | ICD-10-CM

## 2021-10-20 DIAGNOSIS — J439 Emphysema, unspecified: Secondary | ICD-10-CM | POA: Diagnosis not present

## 2021-10-20 DIAGNOSIS — J45909 Unspecified asthma, uncomplicated: Secondary | ICD-10-CM | POA: Diagnosis not present

## 2021-10-20 DIAGNOSIS — R079 Chest pain, unspecified: Secondary | ICD-10-CM | POA: Diagnosis not present

## 2021-10-20 DIAGNOSIS — Z79899 Other long term (current) drug therapy: Secondary | ICD-10-CM | POA: Insufficient documentation

## 2021-10-20 DIAGNOSIS — R7989 Other specified abnormal findings of blood chemistry: Secondary | ICD-10-CM | POA: Diagnosis present

## 2021-10-20 DIAGNOSIS — R002 Palpitations: Secondary | ICD-10-CM

## 2021-10-20 DIAGNOSIS — W19XXXA Unspecified fall, initial encounter: Secondary | ICD-10-CM

## 2021-10-20 LAB — CBC
HCT: 42.5 % (ref 39.0–52.0)
Hemoglobin: 14.6 g/dL (ref 13.0–17.0)
MCH: 32.5 pg (ref 26.0–34.0)
MCHC: 34.4 g/dL (ref 30.0–36.0)
MCV: 94.7 fL (ref 80.0–100.0)
Platelets: 397 10*3/uL (ref 150–400)
RBC: 4.49 MIL/uL (ref 4.22–5.81)
RDW: 14.2 % (ref 11.5–15.5)
WBC: 15 10*3/uL — ABNORMAL HIGH (ref 4.0–10.5)
nRBC: 0 % (ref 0.0–0.2)

## 2021-10-20 LAB — BASIC METABOLIC PANEL
Anion gap: 11 (ref 5–15)
BUN: 16 mg/dL (ref 6–20)
CO2: 24 mmol/L (ref 22–32)
Calcium: 9.1 mg/dL (ref 8.9–10.3)
Chloride: 105 mmol/L (ref 98–111)
Creatinine, Ser: 0.84 mg/dL (ref 0.61–1.24)
GFR, Estimated: >60 mL/min (ref >60)
Glucose, Bld: 110 mg/dL — ABNORMAL HIGH (ref 70–99)
Potassium: 3.8 mmol/L (ref 3.5–5.1)
Sodium: 140 mmol/L (ref 135–145)

## 2021-10-20 LAB — TROPONIN I (HIGH SENSITIVITY): Troponin I (High Sensitivity): 14 ng/L (ref <18)

## 2021-10-20 LAB — MAGNESIUM: Magnesium: 2.4 mg/dL (ref 1.7–2.4)

## 2021-10-20 MED ORDER — ADENOSINE 6 MG/2ML IV SOLN
INTRAVENOUS | Status: AC
  Filled 2021-10-20: qty 2

## 2021-10-20 MED ORDER — LIDOCAINE HCL (PF) 1 % IJ SOLN
INTRAMUSCULAR | Status: AC
  Administered 2021-10-21: 10 mL via SUBCUTANEOUS
  Filled 2021-10-20: qty 30

## 2021-10-20 MED ORDER — LIDOCAINE HCL (PF) 1 % IJ SOLN: 10.0000 mL | Freq: Once | INTRAMUSCULAR | Status: AC

## 2021-10-20 MED ORDER — LORAZEPAM 2 MG/ML IJ SOLN
1.0000 mg | Freq: Once | INTRAMUSCULAR | Status: AC
  Administered 2021-10-20: 1 mg via INTRAVENOUS
  Filled 2021-10-20: qty 1

## 2021-10-20 NOTE — ED Notes (Signed)
2cm laceration noted to left posterior forearm. Wound was cleansed. Gauze applied. [place] Heath Gerrianne Scale
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
## 2021-10-20 NOTE — ED Notes (Addendum)
Pt arrived to RM 14 in SVT with rate of 203-212. EDP He reports usage of fentanyl, cocaine, and meth today. Abrasion and hematoma noted to left face and superfical laceration to  R forehead. Pt was placed on Zoll pads and 6mg  of Adenosine given. Pt converted to NSR @ 2116 with rate of 98. EKG obtained and confirms Sinus Rhythm. 2117 Wardell Heath
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
## 2021-10-20 NOTE — ED Notes (Signed)
Pt transported to CT. BSuper,RN 

## 2021-10-21 ENCOUNTER — Other Ambulatory Visit: Payer: Self-pay

## 2021-10-21 DIAGNOSIS — I471 Supraventricular tachycardia: Secondary | ICD-10-CM | POA: Diagnosis not present

## 2021-10-21 DIAGNOSIS — F17200 Nicotine dependence, unspecified, uncomplicated: Secondary | ICD-10-CM | POA: Diagnosis not present

## 2021-10-21 DIAGNOSIS — K219 Gastro-esophageal reflux disease without esophagitis: Secondary | ICD-10-CM

## 2021-10-21 DIAGNOSIS — R778 Other specified abnormalities of plasma proteins: Secondary | ICD-10-CM | POA: Diagnosis not present

## 2021-10-21 DIAGNOSIS — D72829 Elevated white blood cell count, unspecified: Secondary | ICD-10-CM

## 2021-10-21 DIAGNOSIS — F1910 Other psychoactive substance abuse, uncomplicated: Secondary | ICD-10-CM

## 2021-10-21 DIAGNOSIS — F3160 Bipolar disorder, current episode mixed, unspecified: Secondary | ICD-10-CM

## 2021-10-21 LAB — RAPID URINE DRUG SCREEN, HOSP PERFORMED
Amphetamines: NOT DETECTED
Barbiturates: NOT DETECTED
Benzodiazepines: POSITIVE — AB
Cocaine: POSITIVE — AB
Opiates: NOT DETECTED
Tetrahydrocannabinol: POSITIVE — AB

## 2021-10-21 LAB — TROPONIN I (HIGH SENSITIVITY)
Troponin I (High Sensitivity): 37 ng/L — ABNORMAL HIGH (ref <18)
Troponin I (High Sensitivity): 44 ng/L — ABNORMAL HIGH (ref <18)
Troponin I (High Sensitivity): 39 ng/L — ABNORMAL HIGH (ref <18)

## 2021-10-21 MED ORDER — ACETAMINOPHEN 650 MG RE SUPP: 650.0000 mg | Freq: Four times a day (QID) | RECTAL | Status: DC | PRN

## 2021-10-21 MED ORDER — LEVALBUTEROL HCL 0.63 MG/3ML IN NEBU
0.6300 mg | INHALATION_SOLUTION | Freq: Four times a day (QID) | RESPIRATORY_TRACT | Status: DC | PRN
  Administered 2021-10-21: 0.63 mg via RESPIRATORY_TRACT
  Filled 2021-10-21: qty 3

## 2021-10-21 MED ORDER — MONTELUKAST SODIUM 10 MG PO TABS
10.0000 mg | ORAL_TABLET | Freq: Every day | ORAL | Status: DC
  Administered 2021-10-21: 10 mg via ORAL
  Filled 2021-10-21: qty 1

## 2021-10-21 MED ORDER — OLANZAPINE 5 MG PO TABS
5.0000 mg | ORAL_TABLET | Freq: Two times a day (BID) | ORAL | Status: DC
  Administered 2021-10-21 – 2021-10-22 (×3): 5 mg via ORAL
  Filled 2021-10-21 (×3): qty 1

## 2021-10-21 MED ORDER — OXYCODONE HCL 5 MG PO TABS: 5.0000 mg | ORAL_TABLET | ORAL | Status: DC | PRN

## 2021-10-21 MED ORDER — ONDANSETRON HCL 4 MG/2ML IJ SOLN: 4.0000 mg | Freq: Four times a day (QID) | INTRAMUSCULAR | Status: DC | PRN

## 2021-10-21 MED ORDER — BACITRACIN-NEOMYCIN-POLYMYXIN OINTMENT TUBE
TOPICAL_OINTMENT | Freq: Every day | CUTANEOUS | Status: DC
  Filled 2021-10-21: qty 14.17

## 2021-10-21 MED ORDER — INFLUENZA VAC SPLIT QUAD 0.5 ML IM SUSY
0.5000 mL | PREFILLED_SYRINGE | INTRAMUSCULAR | Status: AC
  Administered 2021-10-22: 0.5 mL via INTRAMUSCULAR
  Filled 2021-10-21: qty 0.5

## 2021-10-21 MED ORDER — IPRATROPIUM-ALBUTEROL 0.5-2.5 (3) MG/3ML IN SOLN
3.0000 mL | Freq: Once | RESPIRATORY_TRACT | Status: AC
  Administered 2021-10-21: 3 mL via RESPIRATORY_TRACT
  Filled 2021-10-21: qty 3

## 2021-10-21 MED ORDER — IPRATROPIUM BROMIDE 0.02 % IN SOLN
RESPIRATORY_TRACT | Status: AC
  Administered 2021-10-21: 0.5 mg
  Filled 2021-10-21: qty 2.5

## 2021-10-21 MED ORDER — PANTOPRAZOLE SODIUM 40 MG PO TBEC
40.0000 mg | DELAYED_RELEASE_TABLET | Freq: Every day | ORAL | Status: DC
  Administered 2021-10-21 – 2021-10-22 (×2): 40 mg via ORAL
  Filled 2021-10-21 (×2): qty 1

## 2021-10-21 MED ORDER — ONDANSETRON HCL 4 MG PO TABS: 4.0000 mg | ORAL_TABLET | Freq: Four times a day (QID) | ORAL | Status: DC | PRN

## 2021-10-21 MED ORDER — ACETAMINOPHEN 325 MG PO TABS
650.0000 mg | ORAL_TABLET | Freq: Four times a day (QID) | ORAL | Status: DC | PRN
  Administered 2021-10-21: 650 mg via ORAL
  Filled 2021-10-21: qty 2

## 2021-10-21 MED ORDER — HEPARIN SODIUM (PORCINE) 5000 UNIT/ML IJ SOLN
5000.0000 [IU] | Freq: Three times a day (TID) | INTRAMUSCULAR | Status: DC
  Administered 2021-10-21 – 2021-10-22 (×4): 5000 [IU] via SUBCUTANEOUS
  Filled 2021-10-21 (×4): qty 1

## 2021-10-21 MED ORDER — NICOTINE 21 MG/24HR TD PT24
21.0000 mg | MEDICATED_PATCH | Freq: Every day | TRANSDERMAL | Status: DC
  Administered 2021-10-21 – 2021-10-22 (×2): 21 mg via TRANSDERMAL
  Filled 2021-10-21 (×2): qty 1
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
## 2021-10-21 NOTE — Assessment & Plan Note (Signed)
-   Troponin initially 37, uptrending to 44 and then downtrending to 39 - Likely demand ischemia given heart rate of 207 at presentation - Now that SVT is corrected troponin is trending back down - No chest pain - EKG is without acute ischemic changes - Monitor on telemetry

## 2021-10-21 NOTE — Assessment & Plan Note (Signed)
-   Patient reports using cocaine, possibly fentanyl, and marijuana - Consult TOC for substance abuse counseling - Advised patient on the importance of cocaine cessation given SVT and elevated troponin - BX pending - Continue to monitor

## 2021-10-21 NOTE — Assessment & Plan Note (Signed)
-   Continue Zyprexa - Continue to monitor

## 2021-10-21 NOTE — Care Management Obs Status (Signed)
MEDICARE OBSERVATION STATUS NOTIFICATION   Patient Details  Name: Wesley Beck MRN: [id number] Date of Birth: 1970/02/11   Medicare Observation Status Notification Given:  Yes    Corey Harold 10/21/2021, 4:30 PM

## 2021-10-21 NOTE — Assessment & Plan Note (Signed)
-   1 to 2 packs/day - Start nicotine patch - Counseled on importance of cessation

## 2021-10-21 NOTE — Assessment & Plan Note (Signed)
Continue Protonix °

## 2021-10-21 NOTE — Assessment & Plan Note (Addendum)
-   Heart rate 207 on arrival - Patient was given 1 dose of adenosine 6 mg - Patient has been in sinus rhythm since - OBSERVE on telemetry - Likely triggered by cocaine use
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
## 2021-10-21 NOTE — TOC Initial Note (Signed)
Transition of Care [facility]) - Initial/Assessment Note    Patient Details  Name: Wesley Beck MRN: [id number] Date of Birth: 02-13-1970  Transition of Care [facility]) CM/SW Contact:    Elliot Gault, LCSW Phone Number: 10/21/2021, 1:23 PM  Clinical Narrative:                  Pt admitted from home. Received TOC consult for SA treatment resources and community resource information. Spoke with pt today to assess. Pt agreeable to receive SA treatment resource information. TOC also offered other community resource information and this will be provided to pt today. TOC also updated pt on process to contact APS for concerns he has related to his parents.   Pt did not have any other TOC needs.  Expected Discharge Plan: Home/Self Care Barriers to Discharge: Continued Medical Work up   Patient Goals and CMS Choice Patient states their goals for this hospitalization and ongoing recovery are:: go home      Expected Discharge Plan and Services Expected Discharge Plan: Home/Self Care In-house Referral: Clinical Social Work     Living arrangements for the past 2 months: Single Family Home                                      Prior Living Arrangements/Services Living arrangements for the past 2 months: Single Family Home Lives with:: Self Patient language and need for interpreter reviewed:: Yes Do you feel safe going back to the place where you live?: Yes      Need for Family Participation in Patient Care: No (Comment)     Criminal Activity/Legal Involvement Pertinent to Current Situation/Hospitalization: No - Comment as needed  Activities of Daily Living Home Assistive Devices/Equipment: None ADL Screening (condition at time of admission) Patient's cognitive ability adequate to safely complete daily activities?: Yes Is the patient deaf or have difficulty hearing?: No Does the patient have difficulty seeing, even when wearing glasses/contacts?: No Does the patient have  difficulty concentrating, remembering, or making decisions?: No Patient able to express need for assistance with ADLs?: Yes Does the patient have difficulty dressing or bathing?: No Independently performs ADLs?: Yes (appropriate for developmental age) Does the patient have difficulty walking or climbing stairs?: No Weakness of Legs: None Weakness of Arms/Hands: None  Permission Sought/Granted                  Emotional Assessment       Orientation: : Oriented to Self, Oriented to Place, Oriented to  Time, Oriented to Situation Alcohol / Substance Use: Illicit Drugs Psych Involvement: No (comment)  Admission diagnosis:  Elevated troponin [R77.8] Patient Active Problem List   Diagnosis Date Noted   Elevated troponin 10/21/2021   Substance abuse (HCC) 10/21/2021   GERD (gastroesophageal reflux disease) 10/21/2021   SVT (supraventricular tachycardia) (HCC) 10/21/2021   Leukocytosis 10/21/2021   Hypoxia    Acute respiratory failure with hypoxia and hypercarbia (HCC)    Respiratory failure (HCC) 10/07/2021   Schizoaffective disorder (HCC) 04/03/2018   Cannabis abuse 04/03/2018   Suicidal behavior with attempted self-injury (HCC) 04/02/2018   Bipolar 1 disorder, mixed (HCC) 02/28/2013   DISORDER, BIPOLAR NOS 06/05/2006   TOBACCO ABUSE 06/05/2006   ALLERGIC RHINITIS 06/05/2006   Asthma 06/05/2006   SEBACEOUS CYST 06/05/2006   LOW BACK PAIN, CHRONIC 06/05/2006   MALAISE AND FATIGUE 06/05/2006   PCP:  Benetta Spar, MD Pharmacy:  [facility] [phone] - [place], [place] - [address] AT [facility] [address] & [address] [place] [address] Phone: [phone] Fax: [phone]  [facility] [address] [place] [address] Phone: [phone] Fax: [phone]     Social Determinants of Health (SDOH) Interventions Housing Interventions: Other (Comment) (lives in buidling behind father, no  running water)  Readmission Risk Interventions     No data to display

## 2021-10-21 NOTE — Assessment & Plan Note (Signed)
-   Likely reactive given injuries and SVT - No infectious symptoms - Trend in the a.m.

## 2021-10-22 DIAGNOSIS — I471 Supraventricular tachycardia: Secondary | ICD-10-CM | POA: Diagnosis not present

## 2021-10-22 DIAGNOSIS — F3160 Bipolar disorder, current episode mixed, unspecified: Secondary | ICD-10-CM | POA: Diagnosis not present

## 2021-10-22 LAB — MAGNESIUM: Magnesium: 2.2 mg/dL (ref 1.7–2.4)

## 2021-10-22 LAB — CBC WITH DIFFERENTIAL/PLATELET
Abs Immature Granulocytes: 0.09 10*3/uL — ABNORMAL HIGH (ref 0.00–0.07)
Basophils Absolute: 0.1 10*3/uL (ref 0.0–0.1)
Basophils Relative: 1 %
Eosinophils Absolute: 0.3 10*3/uL (ref 0.0–0.5)
Eosinophils Relative: 5 %
HCT: 38.2 % — ABNORMAL LOW (ref 39.0–52.0)
Hemoglobin: 12.9 g/dL — ABNORMAL LOW (ref 13.0–17.0)
Immature Granulocytes: 1 %
Lymphocytes Relative: 27 %
Lymphs Abs: 1.7 10*3/uL (ref 0.7–4.0)
MCH: 32.3 pg (ref 26.0–34.0)
MCHC: 33.8 g/dL (ref 30.0–36.0)
MCV: 95.7 fL (ref 80.0–100.0)
Monocytes Absolute: 0.7 10*3/uL (ref 0.1–1.0)
Monocytes Relative: 11 %
Neutro Abs: 3.5 10*3/uL (ref 1.7–7.7)
Neutrophils Relative %: 55 %
Platelets: 302 10*3/uL (ref 150–400)
RBC: 3.99 MIL/uL — ABNORMAL LOW (ref 4.22–5.81)
RDW: 13.8 % (ref 11.5–15.5)
WBC: 6.4 10*3/uL (ref 4.0–10.5)
nRBC: 0 % (ref 0.0–0.2)

## 2021-10-22 LAB — TSH: TSH: 1.883 u[IU]/mL (ref 0.350–4.500)

## 2021-10-22 LAB — HIV ANTIBODY (ROUTINE TESTING W REFLEX): HIV Screen 4th Generation wRfx: NONREACTIVE

## 2021-10-22 NOTE — Discharge Instructions (Addendum)
Wesley Beck,  You were in the hospital because of an extremely fast heart rate. This was treated. Please refrain from using cocaine as this can make the problem recur, in addition to causing other issues. Please follow-up with your primary care physician. You will need to have your suture removed in about 7-10 days; please see your PCP about this or return to the emergency department to have it removed.

## 2021-10-22 NOTE — Discharge Summary (Signed)
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 adjustment of the mA and/or kV according to patient size and/or use of iterative reconstruction technique. COMPARISON:  None Available. FINDINGS: Brain: Normal anatomic configuration. No abnormal intra or extra-axial mass lesion or fluid collection. No abnormal mass effect or midline shift. No evidence of acute intracranial hemorrhage or infarct. Ventricular size is normal. Cerebellum unremarkable. Vascular: Unremarkable Skull: Intact Sinuses/Orbits: Paranasal sinuses are clear. Orbits are unremarkable. Other: Mastoid air cells and middle ear cavities are clear. IMPRESSION: No acute intracranial abnormality. Electronically Signed   By: Fidela Salisbury M.D.   On: 10/07/2021 21:29   DG Chest 1 View  Result Date: 10/07/2021 CLINICAL DATA:  Status post intubation. Shortness of breath. Diaphoresis. EXAM: CHEST  1 VIEW COMPARISON:  06/22/21 FINDINGS: ET tube tip is in satisfactory position above the carina. There is a nasogastric tube with tip and side port below the GE junction. Heart size and mediastinal contours appear normal. No pleural effusion or  edema identified. No airspace opacities. The visualized osseous structures are unremarkable. IMPRESSION: Satisfactory position of ET tube and nasogastric tube. Lungs are clear. Electronically Signed   By: Kerby Moors M.D.   On: 10/07/2021 18:41    Microbiology: Results for orders placed or performed during the hospital encounter of 10/07/21  Resp Panel by RT-PCR (Flu A&B, Covid)  Anterior Nasal Swab     Status: None   Collection Time: 10/07/21  6:12 PM   Specimen: Anterior Nasal Swab  Result Value Ref Range Status   SARS Coronavirus 2 by RT PCR NEGATIVE NEGATIVE Final    Comment: (NOTE) SARS-CoV-2 target nucleic acids are NOT DETECTED.  The SARS-CoV-2 RNA is generally detectable in upper respiratory specimens during the acute phase of infection. The lowest concentration of SARS-CoV-2 viral copies this assay can detect is 138 copies/mL. A negative result does not preclude SARS-Cov-2 infection and should not be used as the sole basis for treatment or other patient management decisions. A negative result may occur with  improper specimen collection/handling, submission of specimen other than nasopharyngeal swab, presence of viral mutation(s) within the areas targeted by this assay, and inadequate number of viral copies(<138 copies/mL). A negative result must be combined with clinical observations, patient history, and epidemiological information. The expected result is Negative.  Fact Sheet for Patients:  EntrepreneurPulse.com.au  Fact Sheet for Healthcare Providers:  IncredibleEmployment.be  This test is no t yet approved or cleared by the [place] FDA and  has been authorized for detection and/or diagnosis of SARS-CoV-2 by FDA under an Emergency Use Authorization (EUA). This EUA will remain  in effect (meaning this test can be used) for the duration of the COVID-19 declaration under Section 564(b)(1) of the Act, 21 U.S.C.section  360bbb-3(b)(1), unless the authorization is terminated  or revoked sooner.       Influenza A by PCR NEGATIVE NEGATIVE Final   Influenza B by PCR NEGATIVE NEGATIVE Final    Comment: (NOTE) The Xpert Xpress SARS-CoV-2/FLU/RSV plus assay is intended as an aid in the diagnosis of influenza from Nasopharyngeal swab specimens and should not be used as a sole basis for treatment. Nasal washings and aspirates are unacceptable for Xpert Xpress SARS-CoV-2/FLU/RSV testing.  Fact Sheet for Patients: EntrepreneurPulse.com.au  Fact Sheet for Healthcare Providers: IncredibleEmployment.be  This test is not yet approved or cleared by the [place] FDA and has been authorized for detection and/or diagnosis of SARS-CoV-2 by FDA under an Emergency Use Authorization (EUA). This EUA will remain in effect (meaning this test can be used) for the duration of the COVID-19 declaration under Section 564(b)(1) of the Act, 21 U.S.C. section 360bbb-3(b)(1), unless the authorization is terminated or revoked.  Performed at [facility], [address] [place], [place] [address]   MRSA Next Gen by PCR, Nasal     Status: None   Collection Time: 10/08/21 12:13 AM   Specimen: Nasal Mucosa; Nasal Swab  Result Value Ref Range Status   MRSA by PCR Next Gen NOT DETECTED NOT DETECTED Final    Comment: (NOTE) The GeneXpert MRSA Assay (FDA approved for NASAL specimens only), is one component of a comprehensive MRSA colonization surveillance program. It is not intended to diagnose MRSA infection nor to guide or monitor treatment for MRSA infections. Test performance is not FDA approved in patients less than 58 years old. Performed at [facility], [place] [address], [place], [place] [address]     Labs: CBC: Recent Labs  Lab 10/20/21 2106 10/22/21 0619  WBC 15.0* 6.4  NEUTROABS  --  3.5  HGB 14.6 12.9*  HCT 42.5 38.2*  MCV 94.7 95.7  PLT 397 295   Basic  Metabolic Panel: Recent Labs  Lab 10/20/21 2106 10/22/21 0619  NA 140  --   K 3.8  --   CL 105  --   CO2 24  --  GLUCOSE 110*  --   BUN 16  --   CREATININE 0.84  --   CALCIUM 9.1  --   MG 2.4 2.2    Discharge time spent: 35 minutes.  Signed: Cordelia Poche, MD [facility] 10/22/2021
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
## 2021-11-03 DIAGNOSIS — R000 Tachycardia, unspecified: Secondary | ICD-10-CM | POA: Diagnosis not present

## 2021-11-03 DIAGNOSIS — F17210 Nicotine dependence, cigarettes, uncomplicated: Secondary | ICD-10-CM | POA: Diagnosis not present

## 2021-11-03 DIAGNOSIS — J449 Chronic obstructive pulmonary disease, unspecified: Secondary | ICD-10-CM | POA: Diagnosis not present

## 2021-12-03 DIAGNOSIS — J449 Chronic obstructive pulmonary disease, unspecified: Secondary | ICD-10-CM | POA: Diagnosis not present

## 2021-12-03 DIAGNOSIS — R000 Tachycardia, unspecified: Secondary | ICD-10-CM | POA: Diagnosis not present

## 2022-01-03 ENCOUNTER — Other Ambulatory Visit: Payer: Self-pay

## 2022-01-03 DIAGNOSIS — J449 Chronic obstructive pulmonary disease, unspecified: Secondary | ICD-10-CM | POA: Diagnosis not present

## 2022-02-02 DIAGNOSIS — J449 Chronic obstructive pulmonary disease, unspecified: Secondary | ICD-10-CM | POA: Diagnosis not present

## 2022-03-06 DIAGNOSIS — J449 Chronic obstructive pulmonary disease, unspecified: Secondary | ICD-10-CM | POA: Diagnosis not present

## 2022-03-06 DIAGNOSIS — F17210 Nicotine dependence, cigarettes, uncomplicated: Secondary | ICD-10-CM | POA: Diagnosis not present

## 2022-03-06 DIAGNOSIS — G4700 Insomnia, unspecified: Secondary | ICD-10-CM | POA: Diagnosis not present

## 2022-03-06 DIAGNOSIS — M130 Polyarthritis, unspecified: Secondary | ICD-10-CM | POA: Diagnosis not present

## 2022-04-06 DIAGNOSIS — J449 Chronic obstructive pulmonary disease, unspecified: Secondary | ICD-10-CM | POA: Diagnosis not present

## 2022-04-27 ENCOUNTER — Other Ambulatory Visit: Payer: Self-pay

## 2022-05-04 DIAGNOSIS — J449 Chronic obstructive pulmonary disease, unspecified: Secondary | ICD-10-CM | POA: Diagnosis not present

## 2022-05-04 DIAGNOSIS — F17200 Nicotine dependence, unspecified, uncomplicated: Secondary | ICD-10-CM | POA: Diagnosis not present

## 2022-05-04 DIAGNOSIS — F17210 Nicotine dependence, cigarettes, uncomplicated: Secondary | ICD-10-CM | POA: Diagnosis not present

## 2022-05-04 DIAGNOSIS — M109 Gout, unspecified: Secondary | ICD-10-CM | POA: Diagnosis not present

## 2022-05-04 DIAGNOSIS — G4700 Insomnia, unspecified: Secondary | ICD-10-CM | POA: Diagnosis not present

## 2022-05-04 DIAGNOSIS — M130 Polyarthritis, unspecified: Secondary | ICD-10-CM | POA: Diagnosis not present

## 2022-05-04 DIAGNOSIS — Z0001 Encounter for general adult medical examination with abnormal findings: Secondary | ICD-10-CM | POA: Diagnosis not present

## 2022-05-04 DIAGNOSIS — R000 Tachycardia, unspecified: Secondary | ICD-10-CM | POA: Diagnosis not present

## 2022-05-04 DIAGNOSIS — Z1389 Encounter for screening for other disorder: Secondary | ICD-10-CM | POA: Diagnosis not present

## 2022-05-08 ENCOUNTER — Other Ambulatory Visit (HOSPITAL_COMMUNITY): Payer: Self-pay | Admitting: Internal Medicine

## 2022-05-08 ENCOUNTER — Ambulatory Visit (HOSPITAL_COMMUNITY)
Admission: RE | Admit: 2022-05-08 | Discharge: 2022-05-08 | Disposition: A | Discharge status: Home / Self Care | Payer: 59 | Source: Ambulatory Visit | Attending: Internal Medicine | Admitting: Internal Medicine

## 2022-05-08 ENCOUNTER — Other Ambulatory Visit (HOSPITAL_COMMUNITY)
Admission: RE | Admit: 2022-05-08 | Discharge: 2022-05-08 | Disposition: A | Discharge status: Home / Self Care | Payer: 59 | Source: Ambulatory Visit | Attending: Internal Medicine | Admitting: Internal Medicine

## 2022-05-08 DIAGNOSIS — I10 Essential (primary) hypertension: Secondary | ICD-10-CM | POA: Diagnosis not present

## 2022-05-08 DIAGNOSIS — R059 Cough, unspecified: Secondary | ICD-10-CM

## 2022-05-08 DIAGNOSIS — J449 Chronic obstructive pulmonary disease, unspecified: Secondary | ICD-10-CM | POA: Diagnosis not present

## 2022-05-08 DIAGNOSIS — J439 Emphysema, unspecified: Secondary | ICD-10-CM | POA: Diagnosis not present

## 2022-05-08 DIAGNOSIS — R0602 Shortness of breath: Secondary | ICD-10-CM | POA: Diagnosis not present

## 2022-05-08 LAB — HEPATIC FUNCTION PANEL
ALT: 35 U/L (ref 0–44)
AST: 56 U/L — ABNORMAL HIGH (ref 15–41)
Albumin: 4.2 g/dL (ref 3.5–5.0)
Alkaline Phosphatase: 70 U/L (ref 38–126)
Bilirubin, Direct: 0.2 mg/dL (ref 0.0–0.2)
Indirect Bilirubin: 0.8 mg/dL (ref 0.3–0.9)
Total Bilirubin: 1 mg/dL (ref 0.3–1.2)
Total Protein: 6.9 g/dL (ref 6.5–8.1)

## 2022-05-08 LAB — CBC WITH DIFFERENTIAL/PLATELET
Abs Immature Granulocytes: 0.02 10*3/uL (ref 0.00–0.07)
Basophils Absolute: 0.1 10*3/uL (ref 0.0–0.1)
Basophils Relative: 2 %
Eosinophils Absolute: 0.8 10*3/uL — ABNORMAL HIGH (ref 0.0–0.5)
Eosinophils Relative: 12 %
HCT: 42.3 % (ref 39.0–52.0)
Hemoglobin: 14.4 g/dL (ref 13.0–17.0)
Immature Granulocytes: 0 %
Lymphocytes Relative: 22 %
Lymphs Abs: 1.5 10*3/uL (ref 0.7–4.0)
MCH: 32.9 pg (ref 26.0–34.0)
MCHC: 34 g/dL (ref 30.0–36.0)
MCV: 96.6 fL (ref 80.0–100.0)
Monocytes Absolute: 0.8 10*3/uL (ref 0.1–1.0)
Monocytes Relative: 12 %
Neutro Abs: 3.7 10*3/uL (ref 1.7–7.7)
Neutrophils Relative %: 52 %
Platelets: 259 10*3/uL (ref 150–400)
RBC: 4.38 MIL/uL (ref 4.22–5.81)
RDW: 13.4 % (ref 11.5–15.5)
WBC: 7 10*3/uL (ref 4.0–10.5)
nRBC: 0 % (ref 0.0–0.2)

## 2022-05-08 LAB — LIPID PANEL
Cholesterol: 114 mg/dL (ref 0–200)
HDL: 40 mg/dL — ABNORMAL LOW (ref >40)
LDL Cholesterol: 63 mg/dL (ref 0–99)
Total CHOL/HDL Ratio: 2.9 RATIO
Triglycerides: 55 mg/dL (ref <150)
VLDL: 11 mg/dL (ref 0–40)

## 2022-05-08 LAB — BASIC METABOLIC PANEL
Anion gap: 10 (ref 5–15)
BUN: 17 mg/dL (ref 6–20)
CO2: 23 mmol/L (ref 22–32)
Calcium: 9 mg/dL (ref 8.9–10.3)
Chloride: 104 mmol/L (ref 98–111)
Creatinine, Ser: 0.92 mg/dL (ref 0.61–1.24)
GFR, Estimated: >60 mL/min (ref >60)
Glucose, Bld: 88 mg/dL (ref 70–99)
Potassium: 4.1 mmol/L (ref 3.5–5.1)
Sodium: 137 mmol/L (ref 135–145)

## 2022-10-11 ENCOUNTER — Other Ambulatory Visit: Payer: Self-pay

## 2023-01-10 DIAGNOSIS — J449 Chronic obstructive pulmonary disease, unspecified: Secondary | ICD-10-CM | POA: Diagnosis not present

## 2023-01-10 DIAGNOSIS — F17210 Nicotine dependence, cigarettes, uncomplicated: Secondary | ICD-10-CM | POA: Diagnosis not present

## 2023-01-10 DIAGNOSIS — E7849 Other hyperlipidemia: Secondary | ICD-10-CM | POA: Diagnosis not present

## 2023-01-22 DIAGNOSIS — F17210 Nicotine dependence, cigarettes, uncomplicated: Secondary | ICD-10-CM | POA: Diagnosis not present

## 2023-01-22 DIAGNOSIS — Z5900 Homelessness unspecified: Secondary | ICD-10-CM | POA: Diagnosis not present

## 2023-02-10 DIAGNOSIS — J449 Chronic obstructive pulmonary disease, unspecified: Secondary | ICD-10-CM | POA: Diagnosis not present

## 2023-03-13 DIAGNOSIS — J449 Chronic obstructive pulmonary disease, unspecified: Secondary | ICD-10-CM | POA: Diagnosis not present

## 2023-04-10 DIAGNOSIS — J449 Chronic obstructive pulmonary disease, unspecified: Secondary | ICD-10-CM | POA: Diagnosis not present

## 2023-04-29 DIAGNOSIS — T148XXA Other injury of unspecified body region, initial encounter: Secondary | ICD-10-CM | POA: Diagnosis not present

## 2023-04-29 DIAGNOSIS — Z1152 Encounter for screening for COVID-19: Secondary | ICD-10-CM | POA: Diagnosis not present

## 2023-04-29 DIAGNOSIS — Z5900 Homelessness unspecified: Secondary | ICD-10-CM | POA: Diagnosis not present

## 2023-04-29 DIAGNOSIS — Z23 Encounter for immunization: Secondary | ICD-10-CM | POA: Diagnosis not present

## 2023-04-29 DIAGNOSIS — S91152A Open bite of left great toe without damage to nail, initial encounter: Secondary | ICD-10-CM | POA: Diagnosis not present

## 2023-04-29 DIAGNOSIS — W5581XA Bitten by other mammals, initial encounter: Secondary | ICD-10-CM | POA: Diagnosis not present

## 2023-04-29 DIAGNOSIS — I4510 Unspecified right bundle-branch block: Secondary | ICD-10-CM | POA: Diagnosis not present

## 2023-04-29 DIAGNOSIS — I10 Essential (primary) hypertension: Secondary | ICD-10-CM | POA: Diagnosis not present

## 2023-04-29 DIAGNOSIS — S99922A Unspecified injury of left foot, initial encounter: Secondary | ICD-10-CM | POA: Diagnosis not present

## 2023-05-02 DIAGNOSIS — Z87898 Personal history of other specified conditions: Secondary | ICD-10-CM | POA: Diagnosis not present

## 2023-05-02 DIAGNOSIS — Z7189 Other specified counseling: Secondary | ICD-10-CM | POA: Diagnosis not present

## 2023-05-02 DIAGNOSIS — R062 Wheezing: Secondary | ICD-10-CM | POA: Diagnosis not present

## 2023-05-02 DIAGNOSIS — Z13228 Encounter for screening for other metabolic disorders: Secondary | ICD-10-CM | POA: Diagnosis not present

## 2023-05-02 DIAGNOSIS — Z1211 Encounter for screening for malignant neoplasm of colon: Secondary | ICD-10-CM | POA: Diagnosis not present

## 2023-05-02 DIAGNOSIS — W5911XA Bitten by nonvenomous snake, initial encounter: Secondary | ICD-10-CM | POA: Diagnosis not present

## 2023-05-04 DIAGNOSIS — J449 Chronic obstructive pulmonary disease, unspecified: Secondary | ICD-10-CM | POA: Diagnosis not present

## 2023-05-04 DIAGNOSIS — F17210 Nicotine dependence, cigarettes, uncomplicated: Secondary | ICD-10-CM | POA: Diagnosis not present

## 2023-05-04 DIAGNOSIS — W5911XD Bitten by nonvenomous snake, subsequent encounter: Secondary | ICD-10-CM | POA: Diagnosis not present

## 2023-06-01 DIAGNOSIS — G4700 Insomnia, unspecified: Secondary | ICD-10-CM | POA: Diagnosis not present

## 2023-06-01 DIAGNOSIS — Z0001 Encounter for general adult medical examination with abnormal findings: Secondary | ICD-10-CM | POA: Diagnosis not present

## 2023-06-01 DIAGNOSIS — Z1389 Encounter for screening for other disorder: Secondary | ICD-10-CM | POA: Diagnosis not present

## 2023-06-01 DIAGNOSIS — F17200 Nicotine dependence, unspecified, uncomplicated: Secondary | ICD-10-CM | POA: Diagnosis not present

## 2023-06-01 DIAGNOSIS — J449 Chronic obstructive pulmonary disease, unspecified: Secondary | ICD-10-CM | POA: Diagnosis not present

## 2023-06-01 DIAGNOSIS — F17210 Nicotine dependence, cigarettes, uncomplicated: Secondary | ICD-10-CM | POA: Diagnosis not present

## 2023-06-01 DIAGNOSIS — M109 Gout, unspecified: Secondary | ICD-10-CM | POA: Diagnosis not present

## 2023-06-01 DIAGNOSIS — E7849 Other hyperlipidemia: Secondary | ICD-10-CM | POA: Diagnosis not present

## 2023-06-01 DIAGNOSIS — M130 Polyarthritis, unspecified: Secondary | ICD-10-CM | POA: Diagnosis not present

## 2023-06-20 ENCOUNTER — Other Ambulatory Visit (HOSPITAL_COMMUNITY): Payer: Self-pay | Admitting: Gerontology

## 2023-06-20 DIAGNOSIS — Z87891 Personal history of nicotine dependence: Secondary | ICD-10-CM

## 2023-07-01 DIAGNOSIS — J449 Chronic obstructive pulmonary disease, unspecified: Secondary | ICD-10-CM | POA: Diagnosis not present

## 2023-07-14 ENCOUNTER — Emergency Department (HOSPITAL_COMMUNITY)
Admission: EM | Admit: 2023-07-14 | Discharge: 2023-07-15 | Discharge status: Against Medical Advice | Attending: Emergency Medicine | Admitting: Emergency Medicine

## 2023-07-14 ENCOUNTER — Encounter (HOSPITAL_COMMUNITY): Payer: Self-pay

## 2023-07-14 ENCOUNTER — Other Ambulatory Visit: Payer: Self-pay

## 2023-07-14 DIAGNOSIS — J45909 Unspecified asthma, uncomplicated: Secondary | ICD-10-CM | POA: Diagnosis not present

## 2023-07-14 DIAGNOSIS — F1290 Cannabis use, unspecified, uncomplicated: Secondary | ICD-10-CM | POA: Diagnosis not present

## 2023-07-14 DIAGNOSIS — F319 Bipolar disorder, unspecified: Secondary | ICD-10-CM | POA: Diagnosis not present

## 2023-07-14 DIAGNOSIS — F29 Unspecified psychosis not due to a substance or known physiological condition: Secondary | ICD-10-CM

## 2023-07-14 DIAGNOSIS — F3189 Other bipolar disorder: Secondary | ICD-10-CM | POA: Diagnosis not present

## 2023-07-14 DIAGNOSIS — Z5900 Homelessness unspecified: Secondary | ICD-10-CM | POA: Insufficient documentation

## 2023-07-14 DIAGNOSIS — F200 Paranoid schizophrenia: Secondary | ICD-10-CM | POA: Diagnosis not present

## 2023-07-14 DIAGNOSIS — F17210 Nicotine dependence, cigarettes, uncomplicated: Secondary | ICD-10-CM | POA: Insufficient documentation

## 2023-07-14 DIAGNOSIS — I10 Essential (primary) hypertension: Secondary | ICD-10-CM | POA: Diagnosis not present

## 2023-07-14 DIAGNOSIS — Z5329 Procedure and treatment not carried out because of patient's decision for other reasons: Secondary | ICD-10-CM | POA: Insufficient documentation

## 2023-07-14 DIAGNOSIS — F22 Delusional disorders: Secondary | ICD-10-CM

## 2023-07-14 LAB — CBC WITH DIFFERENTIAL/PLATELET
Abs Immature Granulocytes: 0.02 10*3/uL (ref 0.00–0.07)
Basophils Absolute: 0.1 10*3/uL (ref 0.0–0.1)
Basophils Relative: 2 %
Eosinophils Absolute: 0.7 10*3/uL — ABNORMAL HIGH (ref 0.0–0.5)
Eosinophils Relative: 10 %
HCT: 42 % (ref 39.0–52.0)
Hemoglobin: 14.8 g/dL (ref 13.0–17.0)
Immature Granulocytes: 0 %
Lymphocytes Relative: 23 %
Lymphs Abs: 1.6 10*3/uL (ref 0.7–4.0)
MCH: 32.6 pg (ref 26.0–34.0)
MCHC: 35.2 g/dL (ref 30.0–36.0)
MCV: 92.5 fL (ref 80.0–100.0)
Monocytes Absolute: 0.8 10*3/uL (ref 0.1–1.0)
Monocytes Relative: 11 %
Neutro Abs: 3.8 10*3/uL (ref 1.7–7.7)
Neutrophils Relative %: 54 %
Platelets: 314 10*3/uL (ref 150–400)
RBC: 4.54 MIL/uL (ref 4.22–5.81)
RDW: 12.5 % (ref 11.5–15.5)
WBC: 6.9 10*3/uL (ref 4.0–10.5)
nRBC: 0 % (ref 0.0–0.2)

## 2023-07-14 LAB — COMPREHENSIVE METABOLIC PANEL WITH GFR
ALT: 49 U/L — ABNORMAL HIGH (ref 0–44)
AST: 63 U/L — ABNORMAL HIGH (ref 15–41)
Albumin: 4.4 g/dL (ref 3.5–5.0)
Alkaline Phosphatase: 89 U/L (ref 38–126)
Anion gap: 12 (ref 5–15)
BUN: 11 mg/dL (ref 6–20)
CO2: 22 mmol/L (ref 22–32)
Calcium: 9.4 mg/dL (ref 8.9–10.3)
Chloride: 104 mmol/L (ref 98–111)
Creatinine, Ser: 0.95 mg/dL (ref 0.61–1.24)
GFR, Estimated: >60 mL/min (ref >60)
Glucose, Bld: 98 mg/dL (ref 70–99)
Potassium: 3.5 mmol/L (ref 3.5–5.1)
Sodium: 138 mmol/L (ref 135–145)
Total Bilirubin: 1.1 mg/dL (ref 0.0–1.2)
Total Protein: 7.2 g/dL (ref 6.5–8.1)

## 2023-07-14 LAB — RAPID URINE DRUG SCREEN, HOSP PERFORMED
Amphetamines: NOT DETECTED
Barbiturates: NOT DETECTED
Benzodiazepines: NOT DETECTED
Cocaine: NOT DETECTED
Opiates: NOT DETECTED
Tetrahydrocannabinol: POSITIVE — AB

## 2023-07-14 LAB — ACETAMINOPHEN LEVEL: Acetaminophen (Tylenol), Serum: <10 ug/mL — ABNORMAL LOW (ref 10–30)

## 2023-07-14 LAB — ETHANOL: Alcohol, Ethyl (B): <15 mg/dL (ref <15)

## 2023-07-14 LAB — SALICYLATE LEVEL: Salicylate Lvl: <7 mg/dL — ABNORMAL LOW (ref 7.0–30.0)

## 2023-07-14 MED ORDER — GABAPENTIN 100 MG PO CAPS
200.0000 mg | ORAL_CAPSULE | Freq: Once | ORAL | Status: AC
  Administered 2023-07-14: 200 mg via ORAL
  Filled 2023-07-14: qty 2

## 2023-07-14 MED ORDER — ALUM & MAG HYDROXIDE-SIMETH 200-200-20 MG/5ML PO SUSP: 30.0000 mL | Freq: Four times a day (QID) | ORAL | Status: DC | PRN

## 2023-07-14 MED ORDER — OLANZAPINE 5 MG PO TABS
5.0000 mg | ORAL_TABLET | Freq: Every day | ORAL | Status: DC
  Administered 2023-07-14: 5 mg via ORAL
  Filled 2023-07-14: qty 1

## 2023-07-14 MED ORDER — ACETAMINOPHEN 325 MG PO TABS: 650.0000 mg | ORAL_TABLET | ORAL | Status: DC | PRN

## 2023-07-14 MED ORDER — LORAZEPAM 1 MG PO TABS
1.0000 mg | ORAL_TABLET | Freq: Once | ORAL | Status: AC
  Administered 2023-07-14: 1 mg via ORAL
  Filled 2023-07-14: qty 1

## 2023-07-14 MED ORDER — ONDANSETRON HCL 4 MG PO TABS: 4.0000 mg | ORAL_TABLET | Freq: Three times a day (TID) | ORAL | Status: DC | PRN

## 2023-07-14 NOTE — ED Triage Notes (Signed)
 Pt bib officer, pt asked what brings him in today he replied " I walked as far as I could go" . Pt states that he hasn't been taking his medication. Pt denies drinking, but states that he took a "B 100" pill this morning. Does not appear to be in any acute distress, answers question calmly but answers are sometimes irrelevant. Officer states that pt is voluntary.

## 2023-07-14 NOTE — ED Notes (Signed)
 Pt got out of bed again moving around and nurse asked pt wants is wrong. Pt stated his feet hurt. Nurse assessed feet and no injury noted but some superficial scratches. Pt states his feet burn and he takes all his medications.
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
## 2023-07-14 NOTE — Consult Note (Signed)
 [facility] Consult Note  Patient Name: Wesley Beck MRN: [id number] DOB: Jun 22, 1970 DATE OF Consult: 07/14/2023  PRIMARY PSYCHIATRIC DIAGNOSES  1.  Schizophrenia, paranoid 2.  Bipolar disorder 3.  Cannabis use  RECOMMENDATIONS  Recommendations:  Medication recommendations: for schizophrenia / paranoia, consider olanzapine  5 mg po bid as aligned with prior chart hx  Non-Medication/therapeutic recommendations: psych admission  Is inpatient psychiatric hospitalization recommended for this patient? Yes (Explain why): paranoia, passive HI, thought disorganization  Follow-Up Telepsychiatry C/L services: We will sign off for now. Please re-consult our service if needed for any concerning changes in the patient's condition, discharge planning, or questions.  Communication: Treatment team members (and family members if applicable) who were involved in treatment/care discussions and planning, and with whom we spoke or engaged with via secure text/chat, include the following: RN  Thank you for involving us  in the care of this patient. If you have any additional questions or concerns, please call [phone] and ask for me or the provider on-call.  TELEPSYCHIATRY ATTESTATION & CONSENT  As the provider for this telehealth consult, I attest that I verified the patient's identity using two separate identifiers, introduced myself to the patient, provided my credentials, disclosed my location, and performed this encounter via a HIPAA-compliant, real-time, face-to-face, two-way, interactive audio and video platform and with the full consent and agreement of the patient (or guardian as applicable.)  Patient physical location: ED at [facility]. Telehealth provider physical location: home office in state of [place] .  Video start time: 115p CST (Central Time) Video end time: 135p CST (Central Time)  IDENTIFYING DATA  Wesley Beck is a 53 y.o. year-old male for whom a psychiatric  consultation has been ordered by the primary provider. The patient was identified using two separate identifiers.  CHIEF COMPLAINT/REASON FOR CONSULT   Paranoia; agitation; anxiety   HISTORY OF PRESENT ILLNESS (HPI)  The patient is a 53 year old man with chart history of bipolar-1 disorder, schizophrenia, depression; he presents to ED with vague complaints, though he is brought by the police.   The patient when seen is oriented to self and - after repeated prompts - current year. The patient demonstrates a tangential thought process with an irritable, labile affect; he reports he feels like taking a walk, and he describes multiple complaints in a disorganized fashion, including frustration with someone apparently asking him to mow the lawn but also making him go "this way and that way" . He will laugh and act oddly when asked re: HI, describing a conditional passive non-specific HI towards persons that are threatening his children, which he cannot describe further though does endorse intermittent paranoia. He reports history of AVH and becomes irritable when asking when last experiencing them, as he denies them right now.   Seemingly unrelated to line of query, he will tangent about lighting himself on fire, without any clear context.I am concerned he is responding to internal stimuli. He endorses insomnia, difficulty concentrating; I advised Psychiatric admission and he is amenable. He states he takes olanzapine  as prescribed but will then describe multiple frequencies without any dose.   PAST PSYCHIATRIC HISTORY  Per chart, does have history of suicide attempt Psychiatric admissions: Yes Outpatient Treatment: Yes, unclear compliance Prior meds: per chart, h/o olanzapine   Otherwise as per HPI above.  PAST MEDICAL HISTORY  Past Medical History:  Diagnosis Date   Arthritis    Asthma    Bipolar 1 disorder (HCC)    Chronic back pain  Depression    Schizophrenia, paranoid type (HCC)     Seasonal allergies      HOME MEDICATIONS  Facility Ordered Medications  Medication   [COMPLETED] LORazepam  (ATIVAN ) tablet 1 mg   [COMPLETED] gabapentin (NEURONTIN) capsule 200 mg   PTA Medications  Medication Sig   OLANZapine  (ZYPREXA ) 5 MG tablet Take 5 mg by mouth 2 (two) times daily.   albuterol  (VENTOLIN  HFA) 108 (90 Base) MCG/ACT inhaler Inhale 1-2 puffs into the lungs every 6 (six) hours as needed for wheezing or shortness of breath.   montelukast  (SINGULAIR ) 10 MG tablet Take 1 tablet (10 mg total) by mouth at bedtime.   albuterol  (PROVENTIL ) (2.5 MG/3ML) 0.083% nebulizer solution Take 3 mLs (2.5 mg total) by nebulization every 6 (six) hours as needed for wheezing or shortness of breath.   carvedilol (COREG) 3.125 MG tablet Take 3.125 mg by mouth 2 (two) times daily with a meal.   TRELEGY ELLIPTA 100-62.5-25 MCG/ACT AEPB Inhale 1 puff into the lungs daily.     ALLERGIES  No Known Allergies  SOCIAL & SUBSTANCE USE HISTORY  Social History   Socioeconomic History   Marital status: Single    Spouse name: Not on file   Number of children: Not on file   Years of education: Not on file   Highest education level: Not on file  Occupational History   Occupation: Unemployed  Tobacco Use   Smoking status: Every Day    Current packs/day: 0.50    Average packs/day: 0.5 packs/day for 25.0 years (12.5 ttl pk-yrs)    Types: Cigarettes   Smokeless tobacco: Never  Vaping Use   Vaping status: Every Day  Substance and Sexual Activity   Alcohol use: No    Comment: social use   Drug use: Yes    Types: Marijuana   Sexual activity: Not Currently  Other Topics Concern   Not on file  Social History Narrative   Pt is homeless; denied psychiatrist.   Social Drivers of Health   Financial Resource Strain: Not on file  Food Insecurity: Food Insecurity Present (10/21/2021)   Hunger Vital Sign    Worried About Running Out of Food in the Last Year: Sometimes true    Ran Out of Food in the  Last Year: Sometimes true  Transportation Needs: No Transportation Needs (10/21/2021)   PRAPARE - Administrator, Civil Service (Medical): No    Lack of Transportation (Non-Medical): No  Physical Activity: Not on file  Stress: Not on file  Social Connections: Not on file   Social History   Tobacco Use  Smoking Status Every Day   Current packs/day: 0.50   Average packs/day: 0.5 packs/day for 25.0 years (12.5 ttl pk-yrs)   Types: Cigarettes  Smokeless Tobacco Never   Social History   Substance and Sexual Activity  Alcohol Use No   Comment: social use   Social History   Substance and Sexual Activity  Drug Use Yes   Types: Marijuana     FAMILY HISTORY  Family History  Problem Relation Age of Onset   COPD Mother    Hypertension Father    Cancer Father    Cancer Other    Family Psychiatric History (if known):  denies  MENTAL STATUS EXAM (MSE)  Mental Status Exam: General Appearance: Disheveled  Orientation:  oriented to self, place, year  Memory:  no apparent deficits  Concentration:  fair  Recall:  fair  Attention  fair  Eye Contact:  evasive  at times  Speech:  loud at times  Language:  fluent English  Volume:  loud at times  Mood: "I feel like taking a walk" - patient  Affect:  labile  Thought Process:  disorganized / tangential  Thought Content:  paranoid ideation; ?delusions  Suicidal Thoughts:  denies  Homicidal Thoughts:  endorses passive conditional HI vs paranoid targets  Judgement:  Impaired  Insight:  impaired  Psychomotor Activity:  no PMA/PMR  Akathisia:  not evident  Fund of Knowledge:  fair     VITALS  Blood pressure (!) 126/96, pulse 74, temperature 98.1 F (36.7 C), temperature source Oral, resp. rate 16, height 5\' 9"  (1.753 m), weight 93.5 kg, SpO2 94%.  LABS  Admission on 07/14/2023  Component Date Value Ref Range Status   Sodium 07/14/2023 138  135 - 145 mmol/L Final   Potassium 07/14/2023 3.5  3.5 - 5.1 mmol/L Final    Chloride 07/14/2023 104  98 - 111 mmol/L Final   CO2 07/14/2023 22  22 - 32 mmol/L Final   Glucose, Bld 07/14/2023 98  70 - 99 mg/dL Final   Glucose reference range applies only to samples taken after fasting for at least 8 hours.   BUN 07/14/2023 11  6 - 20 mg/dL Final   Creatinine, Ser 07/14/2023 0.95  0.61 - 1.24 mg/dL Final   Calcium  07/14/2023 9.4  8.9 - 10.3 mg/dL Final   Total Protein 16/11/9602 7.2  6.5 - 8.1 g/dL Final   Albumin 54/10/8117 4.4  3.5 - 5.0 g/dL Final   AST 14/78/2956 63 (H)  15 - 41 U/L Final   ALT 07/14/2023 49 (H)  0 - 44 U/L Final   Alkaline Phosphatase 07/14/2023 89  38 - 126 U/L Final   Total Bilirubin 07/14/2023 1.1  0.0 - 1.2 mg/dL Final   GFR, Estimated 07/14/2023 >60  >60 mL/min Final   Comment: (NOTE) Calculated using the CKD-EPI Creatinine Equation (2021)    Anion gap 07/14/2023 12  5 - 15 Final   Performed at [facility], [address], [place], [place] [address]   Alcohol, Ethyl (B) 07/14/2023 <15  <15 mg/dL Final   Comment: (NOTE) For medical purposes only. Performed at [facility], [address] [place], [place] [address]    Opiates 07/14/2023 NONE DETECTED  NONE DETECTED Final   Cocaine 07/14/2023 NONE DETECTED  NONE DETECTED Final   Benzodiazepines 07/14/2023 NONE DETECTED  NONE DETECTED Final   Amphetamines 07/14/2023 NONE DETECTED  NONE DETECTED Final   Tetrahydrocannabinol 07/14/2023 POSITIVE (A)  NONE DETECTED Final   Barbiturates 07/14/2023 NONE DETECTED  NONE DETECTED Final   Comment: (NOTE) DRUG SCREEN FOR MEDICAL PURPOSES ONLY.  IF CONFIRMATION IS NEEDED FOR ANY PURPOSE, NOTIFY LAB WITHIN 5 DAYS.  LOWEST DETECTABLE LIMITS FOR URINE DRUG SCREEN Drug Class                     Cutoff (ng/mL) Amphetamine and metabolites    1000 Barbiturate and metabolites    200 Benzodiazepine                 200 Opiates and metabolites        300 Cocaine and metabolites        300 THC                            50 Performed at [facility], [address], [place], [place] [address]  WBC 07/14/2023 6.9  4.0 - 10.5 K/uL Final   RBC 07/14/2023 4.54  4.22 - 5.81 MIL/uL Final   Hemoglobin 07/14/2023 14.8  13.0 - 17.0 g/dL Final   HCT 40/98/1191 42.0  39.0 - 52.0 % Final   MCV 07/14/2023 92.5  80.0 - 100.0 fL Final   MCH 07/14/2023 32.6  26.0 - 34.0 pg Final   MCHC 07/14/2023 35.2  30.0 - 36.0 g/dL Final   RDW 47/82/9562 12.5  11.5 - 15.5 % Final   Platelets 07/14/2023 314  150 - 400 K/uL Final   nRBC 07/14/2023 0.0  0.0 - 0.2 % Final   Neutrophils Relative % 07/14/2023 54  % Final   Neutro Abs 07/14/2023 3.8  1.7 - 7.7 K/uL Final   Lymphocytes Relative 07/14/2023 23  % Final   Lymphs Abs 07/14/2023 1.6  0.7 - 4.0 K/uL Final   Monocytes Relative 07/14/2023 11  % Final   Monocytes Absolute 07/14/2023 0.8  0.1 - 1.0 K/uL Final   Eosinophils Relative 07/14/2023 10  % Final   Eosinophils Absolute 07/14/2023 0.7 (H)  0.0 - 0.5 K/uL Final   Basophils Relative 07/14/2023 2  % Final   Basophils Absolute 07/14/2023 0.1  0.0 - 0.1 K/uL Final   Immature Granulocytes 07/14/2023 0  % Final   Abs Immature Granulocytes 07/14/2023 0.02  0.00 - 0.07 K/uL Final   Performed at [facility], [address], [place], [place] [address]   Salicylate Lvl 07/14/2023 <7.0 (L)  7.0 - 30.0 mg/dL Final   Performed at [facility], [address], [place], [place] [address]   Acetaminophen  (Tylenol ), Serum 07/14/2023 <10 (L)  10 - 30 ug/mL Final   Comment: (NOTE) Therapeutic concentrations vary significantly. A range of 10-30 ug/mL  may be an effective concentration for many patients. However, some  are best treated at concentrations outside of this range. Acetaminophen  concentrations >150 ug/mL at 4 hours after ingestion  and >50 ug/mL at 12 hours after ingestion are often associated with  toxic reactions.  Performed at [facility], [address], [place], [place] [address]     PSYCHIATRIC REVIEW OF SYSTEMS (ROS)  ROS: Notable for the  following relevant positive findings: Paranoia; difficulty concentrating; insomnia; agitation; passive HI  Additional findings:      Musculoskeletal: No abnormal movements observed      Gait & Station: Laying/Sitting      Pain Screening: Denies      Nutrition & Dental Concerns: n/a  RISK FORMULATION/ASSESSMENT  Is the patient experiencing any suicidal or homicidal ideations: Yes       Explain if yes: passive conditional HI related to paranoid ideation Protective factors considered for safety management: help-seeking; prior treatment response  Risk factors/concerns considered for safety management:  Prior attempt Depression Isolation Barriers to accessing treatment Male gender  Is there a safety management plan with the patient and treatment team to minimize risk factors and promote protective factors: Yes           Explain: meds, psych admission Is crisis care placement or psychiatric hospitalization recommended: Yes     Based on my current evaluation and risk assessment, patient is determined at this time to be at:  Moderate Risk  *RISK ASSESSMENT Risk assessment is a dynamic process; it is possible that this patient's condition, and risk level, may change. This should be re-evaluated and managed over time as appropriate. Please re-consult psychiatric consult services if additional assistance is needed in terms of risk assessment  and management. If your team decides to discharge this patient, please advise the patient how to best access emergency psychiatric services, or to call 911, if their condition worsens or they feel unsafe in any way.   Alferd Angers, MD Telepsychiatry Consult Services

## 2023-07-14 NOTE — BH Assessment (Signed)
 [facility] Consult entered 1236 hours this date, Tawny Fate to coordinate

## 2023-07-14 NOTE — ED Provider Notes (Signed)
 Patient is medically clear for TTS evaluation at this time.   Roselynn Connors, PA-C 07/14/23 1143    Guadalupe Lee, MD 07/15/23 0930

## 2023-07-14 NOTE — ED Notes (Signed)
 Pt wanded by security.

## 2023-07-14 NOTE — Progress Notes (Signed)
 Patient has been denied by [facility] due to no appropriate beds available. Patient meets BH inpatient criteria per Rey Catholic, MD. Patient has been faxed out to the following facilities:   [facility] [address] [place] [address] [phone]  [facility] [address] [place] [address] [phone]  [facility] [address] [place], [place] [address] [phone]  [facility] Patient Placement [facility], [place] [phone]  [facility] [address] [place] [address] [phone]  [facility] [address] [place] [address] [phone]  [facility] Josephina Nicks [place] [address] [phone]  [facility] [address] [place] [address] [phone]  [facility] EFAX [address], [place] [phone]  [facility] [address] Johnella Naas [place] [address] [phone]  [facility] [address] [place] [address] [phone]  [facility] [address] Johnella Naas [place] [address] [phone]  [facility] [address] [place], [place] [address] [phone]  [facility] [address] [place] [address] [phone]  [facility] [address] [place] [address] [phone]  [facility] [address] [place] [address] [phone]  [facility] [address], [place] [address] [phone]  [facility] [address] [place] [address] [phone]  [facility] [address] [place] [address] [phone]   Phares Brasher, MSW, LCSW-A  6:29 PM 07/14/2023

## 2023-07-14 NOTE — ED Notes (Signed)
 Nurse attempted to do assessment but pt is really irritated and only answered his name and birthdate. Pt is now looking around like he is confused. Pt is grinding his teeth and holding back tears. Pt is taking his fingers and looking at them as if he is counting with just his first two fingers on each hand. Pt stood up and began and walk around in a circle figety but was able to be redirected back to bed.

## 2023-07-14 NOTE — ED Notes (Signed)
 2315 - Round check is complete at this time. Pt is resting with no s/s of distress.   0000 - Round check is complete at this time. Pt is resting with no s/s of distress.   0100 - Round check is complete at this time. Pt is resting with no s/s of distress.   0201 - Round check is complete at this time. Pt is resting with no s/s of distress.   4098 - Round check is complete at this time. Pt is resting with no s/s of distress.

## 2023-07-14 NOTE — ED Notes (Signed)
Pt changing into scrubs 

## 2023-07-15 NOTE — ED Provider Notes (Signed)
 Emergency Medicine Observation Re-evaluation Note  Wesley Beck is a 53 y.o. male, seen on rounds today.  Pt initially presented to the ED for complaints of ?hx schizoaffective disorder, with odd behavior. No new c/o this AM. BH team has re-started olanzapine  and is pursing inpatient BH placement.   Physical Exam  BP (!) 126/96 (BP Location: Right Arm)   Pulse 74   Temp 98.1 F (36.7 C) (Oral)   Resp 16   Ht 1.753 m (5\' 9" )   Wt 93.5 kg   SpO2 94%   BMI 30.45 kg/m  Physical Exam General: calm, no acute distress.  Cardiac: regular rate.  Lungs: breathing comfortably. Psych: calm, cooperative.   ED Course / MDM    I have reviewed the labs performed to date as well as medications administered while in observation.  Recent changes in the last 24 hours include ED obs, reassessment.   Plan  Current plan is for [facility] placement. Dispo per bh team.     Guadalupe Lee, MD 07/15/23 0800

## 2023-07-15 NOTE — ED Notes (Signed)
 NT to desk stating pt is wanting to leave, EDP aware

## 2023-07-15 NOTE — ED Notes (Signed)
 Pt off unit, refuses to sign AMA

## 2023-07-15 NOTE — ED Notes (Signed)
 Pt requesting to leave, denies SI, HI, discussed POC with pt who continues to request to leave, pt belongings returned, EDP aware

## 2023-07-15 NOTE — Progress Notes (Signed)
 Patient has been denied by [facility] due to no appropriate beds available. Patient meets BH inpatient criteria per Rey Catholic, MD. Patient has been faxed out to the following facilities:   [facility] [address], [place] [address] [phone]  [facility] [address], [place] [address] [phone]  [facility] [address] [place], [place] [address] [phone]  [facility] Patient Placement [facility], [place] [phone]  [facility] [address] [place] [address] [phone]  [facility] [address] [place] [address] [phone]  [facility] Josephina Nicks [place] [address] [phone]  [facility] [address] [place] [address] [phone]  [facility] EFAX [address] [place] [phone]  [facility] [address] Johnella Naas [place] [address] [phone]  [facility] [address], [place] [address] [phone]  [facility] [address] Johnella Naas [place] [address] [phone]  [facility] [address] [place], [place] [address] [phone]  [facility] [address] [place] [address] [phone]  [facility] [address] [place] [address] [phone]  [facility] [address] [place] [address] [phone]  [facility] [address], [place] [address] [phone]  [facility] [address] [place] [address] [phone]  [facility] [address] [place] [address] [phone]   Phares Brasher, MSW, LCSW-A  11:43 AM 07/15/2023

## 2023-07-15 NOTE — ED Notes (Signed)
 Pt given lunch tray.

## 2023-08-01 DIAGNOSIS — J449 Chronic obstructive pulmonary disease, unspecified: Secondary | ICD-10-CM | POA: Diagnosis not present

## 2023-08-23 ENCOUNTER — Emergency Department (HOSPITAL_COMMUNITY)
Admission: EM | Admit: 2023-08-23 | Discharge: 2023-08-23 | Discharge status: Against Medical Advice | Attending: Emergency Medicine | Admitting: Emergency Medicine

## 2023-08-23 ENCOUNTER — Encounter (HOSPITAL_COMMUNITY): Payer: Self-pay | Admitting: Emergency Medicine

## 2023-08-23 ENCOUNTER — Other Ambulatory Visit: Payer: Self-pay

## 2023-08-23 ENCOUNTER — Inpatient Hospital Stay (HOSPITAL_COMMUNITY): Admit: 2023-08-23 | Source: Intra-hospital

## 2023-08-23 DIAGNOSIS — F1210 Cannabis abuse, uncomplicated: Secondary | ICD-10-CM | POA: Diagnosis not present

## 2023-08-23 DIAGNOSIS — F339 Major depressive disorder, recurrent, unspecified: Secondary | ICD-10-CM | POA: Diagnosis present

## 2023-08-23 DIAGNOSIS — R45851 Suicidal ideations: Secondary | ICD-10-CM

## 2023-08-23 DIAGNOSIS — Z79899 Other long term (current) drug therapy: Secondary | ICD-10-CM | POA: Insufficient documentation

## 2023-08-23 DIAGNOSIS — F3010 Manic episode without psychotic symptoms, unspecified: Secondary | ICD-10-CM

## 2023-08-23 DIAGNOSIS — Z5329 Procedure and treatment not carried out because of patient's decision for other reasons: Secondary | ICD-10-CM | POA: Diagnosis not present

## 2023-08-23 LAB — COMPREHENSIVE METABOLIC PANEL WITH GFR
ALT: 33 U/L (ref 0–44)
AST: 34 U/L (ref 15–41)
Albumin: 4 g/dL (ref 3.5–5.0)
Alkaline Phosphatase: 89 U/L (ref 38–126)
Anion gap: 11 (ref 5–15)
BUN: 14 mg/dL (ref 6–20)
CO2: 28 mmol/L (ref 22–32)
Calcium: 9.3 mg/dL (ref 8.9–10.3)
Chloride: 100 mmol/L (ref 98–111)
Creatinine, Ser: 1.01 mg/dL (ref 0.61–1.24)
GFR, Estimated: >60 mL/min (ref >60)
Glucose, Bld: 89 mg/dL (ref 70–99)
Potassium: 3.8 mmol/L (ref 3.5–5.1)
Sodium: 139 mmol/L (ref 135–145)
Total Bilirubin: 0.7 mg/dL (ref 0.0–1.2)
Total Protein: 6.8 g/dL (ref 6.5–8.1)

## 2023-08-23 LAB — CBC
HCT: 47.5 % (ref 39.0–52.0)
Hemoglobin: 16.3 g/dL (ref 13.0–17.0)
MCH: 33.3 pg (ref 26.0–34.0)
MCHC: 34.3 g/dL (ref 30.0–36.0)
MCV: 97.1 fL (ref 80.0–100.0)
Platelets: 302 K/uL (ref 150–400)
RBC: 4.89 MIL/uL (ref 4.22–5.81)
RDW: 12.8 % (ref 11.5–15.5)
WBC: 8.4 K/uL (ref 4.0–10.5)
nRBC: 0 % (ref 0.0–0.2)

## 2023-08-23 LAB — RAPID URINE DRUG SCREEN, HOSP PERFORMED
Amphetamines: NOT DETECTED
Barbiturates: NOT DETECTED
Benzodiazepines: NOT DETECTED
Cocaine: NOT DETECTED
Opiates: NOT DETECTED
Tetrahydrocannabinol: POSITIVE — AB

## 2023-08-23 LAB — ETHANOL: Alcohol, Ethyl (B): <15 mg/dL (ref <15)

## 2023-08-23 NOTE — ED Notes (Signed)
 Pt ambulated to the bathroom with sitter

## 2023-08-23 NOTE — ED Triage Notes (Signed)
 Pt states he needs to committed. States he was diagnosed with schizophrenia years ago. Pt states family believes he isn't taking his medications. Pt denies this. Pt with loud speech stating he is burden to his family and wants to be done with it. States he wants out of this state and out of this county states he gets no help from family or anything other than eternal damnation. Pt states we can harvest his organs and have them because he doesn't want them anymore. Pt endorses SI. When asked if he had a plan on how he would kill himself, pt states he would like to do it virtually.

## 2023-08-23 NOTE — ED Notes (Signed)
 ED Provider at bedside.

## 2023-08-23 NOTE — ED Notes (Signed)
 I see the econsent. you can prepare to call report. I will add in dayshift staff. Attending MD will be Dr. Oliva Salmon. RN TO RN report to [phone]

## 2023-08-23 NOTE — ED Notes (Signed)
Pt TTS 

## 2023-08-23 NOTE — ED Notes (Signed)
 Attempted to call report at [facility] and was told to call back in 20 minutes.

## 2023-08-23 NOTE — ED Notes (Signed)
 Belongings consisting of shirt, pants, belt, metal box of cigarettes, wallet, and keys were placed in locker #12.

## 2023-08-23 NOTE — ED Provider Notes (Signed)
 [place] EMERGENCY DEPARTMENT AT [facility] Provider Note   CSN: [id number] Arrival date & time: 08/23/23  0045     Patient presents with: Medical Clearance   Wesley Beck is a 53 y.o. male.   Patient is a 53 year old male with past medical history of bipolar disorder, schizoaffective disorder, suicidal behavior, and substance abuse.  Patient presenting today for evaluation of suicidal ideation and dissatisfaction with his current station in life.  Patient apparently living in a shed behind his father's house and is dissatisfied with how things are going for him.  He feels suicidal.       Prior to Admission medications   Medication Sig Start Date End Date Taking? Authorizing Provider  atorvastatin (LIPITOR) 20 MG tablet Take 20 mg by mouth at bedtime. 05/08/23   [provider]  carvedilol (COREG) 3.125 MG tablet Take 3.125 mg by mouth 2 (two) times daily with a meal.    [provider]  hydrOXYzine  (ATARAX ) 25 MG tablet Take 25 mg by mouth 3 (three) times daily as needed. 05/09/23   [provider]  loratadine  (CLARITIN ) 10 MG tablet Take 10 mg by mouth at bedtime. 05/09/23   [provider]  meloxicam (MOBIC) 7.5 MG tablet Take 7.5 mg by mouth 2 (two) times daily as needed. 07/03/23   [provider]  OLANZapine  (ZYPREXA ) 5 MG tablet Take 5 mg by mouth 2 (two) times daily. 10/04/21   [provider]  rosuvastatin (CRESTOR) 10 MG tablet Take 10 mg by mouth daily. 06/01/23   [provider]  thiamine (VITAMIN B1) 100 MG tablet Take 100 mg by mouth daily.    [provider]  TRELEGY ELLIPTA 100-62.5-25 MCG/ACT AEPB Inhale 1 puff into the lungs daily. 10/09/21   [provider]    Allergies: Patient has no known allergies.    Review of Systems  All other systems reviewed and are negative.   Updated Vital Signs BP (!) 147/98 (BP Location: Right Arm)   Pulse 60   Temp 98.4 F (36.9 C) (Oral)    Resp 18   Ht 5' 9 (1.753 m)   Wt 97.5 kg   SpO2 100%   BMI 31.75 kg/m   Physical Exam Vitals and nursing note reviewed.  Constitutional:      General: He is not in acute distress.    Appearance: He is well-developed. He is not diaphoretic.  HENT:     Head: Normocephalic and atraumatic.  Cardiovascular:     Rate and Rhythm: Normal rate and regular rhythm.     Heart sounds: No murmur heard.    No friction rub.  Pulmonary:     Effort: Pulmonary effort is normal. No respiratory distress.     Breath sounds: Normal breath sounds. No wheezing or rales.  Abdominal:     General: Bowel sounds are normal. There is no distension.     Palpations: Abdomen is soft.     Tenderness: There is no abdominal tenderness.  Musculoskeletal:        General: Normal range of motion.     Cervical back: Normal range of motion and neck supple.  Skin:    General: Skin is warm and dry.  Neurological:     Mental Status: He is alert and oriented to person, place, and time.     Coordination: Coordination normal.  Psychiatric:        Attention and Perception: Attention normal. He does not perceive auditory or visual hallucinations.  Mood and Affect: Affect is angry.        Speech: Speech is tangential.        Behavior: Behavior is agitated and aggressive.        Thought Content: Thought content includes suicidal ideation. Thought content does not include homicidal ideation. Thought content does not include homicidal or suicidal plan.        Judgment: Judgment is not impulsive or inappropriate.    (all labs ordered are listed, but only abnormal results are displayed) Labs Reviewed  COMPREHENSIVE METABOLIC PANEL WITH GFR  ETHANOL  CBC  RAPID URINE DRUG SCREEN, HOSP PERFORMED    EKG: None  Radiology: No results found.   Procedures   Medications Ordered in the ED - No data to display                                  Medical Decision Making Amount and/or Complexity of Data  Reviewed Labs: ordered.   Patient presenting with suicidal ideation as described in the HPI.  He has history of bipolar disorder, schizoaffective disorder and substance abuse.  Patient's medical screening is unremarkable including CBC, CMP, serum ethanol.  His drug screen is positive for marijuana.  He has been evaluated by TTS who feels as though he meets inpatient criteria.  Patient admitted to be admitted to behavioral health.     Final diagnoses:  None    ED Discharge Orders     None          Geroldine Berg, MD 08/23/23 [phone]

## 2023-08-23 NOTE — ED Notes (Signed)
 Safe transport called and stated pt refused to be taken to site and got his belongings and left walking.

## 2023-08-23 NOTE — ED Notes (Signed)
Pt wanded by security in triage. 

## 2023-08-23 NOTE — ED Notes (Signed)
 Lab at bedside

## 2023-08-23 NOTE — ED Notes (Signed)
 Patients belongings were returned to patient and patient was walking to be discharged to safe transport and patient left AMA.

## 2023-08-23 NOTE — ED Notes (Signed)
 Pt changed into scrubs, wanded again by security, and belongings placed into belonging bags and placed in locker.
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
## 2023-09-02 ENCOUNTER — Emergency Department (HOSPITAL_COMMUNITY)
Admission: EM | Admit: 2023-09-02 | Discharge: 2023-09-02 | Disposition: A | Discharge status: Other Acute Inpatient Hospital | Attending: Emergency Medicine | Admitting: Emergency Medicine

## 2023-09-02 ENCOUNTER — Encounter (HOSPITAL_COMMUNITY): Payer: Self-pay

## 2023-09-02 ENCOUNTER — Other Ambulatory Visit (HOSPITAL_COMMUNITY)
Admission: EM | Admit: 2023-09-02 | Discharge: 2023-09-06 | Disposition: A | Discharge status: Home / Self Care | Source: Intra-hospital

## 2023-09-02 ENCOUNTER — Other Ambulatory Visit: Payer: Self-pay

## 2023-09-02 DIAGNOSIS — F22 Delusional disorders: Secondary | ICD-10-CM | POA: Diagnosis not present

## 2023-09-02 DIAGNOSIS — R45851 Suicidal ideations: Secondary | ICD-10-CM | POA: Diagnosis not present

## 2023-09-02 DIAGNOSIS — Z79899 Other long term (current) drug therapy: Secondary | ICD-10-CM | POA: Diagnosis not present

## 2023-09-02 DIAGNOSIS — F29 Unspecified psychosis not due to a substance or known physiological condition: Secondary | ICD-10-CM

## 2023-09-02 DIAGNOSIS — R451 Restlessness and agitation: Secondary | ICD-10-CM | POA: Diagnosis present

## 2023-09-02 DIAGNOSIS — F1220 Cannabis dependence, uncomplicated: Secondary | ICD-10-CM | POA: Diagnosis not present

## 2023-09-02 DIAGNOSIS — F209 Schizophrenia, unspecified: Secondary | ICD-10-CM | POA: Diagnosis present

## 2023-09-02 DIAGNOSIS — F1210 Cannabis abuse, uncomplicated: Secondary | ICD-10-CM | POA: Diagnosis not present

## 2023-09-02 DIAGNOSIS — F39 Unspecified mood [affective] disorder: Secondary | ICD-10-CM | POA: Insufficient documentation

## 2023-09-02 DIAGNOSIS — F600 Paranoid personality disorder: Secondary | ICD-10-CM | POA: Diagnosis not present

## 2023-09-02 DIAGNOSIS — F259 Schizoaffective disorder, unspecified: Secondary | ICD-10-CM | POA: Insufficient documentation

## 2023-09-02 DIAGNOSIS — Z5901 Sheltered homelessness: Secondary | ICD-10-CM | POA: Insufficient documentation

## 2023-09-02 DIAGNOSIS — F602 Antisocial personality disorder: Secondary | ICD-10-CM | POA: Diagnosis not present

## 2023-09-02 DIAGNOSIS — F331 Major depressive disorder, recurrent, moderate: Secondary | ICD-10-CM | POA: Diagnosis present

## 2023-09-02 LAB — COMPREHENSIVE METABOLIC PANEL WITH GFR
ALT: 26 U/L (ref 0–44)
AST: 33 U/L (ref 15–41)
Albumin: 4.1 g/dL (ref 3.5–5.0)
Alkaline Phosphatase: 82 U/L (ref 38–126)
Anion gap: 7 (ref 5–15)
BUN: 11 mg/dL (ref 6–20)
CO2: 25 mmol/L (ref 22–32)
Calcium: 9.2 mg/dL (ref 8.9–10.3)
Chloride: 106 mmol/L (ref 98–111)
Creatinine, Ser: 1.01 mg/dL (ref 0.61–1.24)
GFR, Estimated: >60 mL/min (ref >60)
Glucose, Bld: 97 mg/dL (ref 70–99)
Potassium: 3.9 mmol/L (ref 3.5–5.1)
Sodium: 138 mmol/L (ref 135–145)
Total Bilirubin: 1 mg/dL (ref 0.0–1.2)
Total Protein: 6.9 g/dL (ref 6.5–8.1)

## 2023-09-02 LAB — CBC
HCT: 42 % (ref 39.0–52.0)
Hemoglobin: 15.1 g/dL (ref 13.0–17.0)
MCH: 33.6 pg (ref 26.0–34.0)
MCHC: 36 g/dL (ref 30.0–36.0)
MCV: 93.3 fL (ref 80.0–100.0)
Platelets: 278 K/uL (ref 150–400)
RBC: 4.5 MIL/uL (ref 4.22–5.81)
RDW: 12.5 % (ref 11.5–15.5)
WBC: 6 K/uL (ref 4.0–10.5)
nRBC: 0 % (ref 0.0–0.2)

## 2023-09-02 LAB — RAPID URINE DRUG SCREEN, HOSP PERFORMED
Amphetamines: NOT DETECTED
Barbiturates: NOT DETECTED
Benzodiazepines: NOT DETECTED
Cocaine: NOT DETECTED
Opiates: NOT DETECTED
Tetrahydrocannabinol: POSITIVE — AB

## 2023-09-02 LAB — ETHANOL: Alcohol, Ethyl (B): <15 mg/dL (ref <15)

## 2023-09-02 MED ORDER — ZOLPIDEM TARTRATE 5 MG PO TABS: 5.0000 mg | ORAL_TABLET | Freq: Every evening | ORAL | Status: DC | PRN

## 2023-09-02 MED ORDER — LORAZEPAM 2 MG/ML IJ SOLN: 2.0000 mg | Freq: Three times a day (TID) | INTRAMUSCULAR | Status: DC | PRN

## 2023-09-02 MED ORDER — ACETAMINOPHEN 325 MG PO TABS: 650.0000 mg | ORAL_TABLET | ORAL | Status: DC | PRN

## 2023-09-02 MED ORDER — HALOPERIDOL LACTATE 5 MG/ML IJ SOLN: 10.0000 mg | Freq: Three times a day (TID) | INTRAMUSCULAR | Status: DC | PRN

## 2023-09-02 MED ORDER — LORAZEPAM 1 MG PO TABS: 1.0000 mg | ORAL_TABLET | ORAL | Status: DC | PRN

## 2023-09-02 MED ORDER — RISPERIDONE 1 MG PO TBDP: 2.0000 mg | ORAL_TABLET | Freq: Three times a day (TID) | ORAL | Status: DC | PRN

## 2023-09-02 MED ORDER — DIPHENHYDRAMINE HCL 50 MG/ML IJ SOLN: 50.0000 mg | Freq: Three times a day (TID) | INTRAMUSCULAR | Status: DC | PRN

## 2023-09-02 MED ORDER — MAGNESIUM HYDROXIDE 400 MG/5ML PO SUSP: 30.0000 mL | Freq: Every day | ORAL | Status: DC | PRN

## 2023-09-02 MED ORDER — HYDROXYZINE HCL 25 MG PO TABS: 25.0000 mg | ORAL_TABLET | Freq: Three times a day (TID) | ORAL | Status: DC | PRN

## 2023-09-02 MED ORDER — OLANZAPINE 5 MG PO TABS
5.0000 mg | ORAL_TABLET | Freq: Every day | ORAL | Status: DC
Start: 2023-09-03 — End: 2023-09-03
  Administered 2023-09-02: 5 mg via ORAL
  Filled 2023-09-02: qty 1

## 2023-09-02 MED ORDER — ZIPRASIDONE MESYLATE 20 MG IM SOLR: 20.0000 mg | INTRAMUSCULAR | Status: DC | PRN

## 2023-09-02 MED ORDER — ALUM & MAG HYDROXIDE-SIMETH 200-200-20 MG/5ML PO SUSP: 30.0000 mL | ORAL | Status: DC | PRN

## 2023-09-02 MED ORDER — ACETAMINOPHEN 325 MG PO TABS: 650.0000 mg | ORAL_TABLET | Freq: Four times a day (QID) | ORAL | Status: DC | PRN

## 2023-09-02 MED ORDER — ONDANSETRON HCL 4 MG PO TABS
4.0000 mg | ORAL_TABLET | Freq: Three times a day (TID) | ORAL | Status: DC | PRN
Start: 2023-09-02 — End: 2023-09-02

## 2023-09-02 MED ORDER — HALOPERIDOL 5 MG PO TABS: 5.0000 mg | ORAL_TABLET | Freq: Three times a day (TID) | ORAL | Status: DC | PRN

## 2023-09-02 MED ORDER — TRAZODONE HCL 50 MG PO TABS
50.0000 mg | ORAL_TABLET | Freq: Every evening | ORAL | Status: DC | PRN
  Administered 2023-09-02 – 2023-09-05 (×3): 50 mg via ORAL
  Filled 2023-09-02 (×3): qty 1

## 2023-09-02 MED ORDER — DIPHENHYDRAMINE HCL 50 MG PO CAPS: 50.0000 mg | ORAL_CAPSULE | Freq: Three times a day (TID) | ORAL | Status: DC | PRN

## 2023-09-02 MED ORDER — HALOPERIDOL LACTATE 5 MG/ML IJ SOLN: 5.0000 mg | Freq: Three times a day (TID) | INTRAMUSCULAR | Status: DC | PRN

## 2023-09-02 NOTE — ED Notes (Signed)
 Patients belongings locked up in locker 1.

## 2023-09-02 NOTE — ED Triage Notes (Signed)
 When asked what brought pt in today, he stated that he is trying to figure out a way not to be homeless and be of use to people around him. Pt gave no other information after this statement and when asked if he was feeling suicidal, he stated :Well I would like to cut my own throat from time to time.

## 2023-09-02 NOTE — ED Notes (Signed)
 Patient admitted into [facility] with SI but  Denies SI/HI/AVH at the moment. Cooperated well during the admission process. Oriented to room and unit. Skin assessments WNL. MHT offered patient a meal and something to drink . NAD, environment secured per policy. Will monitor for safety.

## 2023-09-02 NOTE — ED Provider Notes (Signed)
 [place] EMERGENCY DEPARTMENT AT [facility] Provider Note   CSN: [id number] Arrival date & time: 09/02/23  1145     Patient presents with: Medical Clearance   Wesley Beck is a 53 y.o. male.   HPI     53 year old patient with a chief complaint of SI.  Patient has history of bipolar disorder, schizoaffective disorder. Patient states that he is unhappy about his family not treating him well.  He then starts talking about how his son's family, his dad has family, and that they are trying to screw him.  Patient is not being treated well by them.  He states that he is taking his medications.  He was seen in the ER few days back, they recommended that patient be admitted to [facility], but patient eloped.  He states that he left because the shuttle was not in make a pit stop for patient to grab his meds.  When asked if patient had any SI currently, he states that he has had thoughts about cutting his throat.  He also states that he is an organ donor and has good liver.   Prior to Admission medications   Medication Sig Start Date End Date Taking? Authorizing Provider  albuterol  (VENTOLIN  HFA) 108 (90 Base) MCG/ACT inhaler Inhale 1-2 puffs into the lungs every 4 (four) hours as needed for wheezing or shortness of breath. 08/22/23  Yes [provider]  carvedilol  (COREG ) 3.125 MG tablet Take 3.125 mg by mouth 2 (two) times daily with a meal.   Yes [provider]  hydrOXYzine  (ATARAX ) 25 MG tablet Take 25 mg by mouth 3 (three) times daily as needed for anxiety. 05/09/23  Yes [provider]  loratadine  (CLARITIN ) 10 MG tablet Take 10 mg by mouth at bedtime. 05/09/23  Yes [provider]  meloxicam (MOBIC) 7.5 MG tablet Take 7.5 mg by mouth 2 (two) times daily as needed. 07/03/23  Yes [provider]  OLANZapine  (ZYPREXA ) 5 MG tablet Take 5 mg by mouth 2 (two) times daily. 10/04/21  Yes [provider]  rosuvastatin  (CRESTOR ) 10 MG tablet  Take 10 mg by mouth once a week. Monday and Friday 06/01/23  Yes [provider]  TRELEGY ELLIPTA 100-62.5-25 MCG/ACT AEPB Inhale 1 puff into the lungs daily. 10/09/21  Yes [provider]  atorvastatin  (LIPITOR) 20 MG tablet Take 20 mg by mouth at bedtime. 05/08/23   [provider]  ibuprofen  (ADVIL ) 400 MG tablet Take 400 mg by mouth every 6 (six) hours as needed. 04/30/23   [provider]  thiamine (VITAMIN B1) 100 MG tablet Take 100 mg by mouth daily.    [provider]    Allergies: Cat dander, Dog epithelium (canis lupus familiaris), and Other    Review of Systems  All other systems reviewed and are negative.   Updated Vital Signs BP (!) 145/99   Pulse 78   Temp 98 F (36.7 C) (Oral)   Resp 18   Ht 5' 9 (1.753 m)   Wt 98 kg   SpO2 96%   BMI 31.91 kg/m   Physical Exam Vitals reviewed.  Psychiatric:        Attention and Perception: Attention normal.        Mood and Affect: Affect is angry.        Speech: Speech normal.        Behavior: Behavior is agitated.        Thought Content: Thought content is paranoid. Thought  content includes suicidal ideation.        Judgment: Judgment is impulsive.     (all labs ordered are listed, but only abnormal results are displayed) Labs Reviewed  RAPID URINE DRUG SCREEN, HOSP PERFORMED - Abnormal; Notable for the following components:      Result Value   Tetrahydrocannabinol POSITIVE (*)    All other components within normal limits  COMPREHENSIVE METABOLIC PANEL WITH GFR  ETHANOL  CBC    EKG: None  Radiology: No results found.   Procedures   Medications Ordered in the ED  risperiDONE  (RISPERDAL  M-TABS) disintegrating tablet 2 mg (has no administration in time range)    And  LORazepam  (ATIVAN ) tablet 1 mg (has no administration in time range)    And  ziprasidone  (GEODON ) injection 20 mg (has no administration in time range)  acetaminophen  (TYLENOL ) tablet 650 mg (has no  administration in time range)  zolpidem  (AMBIEN ) tablet 5 mg (has no administration in time range)  ondansetron  (ZOFRAN ) tablet 4 mg (has no administration in time range)                                    Medical Decision Making Amount and/or Complexity of Data Reviewed Labs: ordered.  Risk OTC drugs. Prescription drug management.   Patient comes to the ER with cc of suicidal ideation, agitation about his family situation. He has some flight of ideas. Patient has pertinent past medical history of bipolar disorder, schizoaffective disorder. Currently patient is agitated.  I reviewed patient's records including recent ER visit.  At that time, he was supposed to be admitted to [facility], but signed out AMA.  Pt denies nausea, emesis, fevers, chills, chest pains, shortness of breath, headaches, abdominal pain, uti like symptoms and patient has no active medical complaints. I have reviewed previous encounters for this patient and reviewed their primary medications.  Differential diagnosis considered for this patient includes: Depression Bipolar disorder Schizophrenia Substance abuse Suicidal ideation Acute withdrawal  Appropriate labs have been ordered. Patient is medically cleared for psychiatric evaluation.    Final diagnoses:  Suicidal ideation    ED Discharge Orders     None          Charlyn Sora, MD 09/02/23 1334

## 2023-09-02 NOTE — BH Assessment (Addendum)
 TTS consult will be completed by [facility].  [facility] Coordinator Anner), [phone], will communicate in secure chat assessment time and provider name. [facility] was called in 12:30p.

## 2023-09-02 NOTE — Consult Note (Signed)
 [facility] Consult Note  Patient Name: Wesley Beck MRN: [id number] DOB: 08/03/1970 DATE OF Consult: 09/02/2023  PRIMARY PSYCHIATRIC DIAGNOSES  1.  Mood disorder-NOS, r/o bipolar disorders and psychotic disorders including schizophrenia-paranoid type 2.  Cannabis abuse r/o dependence 3.    RECOMMENDATIONS  Recommendations: Medication recommendations: zyprexa  5mg  po BID, haldol /ativan  5mg /2mg  po/IM if refused q6hrs prn agitation. Non-Medication/therapeutic recommendations: check EKG and hold antipsychotics if QTC >500.  Continue current level of observation, increase to 1:1 if agitated and/or he reports SI.   Is inpatient psychiatric hospitalization recommended for this patient? Yes (Explain why): current paranoia and presentation to ER for similar concerns twice in the same month indicate need for inpatient treatment.  He desires inpatient treatment as well but involuntary commitment is appropriate if voluntary inpatient placement is refused. Is another care setting recommended for this patient? (examples may include Crisis Stabilization Unit, Residential/Recovery Treatment, ALF/SNF, Memory Care Unit)  No (Explain why):   From a psychiatric perspective, is this patient appropriate for discharge to an outpatient setting/resource or other less restrictive environment for continued care?  No (Explain why):   Follow-Up Telepsychiatry C/L services: We will continue to follow this patient with you until stabilized or discharged.  If you have any questions or concerns, please call our TeleCare Coordination service at  [phone] and ask for myself or the provider on-call. Communication: Treatment team members (and family members if applicable) who were involved in treatment/care discussions and planning, and with whom we spoke or engaged with via secure text/chat, include the following: nursing staff via video chat  Thank you for involving us  in the care of this patient. If you have any  additional questions or concerns, please call [phone] and ask for me or the provider on-call.  TELEPSYCHIATRY ATTESTATION & CONSENT  As the provider for this telehealth consult, I attest that I verified the patient's identity using two separate identifiers, introduced myself to the patient, provided my credentials, disclosed my location, and performed this encounter via a HIPAA-compliant, real-time, face-to-face, two-way, interactive audio and video platform and with the full consent and agreement of the patient (or guardian as applicable.)  Patient physical location: [facility] Telehealth provider physical location: home office in state of [place]  Video start time: 1415 (Central Time) Video end time: 1451 (Central Time)  IDENTIFYING DATA  Wesley Beck is a 53 y.o. year-old male for whom a psychiatric consultation has been ordered by the primary provider. The patient was identified using two separate identifiers.  CHIEF COMPLAINT/REASON FOR CONSULT  Family stressors  HISTORY OF PRESENT ILLNESS (HPI)  The patient is a 53 y/o WM who reports he brought himself to hospital for help although he is somewhat vague about this concept.  He reports he desires inpatient psychiatric treatment.  He is angry (particularly toward his family) and seems to be suffering from persecutory ideation but he is cooperative, fully alert and oriented though his thought process is tangential.  Recent medication compliance is not entirely clear and he reports he's been sleeping poorly.  He denies current S/H ideation or A/V hallucinations to me but it should be noted that he reported suicidal thoughts to ER provider.  He is able to contract verbally for safety, however.  Similar presentation earlier in the month in which inpatient treatment was recommended but pt. eloped from ER.  He has no other concerns at this time.  PAST PSYCHIATRIC HISTORY  Reports intermittent outpatient treatment.  Reports multiple past  psychiatric admissions,  notes hx of involuntary commitment, reports he's found it helpful.  Reports he takes zyprexa  5mg  po at bedtime and then when I feel like I need it qAM.  He reports hx of abilify use for approximately 15 years but is unsure of efficacy.  Last attempted suicide approximately 25 years ago by overdose of pills.   Otherwise as per HPI above.  PAST MEDICAL HISTORY  Past Medical History:  Diagnosis Date   Arthritis    Asthma    Bipolar 1 disorder (HCC)    Chronic back pain    Depression    Schizophrenia, paranoid type (HCC)    Seasonal allergies      HOME MEDICATIONS  Facility Ordered Medications  Medication   risperiDONE  (RISPERDAL  M-TABS) disintegrating tablet 2 mg   And   LORazepam  (ATIVAN ) tablet 1 mg   And   ziprasidone  (GEODON ) injection 20 mg   acetaminophen  (TYLENOL ) tablet 650 mg   zolpidem  (AMBIEN ) tablet 5 mg   ondansetron  (ZOFRAN ) tablet 4 mg   PTA Medications  Medication Sig   OLANZapine  (ZYPREXA ) 5 MG tablet Take 5 mg by mouth in the morning and at bedtime.   carvedilol  (COREG ) 3.125 MG tablet Take 3.125 mg by mouth 2 (two) times daily with a meal.   TRELEGY ELLIPTA 100-62.5-25 MCG/ACT AEPB Inhale 1 puff into the lungs daily.   meloxicam (MOBIC) 7.5 MG tablet Take 7.5 mg by mouth 2 (two) times daily as needed for pain.   hydrOXYzine  (ATARAX ) 25 MG tablet Take 25 mg by mouth 3 (three) times daily as needed for anxiety.   loratadine  (CLARITIN ) 10 MG tablet Take 10 mg by mouth daily as needed for allergies.   atorvastatin  (LIPITOR) 20 MG tablet Take 20 mg by mouth See admin instructions. Take it on Sunday, Tuesday, Wednesday, Thursday, and Saturday as needed for high cholesterol.   rosuvastatin  (CRESTOR ) 10 MG tablet Take 10 mg by mouth 2 (two) times a week. Monday and Friday   thiamine (VITAMIN B1) 100 MG tablet Take 100 mg by mouth daily.   albuterol  (VENTOLIN  HFA) 108 (90 Base) MCG/ACT inhaler Inhale 1-2 puffs into the lungs every 4 (four) hours as  needed for wheezing or shortness of breath.   ibuprofen  (ADVIL ) 400 MG tablet Take 400 mg by mouth every 6 (six) hours as needed for mild pain (pain score 1-3).    ALLERGIES  Allergies  Allergen Reactions   Cat Dander Itching and Other (See Comments)    Sneezing     Dog Epithelium (Canis Lupus Familiaris) Itching and Other (See Comments)    Sneezing     SOCIAL & SUBSTANCE USE HISTORY  Social History   Socioeconomic History   Marital status: Single    Spouse name: Not on file   Number of children: Not on file   Years of education: Not on file   Highest education level: Not on file  Occupational History   Occupation: Unemployed  Tobacco Use   Smoking status: Every Day    Current packs/day: 0.50    Average packs/day: 0.5 packs/day for 25.0 years (12.5 ttl pk-yrs)    Types: Cigarettes   Smokeless tobacco: Never  Vaping Use   Vaping status: Every Day  Substance and Sexual Activity   Alcohol use: No    Comment: social use   Drug use: Yes    Types: Marijuana   Sexual activity: Not Currently  Other Topics Concern   Not on file  Social History Narrative   Pt  is homeless; denied psychiatrist.   Social Drivers of Health   Financial Resource Strain: Not on file  Food Insecurity: Food Insecurity Present (10/21/2021)   Hunger Vital Sign    Worried About Running Out of Food in the Last Year: Sometimes true    Ran Out of Food in the Last Year: Sometimes true  Transportation Needs: No Transportation Needs (10/21/2021)   PRAPARE - Administrator, Civil Service (Medical): No    Lack of Transportation (Non-Medical): No  Physical Activity: Not on file  Stress: Not on file  Social Connections: Not on file   Social History   Tobacco Use  Smoking Status Every Day   Current packs/day: 0.50   Average packs/day: 0.5 packs/day for 25.0 years (12.5 ttl pk-yrs)   Types: Cigarettes  Smokeless Tobacco Never   Social History   Substance and Sexual Activity  Alcohol Use No    Comment: social use   Social History   Substance and Sexual Activity  Drug Use Yes   Types: Marijuana    Additional pertinent information: I live in a toolshed.  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  Reports cannabis use at least 1-2 times per week.  He denies other substance use.  FAMILY HISTORY  Family History  Problem Relation Age of Onset   COPD Mother    Hypertension Father    Cancer Father    Cancer Other    Family Psychiatric History (if known):  pt. denies mental health issues or suicides in family  MENTAL STATUS EXAM (MSE)  Mental Status Exam: General Appearance: Well Groomed  Orientation:  Full (Time, Place, and Person)  Memory:  Immediate;   Good Recent;   Fair Remote;   Fair  Concentration:  Concentration: Fair and Attention Span: Fair  Recall:  Fair  Attention  Fair  Eye Contact:  Good  Speech:  Clear and Coherent  Language:  Good  Volume:  Normal  Mood: mildly angry  Affect:  Constricted  Thought Process:  Disorganized  Thought Content:  Rumination  Suicidal Thoughts:  No  Homicidal Thoughts:  No  Judgement:  Impaired  Insight:  Shallow  Psychomotor Activity:  Normal  Akathisia:  No  Fund of Knowledge:  Good    Assets:  Desire for Improvement Physical Health Social Support  Cognition:  WNL  ADL's:  Intact  AIMS (if indicated):       VITALS  Blood pressure (!) 145/99, pulse 78, temperature 98 F (36.7 C), temperature source Oral, resp. rate 18, height 5' 9 (1.753 m), weight 98 kg, SpO2 96%.  LABS  Admission on 09/02/2023  Component Date Value Ref Range Status   Sodium 09/02/2023 138  135 - 145 mmol/L Final   Potassium 09/02/2023 3.9  3.5 - 5.1 mmol/L Final   Chloride 09/02/2023 106  98 - 111 mmol/L Final   CO2 09/02/2023 25  22 - 32 mmol/L Final   Glucose, Bld 09/02/2023 97  70 - 99 mg/dL Final   Glucose reference range applies only to samples taken after fasting for at least 8 hours.   BUN 09/02/2023  11  6 - 20 mg/dL Final   Creatinine, Ser 09/02/2023 1.01  0.61 - 1.24 mg/dL Final   Calcium  09/02/2023 9.2  8.9 - 10.3 mg/dL Final   Total Protein 92/72/7974 6.9  6.5 - 8.1 g/dL Final   Albumin 92/72/7974 4.1  3.5 - 5.0 g/dL Final   AST 92/72/7974  33  15 - 41 U/L Final   ALT 09/02/2023 26  0 - 44 U/L Final   Alkaline Phosphatase 09/02/2023 82  38 - 126 U/L Final   Total Bilirubin 09/02/2023 1.0  0.0 - 1.2 mg/dL Final   GFR, Estimated 09/02/2023 >60  >60 mL/min Final   Comment: (NOTE) Calculated using the CKD-EPI Creatinine Equation (2021)    Anion gap 09/02/2023 7  5 - 15 Final   Performed at [facility], [address], [place], [place] [address]   Alcohol, Ethyl (B) 09/02/2023 <15  <15 mg/dL Final   Comment: (NOTE) For medical purposes only. Performed at [facility], [address], [place], [place] [address]    WBC 09/02/2023 6.0  4.0 - 10.5 K/uL Final   RBC 09/02/2023 4.50  4.22 - 5.81 MIL/uL Final   Hemoglobin 09/02/2023 15.1  13.0 - 17.0 g/dL Final   HCT 92/72/7974 42.0  39.0 - 52.0 % Final   MCV 09/02/2023 93.3  80.0 - 100.0 fL Final   MCH 09/02/2023 33.6  26.0 - 34.0 pg Final   MCHC 09/02/2023 36.0  30.0 - 36.0 g/dL Final   RDW 92/72/7974 12.5  11.5 - 15.5 % Final   Platelets 09/02/2023 278  150 - 400 K/uL Final   nRBC 09/02/2023 0.0  0.0 - 0.2 % Final   Performed at [facility], [address] [place], [place] [address]   Opiates 09/02/2023 NONE DETECTED  NONE DETECTED Final   Cocaine 09/02/2023 NONE DETECTED  NONE DETECTED Final   Benzodiazepines 09/02/2023 NONE DETECTED  NONE DETECTED Final   Amphetamines 09/02/2023 NONE DETECTED  NONE DETECTED Final   Tetrahydrocannabinol 09/02/2023 POSITIVE (A)  NONE DETECTED Final   Barbiturates 09/02/2023 NONE DETECTED  NONE DETECTED Final   Comment: (NOTE) DRUG SCREEN FOR MEDICAL PURPOSES ONLY.  IF CONFIRMATION IS NEEDED FOR ANY PURPOSE, NOTIFY LAB WITHIN 5 DAYS.  LOWEST DETECTABLE LIMITS FOR URINE DRUG SCREEN Drug Class                      Cutoff (ng/mL) Amphetamine and metabolites    1000 Barbiturate and metabolites    200 Benzodiazepine                 200 Opiates and metabolites        300 Cocaine and metabolites        300 THC                            50 Performed at [facility], [address] [place], [place] [address]     PSYCHIATRIC REVIEW OF SYSTEMS (ROS)  ROS: Notable for the following relevant positive findings: ROS  Additional findings:      Musculoskeletal: No abnormal movements observed      Gait & Station: Normal      Pain Screening: Denies      Nutrition & Dental Concerns: pt. denies  RISK FORMULATION/ASSESSMENT  Is the patient experiencing any suicidal or homicidal ideations: No     Protective factors considered for safety management: family support  Risk factors/concerns considered for safety management:  Prior attempt Substance abuse/dependence Impulsivity Male gender Unmarried  Is there a safety management plan with the patient and treatment team to minimize risk factors and promote protective factors: Yes           Explain: inpatient treatment Is crisis care placement or psychiatric hospitalization recommended: Yes  Based on my current evaluation and risk assessment, patient is determined at this time to be at:  Moderate Risk  *RISK ASSESSMENT Risk assessment is a dynamic process; it is possible that this patient's condition, and risk level, may change. This should be re-evaluated and managed over time as appropriate. Please re-contact psychiatric consult services if additional assistance is needed in terms of risk assessment and management. If your team decides to discharge this patient, please advise the patient how to best access emergency psychiatric services, or to call 911, if their condition worsens or they feel unsafe in any way.   Bernardino LITTIE Erm, MD Telepsychiatry Consult Services

## 2023-09-03 ENCOUNTER — Encounter (HOSPITAL_COMMUNITY): Payer: Self-pay | Admitting: Nurse Practitioner

## 2023-09-03 DIAGNOSIS — F331 Major depressive disorder, recurrent, moderate: Secondary | ICD-10-CM | POA: Diagnosis present

## 2023-09-03 DIAGNOSIS — Z79899 Other long term (current) drug therapy: Secondary | ICD-10-CM | POA: Diagnosis not present

## 2023-09-03 DIAGNOSIS — F259 Schizoaffective disorder, unspecified: Secondary | ICD-10-CM | POA: Diagnosis not present

## 2023-09-03 DIAGNOSIS — Z5901 Sheltered homelessness: Secondary | ICD-10-CM | POA: Diagnosis not present

## 2023-09-03 MED ORDER — ALUM & MAG HYDROXIDE-SIMETH 200-200-20 MG/5ML PO SUSP
30.0000 mL | ORAL | Status: DC | PRN
Start: 2023-09-03 — End: 2023-09-03

## 2023-09-03 MED ORDER — HYDROXYZINE HCL 25 MG PO TABS
25.0000 mg | ORAL_TABLET | Freq: Three times a day (TID) | ORAL | Status: DC | PRN
Start: 2023-09-03 — End: 2023-09-03

## 2023-09-03 MED ORDER — BUDESON-GLYCOPYRROL-FORMOTEROL 160-9-4.8 MCG/ACT IN AERO
2.0000 | INHALATION_SPRAY | Freq: Two times a day (BID) | RESPIRATORY_TRACT | Status: DC
  Administered 2023-09-03 – 2023-09-06 (×5): 2 via RESPIRATORY_TRACT
  Filled 2023-09-03 (×2): qty 5.9

## 2023-09-03 MED ORDER — OLANZAPINE 10 MG PO TABS
10.0000 mg | ORAL_TABLET | Freq: Every day | ORAL | Status: DC
  Administered 2023-09-03 – 2023-09-05 (×3): 10 mg via ORAL
  Filled 2023-09-03 (×3): qty 1
  Filled 2023-09-03: qty 7

## 2023-09-03 MED ORDER — ACETAMINOPHEN 325 MG PO TABS: 650.0000 mg | ORAL_TABLET | Freq: Four times a day (QID) | ORAL | Status: DC | PRN

## 2023-09-03 MED ORDER — MAGNESIUM HYDROXIDE 400 MG/5ML PO SUSP
30.0000 mL | Freq: Every day | ORAL | Status: DC | PRN
Start: 2023-09-03 — End: 2023-09-03

## 2023-09-03 MED ORDER — HALOPERIDOL LACTATE 5 MG/ML IJ SOLN
10.0000 mg | Freq: Three times a day (TID) | INTRAMUSCULAR | Status: DC | PRN
Start: 2023-09-03 — End: 2023-09-03

## 2023-09-03 MED ORDER — LORATADINE 10 MG PO TABS: 10.0000 mg | ORAL_TABLET | Freq: Every day | ORAL | Status: DC | PRN

## 2023-09-03 MED ORDER — DIPHENHYDRAMINE HCL 50 MG PO CAPS
50.0000 mg | ORAL_CAPSULE | Freq: Three times a day (TID) | ORAL | Status: DC | PRN
Start: 2023-09-03 — End: 2023-09-03

## 2023-09-03 MED ORDER — CARVEDILOL 3.125 MG PO TABS
3.1250 mg | ORAL_TABLET | Freq: Two times a day (BID) | ORAL | Status: DC
  Administered 2023-09-03 – 2023-09-06 (×6): 3.125 mg via ORAL
  Filled 2023-09-03 (×5): qty 1
  Filled 2023-09-03: qty 14
  Filled 2023-09-03: qty 1

## 2023-09-03 MED ORDER — TRAZODONE HCL 50 MG PO TABS: 50.0000 mg | ORAL_TABLET | Freq: Every evening | ORAL | Status: DC | PRN

## 2023-09-03 MED ORDER — LORAZEPAM 2 MG/ML IJ SOLN
2.0000 mg | Freq: Three times a day (TID) | INTRAMUSCULAR | Status: DC | PRN
Start: 2023-09-03 — End: 2023-09-03

## 2023-09-03 MED ORDER — DIPHENHYDRAMINE HCL 50 MG/ML IJ SOLN
50.0000 mg | Freq: Three times a day (TID) | INTRAMUSCULAR | Status: DC | PRN
Start: 2023-09-03 — End: 2023-09-03

## 2023-09-03 MED ORDER — ALBUTEROL SULFATE HFA 108 (90 BASE) MCG/ACT IN AERS
1.0000 | INHALATION_SPRAY | RESPIRATORY_TRACT | Status: DC | PRN
  Filled 2023-09-03: qty 6.7

## 2023-09-03 MED ORDER — HALOPERIDOL LACTATE 5 MG/ML IJ SOLN
5.0000 mg | Freq: Three times a day (TID) | INTRAMUSCULAR | Status: DC | PRN
Start: 2023-09-03 — End: 2023-09-03

## 2023-09-03 MED ORDER — ATORVASTATIN CALCIUM 10 MG PO TABS
20.0000 mg | ORAL_TABLET | Freq: Every day | ORAL | Status: DC
Start: 2023-09-03 — End: 2023-09-03

## 2023-09-03 MED ORDER — ROSUVASTATIN CALCIUM 5 MG PO TABS
10.0000 mg | ORAL_TABLET | Freq: Every day | ORAL | Status: DC
  Administered 2023-09-03 – 2023-09-06 (×4): 10 mg via ORAL
  Filled 2023-09-03 (×4): qty 2

## 2023-09-03 MED ORDER — HALOPERIDOL 5 MG PO TABS
5.0000 mg | ORAL_TABLET | Freq: Three times a day (TID) | ORAL | Status: DC | PRN
Start: 2023-09-03 — End: 2023-09-03

## 2023-09-03 NOTE — ED Provider Notes (Addendum)
 Facility Based Crisis Admission H&P  Date: 09/03/23 Patient Name: Wesley Beck MRN: [id number] Chief Complaint: It's my family man, they treat me like a dog.   Diagnoses:  Final diagnoses:  Schizoaffective disorder, unspecified type (HCC)  Cannabis dependence (HCC)    HPI: 53 year old patient with PPH bipolar disorder, schizoaffective disorder, paranoid personality and antisocial personality traits who presented to ED yesterday with a chief complaint of SI.   PER ED note yesterday: Patient states that he is unhappy about his family not treating him well.  He then starts talking about how his son's family, his dad has family, and that they are trying to screw him.  Patient is not being treated well by them.  He states that he is taking his medications.  He was seen in the ER few days back, they recommended that patient be admitted to [facility], but patient eloped.  He states that he left because the shuttle was not in make a pit stop for patient to grab his meds.  When asked if patient had any SI currently, he states that he has had thoughts about cutting his throat.  He also states that he is an organ donor and has good liver.   Patient notes primary stressor of living outside of family house in a shed and is unhappy about his living circumstances. Reports history of numerous psychiatric admissions including at [facility] and states he has been taking Zyprexa  5 mg at bedtime instead of BID as it is prescribed. 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  Patient perseverates that I'm not going back to [facility] and stating that he needs to make money to pay of his son's debts. 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 Patient perseverates on perceived mistreatments and states if he has to go back to [employer] just give up my organs... I'm ready to give up my organs. When asked about SI patient talks about giving up his organs but does not  voice any other suicidal plan or intent. Denies HI however states If they were in front of my face I would smack them... I'm done with them. Patient denies significant depression or anxiety however reports feeling hopeless. Patient reports possible history of mania however is a vague historian. States Zyprexa  helps with the voices it slows me down. Denies difficulty with sleep, appetite or energy.    PHQ 2-9:   Flowsheet Row ED from 09/02/2023 in [facility] Most recent reading at 09/02/2023 11:47 PM ED from 09/02/2023 in [facility] Emergency Department at [facility] Most recent reading at 09/02/2023 11:54 AM ED from 08/23/2023 in [facility] Emergency Department at [facility] Most recent reading at 08/23/2023  1:12 AM  C-SSRS RISK CATEGORY Error: Q3, 4, or 5 should not be populated when Q2 is No High Risk High Risk      Total Time spent with patient: 45 minutes  Musculoskeletal  Strength & Muscle Tone: within normal limits Gait & Station: normal Patient leans: N/A  Psychiatric Specialty Exam  Presentation General Appearance: Bizarre  Eye Contact:Fleeting  Speech:Normal Rate  Speech Volume:Increased  Handedness:No data recorded  Mood and Affect  Mood:Anxious; Irritable  Affect:Labile   Thought Process  Thought Processes:Disorganized  Descriptions of Associations:Circumstantial  Orientation:Full (Time, Place and Person)  Thought Content:Delusions; Paranoid Ideation; Rumination  Diagnosis of Schizophrenia or Schizoaffective disorder in past: Yes   Hallucinations:Hallucinations: Auditory  Ideas of Reference:Paranoia  Suicidal Thoughts:Suicidal Thoughts: Yes, Passive  Homicidal Thoughts:Homicidal Thoughts: No   Sensorium  Memory:Immediate Fair  Judgment:Fair  Insight:Fair   Executive Functions  Concentration:Fair  Attention Span:Fair  Recall:Fair  Fund of Knowledge:Fair  Language:Fair   Psychomotor  Activity  Psychomotor Activity:Psychomotor Activity: Normal   Assets  Assets:Communication Skills; Desire for Improvement; Resilience   Sleep  Sleep:Sleep: Fair   No data recorded  Physical exam: Please see exam on admit note. General: Well developed, well nourished.  Pupils: Normal at 3mm Respiratory: Breathing is unlabored.  Cardiovascular: No edema.  Language: No anomia, no aphasia Muscle strength and tone-pt moving all extremities.  Gait not assessed as pt remained in bed.  Neuro: Facial muscles are symmetric. Pt without tremor, no evidence of hyperarousal.  Review of Systems  Constitutional: Negative.   HENT: Negative.    Eyes: Negative.   Respiratory: Negative.    Cardiovascular: Negative.   Gastrointestinal: Negative.   Genitourinary: Negative.   Musculoskeletal: Negative.   Skin: Negative.   Neurological: Negative.   Endo/Heme/Allergies: Negative.   Psychiatric/Behavioral:  Positive for depression, substance abuse and suicidal ideas.     Blood pressure (!) 143/93, pulse 82, temperature 98.2 F (36.8 C), temperature source Oral, resp. rate 19, SpO2 91%. There is no height or weight on file to calculate BMI.  Past Psychiatric History:   Reports multiple past psychiatric admissions and notes hx of involuntary commitment, reports he's found it helpful. Current medications: Reports he takes zyprexa  5mg  po at bedtime and then when I feel like I need it qAM. Patient was last discharged from [facility] on Zyprexa  10 mg at bedtime. He reports hx of abilify use for approximately 15 years is not sure if it was helpful - prior suicide attempts. Last attempted suicide approximately 25 years ago by overdose of pills.    Is the patient at risk to self? Yes  Has the patient been a risk to self in the past 6 months? Yes .    Has the patient been a risk to self within the distant past? Yes   Is the patient a risk to others? No   Has the patient been a risk to others in the past 6  months? No   Has the patient been a risk to others within the distant past? No   Past Medical History:  Past Medical History:  Diagnosis Date   Arthritis    Asthma    Bipolar 1 disorder (HCC)    Chronic back pain    Depression    Schizophrenia, paranoid type (HCC)    Seasonal allergies     Family History:  Patient denies significant psychiatric family history.   Social History: living in shed outside family's house. History of incarceration for theft.  Last Labs:  Admission on 09/02/2023, Discharged on 09/02/2023  Component Date Value Ref Range Status   Sodium 09/02/2023 138  135 - 145 mmol/L Final   Potassium 09/02/2023 3.9  3.5 - 5.1 mmol/L Final   Chloride 09/02/2023 106  98 - 111 mmol/L Final   CO2 09/02/2023 25  22 - 32 mmol/L Final   Glucose, Bld 09/02/2023 97  70 - 99 mg/dL Final   Glucose reference range applies only to samples taken after fasting for at least 8 hours.   BUN 09/02/2023 11  6 - 20 mg/dL Final   Creatinine, Ser 09/02/2023 1.01  0.61 - 1.24 mg/dL Final   Calcium  09/02/2023 9.2  8.9 - 10.3 mg/dL Final   Total Protein  09/02/2023 6.9  6.5 - 8.1 g/dL Final   Albumin 92/72/7974 4.1  3.5 - 5.0 g/dL Final   AST 92/72/7974 33  15 - 41 U/L Final   ALT 09/02/2023 26  0 - 44 U/L Final   Alkaline Phosphatase 09/02/2023 82  38 - 126 U/L Final   Total Bilirubin 09/02/2023 1.0  0.0 - 1.2 mg/dL Final   GFR, Estimated 09/02/2023 >60  >60 mL/min Final   Comment: (NOTE) Calculated using the CKD-EPI Creatinine Equation (2021)    Anion gap 09/02/2023 7  5 - 15 Final   Performed at [facility], [address] [place], [place] [address]   Alcohol, Ethyl (B) 09/02/2023 <15  <15 mg/dL Final   Comment: (NOTE) For medical purposes only. Performed at [facility], [address] [place], [place] [address]    WBC 09/02/2023 6.0  4.0 - 10.5 K/uL Final   RBC 09/02/2023 4.50  4.22 - 5.81 MIL/uL Final   Hemoglobin 09/02/2023 15.1  13.0 - 17.0 g/dL Final   HCT 92/72/7974 42.0   39.0 - 52.0 % Final   MCV 09/02/2023 93.3  80.0 - 100.0 fL Final   MCH 09/02/2023 33.6  26.0 - 34.0 pg Final   MCHC 09/02/2023 36.0  30.0 - 36.0 g/dL Final   RDW 92/72/7974 12.5  11.5 - 15.5 % Final   Platelets 09/02/2023 278  150 - 400 K/uL Final   nRBC 09/02/2023 0.0  0.0 - 0.2 % Final   Performed at [facility], [address] [place], [place] [address]   Opiates 09/02/2023 NONE DETECTED  NONE DETECTED Final   Cocaine 09/02/2023 NONE DETECTED  NONE DETECTED Final   Benzodiazepines 09/02/2023 NONE DETECTED  NONE DETECTED Final   Amphetamines 09/02/2023 NONE DETECTED  NONE DETECTED Final   Tetrahydrocannabinol 09/02/2023 POSITIVE (A)  NONE DETECTED Final   Barbiturates 09/02/2023 NONE DETECTED  NONE DETECTED Final   Comment: (NOTE) DRUG SCREEN FOR MEDICAL PURPOSES ONLY.  IF CONFIRMATION IS NEEDED FOR ANY PURPOSE, NOTIFY LAB WITHIN 5 DAYS.  LOWEST DETECTABLE LIMITS FOR URINE DRUG SCREEN Drug Class                     Cutoff (ng/mL) Amphetamine and metabolites    1000 Barbiturate and metabolites    200 Benzodiazepine                 200 Opiates and metabolites        300 Cocaine and metabolites        300 THC                            50 Performed at [facility], [address], [place], [place] [address]   Admission on 08/23/2023, Discharged on 08/23/2023  Component Date Value Ref Range Status   Sodium 08/23/2023 139  135 - 145 mmol/L Final   Potassium 08/23/2023 3.8  3.5 - 5.1 mmol/L Final   Chloride 08/23/2023 100  98 - 111 mmol/L Final   CO2 08/23/2023 28  22 - 32 mmol/L Final   Glucose, Bld 08/23/2023 89  70 - 99 mg/dL Final   Glucose reference range applies only to samples taken after fasting for at least 8 hours.   BUN 08/23/2023 14  6 - 20 mg/dL Final   Creatinine, Ser 08/23/2023 1.01  0.61 - 1.24 mg/dL Final   Calcium  08/23/2023 9.3  8.9 - 10.3 mg/dL Final   Total  Protein 08/23/2023 6.8  6.5 - 8.1 g/dL Final   Albumin 92/82/7974 4.0  3.5 - 5.0 g/dL Final   AST  92/82/7974 34  15 - 41 U/L Final   ALT 08/23/2023 33  0 - 44 U/L Final   Alkaline Phosphatase 08/23/2023 89  38 - 126 U/L Final   Total Bilirubin 08/23/2023 0.7  0.0 - 1.2 mg/dL Final   GFR, Estimated 08/23/2023 >60  >60 mL/min Final   Comment: (NOTE) Calculated using the CKD-EPI Creatinine Equation (2021)    Anion gap 08/23/2023 11  5 - 15 Final   Performed at [facility], [address], [place], [place] [address]   Alcohol, Ethyl (B) 08/23/2023 <15  <15 mg/dL Final   Comment: (NOTE) For medical purposes only. Performed at [facility], [address] [place], [place] [address]    WBC 08/23/2023 8.4  4.0 - 10.5 K/uL Final   RBC 08/23/2023 4.89  4.22 - 5.81 MIL/uL Final   Hemoglobin 08/23/2023 16.3  13.0 - 17.0 g/dL Final   HCT 92/82/7974 47.5  39.0 - 52.0 % Final   MCV 08/23/2023 97.1  80.0 - 100.0 fL Final   MCH 08/23/2023 33.3  26.0 - 34.0 pg Final   MCHC 08/23/2023 34.3  30.0 - 36.0 g/dL Final   RDW 92/82/7974 12.8  11.5 - 15.5 % Final   Platelets 08/23/2023 302  150 - 400 K/uL Final   nRBC 08/23/2023 0.0  0.0 - 0.2 % Final   Performed at [facility], [address] [place], [place] [address]   Opiates 08/23/2023 NONE DETECTED  NONE DETECTED Final   Cocaine 08/23/2023 NONE DETECTED  NONE DETECTED Final   Benzodiazepines 08/23/2023 NONE DETECTED  NONE DETECTED Final   Amphetamines 08/23/2023 NONE DETECTED  NONE DETECTED Final   Tetrahydrocannabinol 08/23/2023 POSITIVE (A)  NONE DETECTED Final   Barbiturates 08/23/2023 NONE DETECTED  NONE DETECTED Final   Comment: (NOTE) DRUG SCREEN FOR MEDICAL PURPOSES ONLY.  IF CONFIRMATION IS NEEDED FOR ANY PURPOSE, NOTIFY LAB WITHIN 5 DAYS.  LOWEST DETECTABLE LIMITS FOR URINE DRUG SCREEN Drug Class                     Cutoff (ng/mL) Amphetamine and metabolites    1000 Barbiturate and metabolites    200 Benzodiazepine                 200 Opiates and metabolites        300 Cocaine and metabolites        300 THC                             50 Performed at [facility], [address] [place], [place] [address]   Admission on 07/14/2023, Discharged on 07/15/2023  Component Date Value Ref Range Status   Sodium 07/14/2023 138  135 - 145 mmol/L Final   Potassium 07/14/2023 3.5  3.5 - 5.1 mmol/L Final   Chloride 07/14/2023 104  98 - 111 mmol/L Final   CO2 07/14/2023 22  22 - 32 mmol/L Final   Glucose, Bld 07/14/2023 98  70 - 99 mg/dL Final   Glucose reference range applies only to samples taken after fasting for at least 8 hours.   BUN 07/14/2023 11  6 - 20 mg/dL Final   Creatinine, Ser 07/14/2023 0.95  0.61 - 1.24 mg/dL Final   Calcium  07/14/2023 9.4  8.9 - 10.3 mg/dL Final  Total Protein 07/14/2023 7.2  6.5 - 8.1 g/dL Final   Albumin 93/92/7974 4.4  3.5 - 5.0 g/dL Final   AST 93/92/7974 63 (H)  15 - 41 U/L Final   ALT 07/14/2023 49 (H)  0 - 44 U/L Final   Alkaline Phosphatase 07/14/2023 89  38 - 126 U/L Final   Total Bilirubin 07/14/2023 1.1  0.0 - 1.2 mg/dL Final   GFR, Estimated 07/14/2023 >60  >60 mL/min Final   Comment: (NOTE) Calculated using the CKD-EPI Creatinine Equation (2021)    Anion gap 07/14/2023 12  5 - 15 Final   Performed at [facility], [address], [place], [place] [address]   Alcohol, Ethyl (B) 07/14/2023 <15  <15 mg/dL Final   Comment: (NOTE) For medical purposes only. Performed at [facility], [address], [place], [place] [address]    Opiates 07/14/2023 NONE DETECTED  NONE DETECTED Final   Cocaine 07/14/2023 NONE DETECTED  NONE DETECTED Final   Benzodiazepines 07/14/2023 NONE DETECTED  NONE DETECTED Final   Amphetamines 07/14/2023 NONE DETECTED  NONE DETECTED Final   Tetrahydrocannabinol 07/14/2023 POSITIVE (A)  NONE DETECTED Final   Barbiturates 07/14/2023 NONE DETECTED  NONE DETECTED Final   Comment: (NOTE) DRUG SCREEN FOR MEDICAL PURPOSES ONLY.  IF CONFIRMATION IS NEEDED FOR ANY PURPOSE, NOTIFY LAB WITHIN 5 DAYS.  LOWEST DETECTABLE LIMITS FOR URINE DRUG SCREEN Drug Class                      Cutoff (ng/mL) Amphetamine and metabolites    1000 Barbiturate and metabolites    200 Benzodiazepine                 200 Opiates and metabolites        300 Cocaine and metabolites        300 THC                            50 Performed at [facility], [address] [place], [place] [address]    WBC 07/14/2023 6.9  4.0 - 10.5 K/uL Final   RBC 07/14/2023 4.54  4.22 - 5.81 MIL/uL Final   Hemoglobin 07/14/2023 14.8  13.0 - 17.0 g/dL Final   HCT 93/92/7974 42.0  39.0 - 52.0 % Final   MCV 07/14/2023 92.5  80.0 - 100.0 fL Final   MCH 07/14/2023 32.6  26.0 - 34.0 pg Final   MCHC 07/14/2023 35.2  30.0 - 36.0 g/dL Final   RDW 93/92/7974 12.5  11.5 - 15.5 % Final   Platelets 07/14/2023 314  150 - 400 K/uL Final   nRBC 07/14/2023 0.0  0.0 - 0.2 % Final   Neutrophils Relative % 07/14/2023 54  % Final   Neutro Abs 07/14/2023 3.8  1.7 - 7.7 K/uL Final   Lymphocytes Relative 07/14/2023 23  % Final   Lymphs Abs 07/14/2023 1.6  0.7 - 4.0 K/uL Final   Monocytes Relative 07/14/2023 11  % Final   Monocytes Absolute 07/14/2023 0.8  0.1 - 1.0 K/uL Final   Eosinophils Relative 07/14/2023 10  % Final   Eosinophils Absolute 07/14/2023 0.7 (H)  0.0 - 0.5 K/uL Final   Basophils Relative 07/14/2023 2  % Final   Basophils Absolute 07/14/2023 0.1  0.0 - 0.1 K/uL Final   Immature Granulocytes 07/14/2023 0  % Final   Abs Immature Granulocytes 07/14/2023 0.02  0.00 - 0.07 K/uL Final   Performed at  [facility], [address] [place], [place] [address]   Salicylate Lvl 07/14/2023 <7.0 (L)  7.0 - 30.0 mg/dL Final   Performed at [facility], [address] [place], [place] [address]   Acetaminophen  (Tylenol ), Serum 07/14/2023 <10 (L)  10 - 30 ug/mL Final   Comment: (NOTE) Therapeutic concentrations vary significantly. A range of 10-30 ug/mL  may be an effective concentration for many patients. However, some  are best treated at concentrations outside of this range. Acetaminophen  concentrations  >150 ug/mL at 4 hours after ingestion  and >50 ug/mL at 12 hours after ingestion are often associated with  toxic reactions.  Performed at [facility], [address], [place], [place] [address]     Allergies: Cat dander and Dog epithelium (canis lupus familiaris)  Medications:  Facility Ordered Medications  Medication   acetaminophen  (TYLENOL ) tablet 650 mg   alum & mag hydroxide-simeth (MAALOX/MYLANTA) 200-200-20 MG/5ML suspension 30 mL   magnesium  hydroxide (MILK OF MAGNESIA) suspension 30 mL   haloperidol  (HALDOL ) tablet 5 mg   And   diphenhydrAMINE  (BENADRYL ) capsule 50 mg   haloperidol  lactate (HALDOL ) injection 5 mg   And   diphenhydrAMINE  (BENADRYL ) injection 50 mg   And   LORazepam  (ATIVAN ) injection 2 mg   haloperidol  lactate (HALDOL ) injection 10 mg   And   diphenhydrAMINE  (BENADRYL ) injection 50 mg   And   LORazepam  (ATIVAN ) injection 2 mg   hydrOXYzine  (ATARAX ) tablet 25 mg   traZODone  (DESYREL ) tablet 50 mg   albuterol  (VENTOLIN  HFA) 108 (90 Base) MCG/ACT inhaler 1-2 puff   atorvastatin  (LIPITOR) tablet 20 mg   carvedilol  (COREG ) tablet 3.125 mg   loratadine  (CLARITIN ) tablet 10 mg   OLANZapine  (ZYPREXA ) tablet 10 mg   PTA Medications  Medication Sig   OLANZapine  (ZYPREXA ) 5 MG tablet Take 5 mg by mouth in the morning and at bedtime.   carvedilol  (COREG ) 3.125 MG tablet Take 3.125 mg by mouth 2 (two) times daily with a meal.   TRELEGY ELLIPTA 100-62.5-25 MCG/ACT AEPB Inhale 1 puff into the lungs daily.   meloxicam (MOBIC) 7.5 MG tablet Take 7.5 mg by mouth 2 (two) times daily as needed for pain.   hydrOXYzine  (ATARAX ) 25 MG tablet Take 25 mg by mouth 3 (three) times daily as needed for anxiety.   loratadine  (CLARITIN ) 10 MG tablet Take 10 mg by mouth daily as needed for allergies.   atorvastatin  (LIPITOR) 20 MG tablet Take 20 mg by mouth See admin instructions. Take it on Sunday, Tuesday, Wednesday, Thursday, and Saturday as needed for high cholesterol.    rosuvastatin  (CRESTOR ) 10 MG tablet Take 10 mg by mouth 2 (two) times a week. Monday and Friday   thiamine (VITAMIN B1) 100 MG tablet Take 100 mg by mouth daily.   albuterol  (VENTOLIN  HFA) 108 (90 Base) MCG/ACT inhaler Inhale 1-2 puffs into the lungs every 4 (four) hours as needed for wheezing or shortness of breath.   ibuprofen  (ADVIL ) 400 MG tablet Take 400 mg by mouth every 6 (six) hours as needed for mild pain (pain score 1-3).    Long Term Goals: Improvement in symptoms so as ready for discharge  Short Term Goals: Patient will verbalize feelings in meetings with treatment team members., Patient will attend at least of 50% of the groups daily., Pt will complete the PHQ9 on admission, day 3 and discharge., Patient will participate in completing the [place] Suicide Severity Rating Scale, Patient will score a low risk of violence for 24  hours prior to discharge, and Patient will take medications as prescribed daily.  Medical Decision Making  Patient has decision making capacity    53 year old patient with PPH bipolar disorder, schizoaffective disorder, paranoid personality and antisocial personality traits who presented to ED yesterday with a chief complaint of SI. Patient admits to cannabis abuse and admits to it making him more paranoid. States he would like to get sober. Agreeable to taking Zyprexa  and does not wish to switch to alternative antipsychotic. Voices vague SI without a plan but with thoughts of giving up my organs if I have to go to [employer]. Has history of 1 suicide attempts via OD. Patient with similar presentations and antisocial and paranoid personality traits on multiple prior presentations. Denies HI today.  Schizoaffective disorder versus paranoid personality disorder -increase Zyprexa  to 10 mg at bedtime  Cannabis dependence, rule out cannabis induced psychosis  Medical -restarted home medications including rosuvastatin  and Coreg  as well as albuterol   inhaler  Recommendations  Based on my evaluation the patient does not appear to have an emergency medical condition.  Zhoey Blackstock, MD 09/03/23  6:52 PM

## 2023-09-03 NOTE — ED Notes (Signed)
 Pt denies si hi and avh. Pt denies physical pain and discomfort. Pt denies constipation, however, last BM 09/01/23. Pt displays reserved affect, says he is doing 'okay' today.

## 2023-09-03 NOTE — Group Note (Signed)
 Group Topic: Recovery Basics  Group Date: 09/03/2023 Start Time: 2000 End Time: 2030 Facilitators: Joan Plowman B  Department: [facility]  Number of Participants: 5  Group Focus: abuse issues, chemical dependency issues, concentration, coping skills, daily focus, goals/reality orientation, personal responsibility, relapse prevention, and social skills Treatment Modality:  Individual Therapy Interventions utilized were leisure development, patient education, story telling, and support Purpose: enhance coping skills, express feelings, express irrational fears, increase insight, relapse prevention strategies, and trigger / craving management  Name: Wesley Beck Date of Birth: Jan 30, 1971  MR: [id number]    Level of Participation:  PT DID NOT ATTEND GROUP Quality of Participation: cooperative Interactions with others: gave feedback Mood/Affect: appropriate Triggers (if applicable): NA Cognition: coherent/clear Progress: None Response: NA Plan: patient will be encouraged to go to groups  Patients Problems:  Patient Active Problem List   Diagnosis Date Noted   MDD (major depressive disorder), recurrent episode, moderate (HCC) 09/03/2023   Schizophrenia (HCC) 09/02/2023   Elevated troponin 10/21/2021   Substance abuse (HCC) 10/21/2021   GERD (gastroesophageal reflux disease) 10/21/2021   SVT (supraventricular tachycardia) (HCC) 10/21/2021   Leukocytosis 10/21/2021   Hypoxia    Acute respiratory failure with hypoxia and hypercarbia (HCC)    Respiratory failure (HCC) 10/07/2021   Schizoaffective disorder (HCC) 04/03/2018   Cannabis abuse 04/03/2018   Suicidal behavior with attempted self-injury (HCC) 04/02/2018   Bipolar 1 disorder, mixed (HCC) 02/28/2013   DISORDER, BIPOLAR NOS 06/05/2006   TOBACCO ABUSE 06/05/2006   Allergic rhinitis 06/05/2006   Asthma 06/05/2006   SEBACEOUS CYST 06/05/2006   LOW BACK PAIN, CHRONIC 06/05/2006   MALAISE AND  FATIGUE 06/05/2006

## 2023-09-03 NOTE — Group Note (Signed)
 Group Topic: Understanding Self  Group Date: 09/03/2023 Start Time: 1800 End Time: 8164 Facilitators: Daved Tinnie HERO, RN  Department: [facility]  Number of Participants: 6  Group Focus: nursing group Treatment Modality:  Psychoeducation Interventions utilized were exploration, group exercise, and story telling Purpose: increase insight  Name: Wesley Beck Date of Birth: 02/11/1970  MR: [id number]    Level of Participation: moderate Quality of Participation: attentive Interactions with others: pt left group early to speak with medical provider Mood/Affect: appropriate Triggers (if applicable): n/a Cognition: coherent/clear Progress: Gaining insight Plan: patient will be encouraged to attend future nursing education groups  Patients Problems:  Patient Active Problem List   Diagnosis Date Noted   Schizophrenia (HCC) 09/02/2023   Elevated troponin 10/21/2021   Substance abuse (HCC) 10/21/2021   GERD (gastroesophageal reflux disease) 10/21/2021   SVT (supraventricular tachycardia) (HCC) 10/21/2021   Leukocytosis 10/21/2021   Hypoxia    Acute respiratory failure with hypoxia and hypercarbia (HCC)    Respiratory failure (HCC) 10/07/2021   Schizoaffective disorder (HCC) 04/03/2018   Cannabis abuse 04/03/2018   Suicidal behavior with attempted self-injury (HCC) 04/02/2018   Bipolar 1 disorder, mixed (HCC) 02/28/2013   DISORDER, BIPOLAR NOS 06/05/2006   TOBACCO ABUSE 06/05/2006   Allergic rhinitis 06/05/2006   Asthma 06/05/2006   SEBACEOUS CYST 06/05/2006   LOW BACK PAIN, CHRONIC 06/05/2006   MALAISE AND FATIGUE 06/05/2006

## 2023-09-03 NOTE — Group Note (Signed)
 Group Topic: Positive Affirmations  Group Date: 09/03/2023 Start Time: 9184 End Time: 0830 Facilitators: Lonzell Dwayne RAMAN, NT  Department: [facility]  Number of Participants: 1  Group Focus: feeling awareness/expression Treatment Modality:  Individual Therapy Interventions utilized were patient education Purpose: regain self-worth  Name: Wesley Beck Date of Birth: 1970-11-29  MR: [id number]    Level of Participation: Patient did not attend group Quality of Participation: N/A Interactions with others: N/A Mood/Affect: N/A Triggers (if applicable): N/A Cognition: N/A Progress: N/A Response: N/A Plan: N/A  Patients Problems:  Patient Active Problem List   Diagnosis Date Noted   Schizophrenia (HCC) 09/02/2023   Elevated troponin 10/21/2021   Substance abuse (HCC) 10/21/2021   GERD (gastroesophageal reflux disease) 10/21/2021   SVT (supraventricular tachycardia) (HCC) 10/21/2021   Leukocytosis 10/21/2021   Hypoxia    Acute respiratory failure with hypoxia and hypercarbia (HCC)    Respiratory failure (HCC) 10/07/2021   Schizoaffective disorder (HCC) 04/03/2018   Cannabis abuse 04/03/2018   Suicidal behavior with attempted self-injury (HCC) 04/02/2018   Bipolar 1 disorder, mixed (HCC) 02/28/2013   DISORDER, BIPOLAR NOS 06/05/2006   TOBACCO ABUSE 06/05/2006   Allergic rhinitis 06/05/2006   Asthma 06/05/2006   SEBACEOUS CYST 06/05/2006   LOW BACK PAIN, CHRONIC 06/05/2006   MALAISE AND FATIGUE 06/05/2006

## 2023-09-03 NOTE — ED Notes (Signed)
 Pt ate lunch. Pt currently watching TV in dayroom with other pts. No complaints or concerns reported to RN this shift thus far. No distress observed.

## 2023-09-03 NOTE — ED Notes (Signed)
 Patient is sleeping. Respirations equal and unlabored. No change in assessment or acuity. Routine safety checks conducted according to facility protocol.

## 2023-09-03 NOTE — ED Notes (Signed)
 Patient asleep in the bedroom.NAD. Will continue to monitor for safety

## 2023-09-03 NOTE — Discharge Instructions (Signed)
 You are scheduled for a follow up appointment at [facility] on Monday, August 4 @ 1:00. Please arrive 15 minutes early and bring a photo ID and copy of insurance and or social security card.   [facility]   [address] [place], [place] [address] Mailing Address: [address], [place], [place] [address] Hours: Mon-Fri 8AM-5PM Phone: [phone] Fax: [phone] Services  Adult Services Advanced Access Walk-In Assessments - All Disabilities Routine Outpatient Therapy Psychiatry/Med Management Substance Abuse Intensive Outpatient (SAIOP) Mobile Crisis Assertive Community Treatment Team (ACTT) Jail Services Medication Assisted Treatment - MAT (Suboxone) Tailored Care Management (TCM)  Walk in's are welcome Mon-Friday from 8-5:00pm and are recommended to arrive closest to 8am as possible for a clinical evaluation for recommendations for their psychiatry outpatient therapy services.   Hours of operation are designed to be available Monday through Friday, from 8:00 AM until 5:00 PM at a minimum, and  thru 8:00 PM in many locations.  Additionally, we often provide services in the community in order to improve accessibility for consumers.  Individuals and families served are informed about the availability of 24/7/365 services available through Emergency, Crisis, and Mobile Crisis services.  [facility], [employer] is a comprehensive community provider of evidence-based and culturally-affirmative mental health and substance abuse services.  Our Outpatient Program philosophy is one in which medical and behavioral healthcare professionals empower consumers with current best practices and effective, evidence-based treatment programs to effect self-determination, and quality of life.  [facility]  [facility] [address] [place], [place], [address] [phone] phone  Offers food and emergency or transitional housing to men, women, or families in need. Clients participate in  programs and workshops developed to promote self-sufficiency and personal development.Call or walk in. Applications are accepted Monday, Wednesday, and Friday by appointment only. Need photo ID and proof of income.  [facility] - [facility] [address], [place], [place] [address] [phone] Population served: Adult men & women (53 years old and older, able to perform activities for daily living) Documents required: Valid ID & Social Security Card  [facility] [address] [place], [place]  [address] [phone] Population served: Families with children  [facility] [address] [place], [place]  [address] [phone] Population served: Single women 18+ without dependents Documents required:  Valid ID & Social Security Card  [facility] [address], [place], [place]  [address] [phone] Population served: Male veterans 18+ with substance abuse/mental health issues Eligibility: By referral only  [facility] [address] [place], [place] [address] [phone] Population served: Males 18+ Documents required: Valid ID & Social Security Card  Room at [employer] [facility] [employer] [employer] [address], [place] [address] [phone] or [phone] Population served: Pregnant women with or without children  Documents required: Valid ID & Social Security [employer] of [employer] [address] [place], [place] [address] [phone] Population Served: Families with children  [facility] [address] [place], [place] [address] [phone] Population served: Men 18+, preference for disabled and/or veterans Eligibility: By referral only  [facility]) - [facility] [address] [place], [place], [place] [address] [phone] or [phone] Population served:  Families with children.   Shelters in [facility] [employer].greensborourbanministry.org  [facility] www.odm-hp.org  [facility] [employer] www.piedmontrescuemission.org  [employer] [facility] [place].com  [employer] www.helpinc-centeragainstviolence.org   Shelters  [employer].greensborourbanministry.org  [facility] www.odm-hp.org  [facility] www.piedmontrescuemission.org  [employer] [facility] [place].com  [employer] www.helpinc-centeragainstviolence.org  Transportation in [place]  Health  [facility] www.[place]publichealth.org  [facility] www.daymarkrecovery.org  [employer].pel[place]transportation.com  [employer].no.html    Housing  [employer] http://williams.net/  [employer] Housing Search www.nchousingsearch.com

## 2023-09-03 NOTE — Discharge Planning (Signed)
 LCSW met with patient to assess current mood, affect, physical state, and inquire about needs/goals while here in [facility] and after discharge.  Patient reports he presented due to tired of being hassled by his father and stepmother. Patient reports he has been living in a shed behind his father's home off and on for the past 10 years. He reports having a 52 year old son who is of some support.   Patient reports using cannabis and no other substances. Patient stated he receives disability for 900$ due to dx: schizophrenia.  Patient stated he does not drive since his license is expired. Patient denies having any legal issues.   Patient stated he would like to have a list of shelters and is not sure if he wants to return to the shed at his father's upon DC and voices understanding of being here for med stabilization. Patient reported to use outpatient services at [facility] in [place] for over 20 years.  Patient reports his/her current goal is to return to his home or a shelter with continued outpatient mental health services for medication management. . Patient denies any prior history of outpatient or inpatient substance abuse treatment. Patient currently denies any SI/HI/AVH.   SW will arrange follow up appointment with [facility] upon his discharge when stable.  Patient expressed understanding and appreciation of LCSW assistance. No other needs were reported at this time by patient.

## 2023-09-04 DIAGNOSIS — F259 Schizoaffective disorder, unspecified: Secondary | ICD-10-CM

## 2023-09-04 DIAGNOSIS — Z79899 Other long term (current) drug therapy: Secondary | ICD-10-CM | POA: Diagnosis not present

## 2023-09-04 DIAGNOSIS — Z5901 Sheltered homelessness: Secondary | ICD-10-CM | POA: Diagnosis not present

## 2023-09-04 DIAGNOSIS — F1220 Cannabis dependence, uncomplicated: Secondary | ICD-10-CM | POA: Diagnosis not present

## 2023-09-04 NOTE — ED Notes (Signed)
 Pt alert and cooperative. Visible all shift. Observed attending groups. Intrusive at times at the nurses station. Irritable at times but behavior is easy re-directed. Will continue to monitor and report changes as noted

## 2023-09-04 NOTE — ED Notes (Signed)
 Pt alert and cooperative.  Patient focus is on being discharged and he offers no other concerns. Behavior has been controlled. Denies si/hi/avh. Will continue to monitor and report changes as noted.

## 2023-09-04 NOTE — Group Note (Signed)
 Group Topic: Wellness  Group Date: 09/04/2023 Start Time: 1400 End Time: 1430 Facilitators: Markon Jares, Cloyd SAUNDERS, RN  Department: [facility]  Number of Participants: 7  Group Focus: other healthy diet Treatment Modality:  Leisure Development Interventions utilized were story telling Purpose: increase insight  Name: Wesley Beck Date of Birth: 1970/05/10  MR: [id number]    Level of Participation: active Quality of Participation: cooperative Interactions with others: gave feedback Mood/Affect: appropriate Triggers (if applicable): his father Cognition: coherent/clear Progress: Moderate Response: na Plan: follow-up needed  Patients Problems:  Patient Active Problem List   Diagnosis Date Noted   MDD (major depressive disorder), recurrent episode, moderate (HCC) 09/03/2023   Schizophrenia (HCC) 09/02/2023   Elevated troponin 10/21/2021   Substance abuse (HCC) 10/21/2021   GERD (gastroesophageal reflux disease) 10/21/2021   SVT (supraventricular tachycardia) (HCC) 10/21/2021   Leukocytosis 10/21/2021   Hypoxia    Acute respiratory failure with hypoxia and hypercarbia (HCC)    Respiratory failure (HCC) 10/07/2021   Schizoaffective disorder (HCC) 04/03/2018   Cannabis abuse 04/03/2018   Suicidal behavior with attempted self-injury (HCC) 04/02/2018   Bipolar 1 disorder, mixed (HCC) 02/28/2013   DISORDER, BIPOLAR NOS 06/05/2006   TOBACCO ABUSE 06/05/2006   Allergic rhinitis 06/05/2006   Asthma 06/05/2006   SEBACEOUS CYST 06/05/2006   LOW BACK PAIN, CHRONIC 06/05/2006   MALAISE AND FATIGUE 06/05/2006

## 2023-09-04 NOTE — ED Notes (Signed)
 Pt is sleeping . No acute distress noted.

## 2023-09-04 NOTE — Group Note (Signed)
 Group Topic: Recovery Basics  Group Date: 09/04/2023 Start Time: 2040 End Time: 2119 Facilitators: Joan Plowman B  Department: [facility]  Number of Participants: 4  Group Focus: abuse issues and activities of daily living skills Treatment Modality:  Individual Therapy Interventions utilized were leisure development Purpose: enhance coping skills and express feelings  Name: Wesley Beck Date of Birth: May 24, 1970  MR: [id number]    Level of Participation: PT DID NOT ATTEND GROUP Quality of Participation: cooperative Interactions with others: gave feedback Mood/Affect: appropriate Triggers (if applicable): NA Cognition: coherent/clear Progress: None Response: NA Plan: patient will be encouraged to go to groups  Patients Problems:  Patient Active Problem List   Diagnosis Date Noted   MDD (major depressive disorder), recurrent episode, moderate (HCC) 09/03/2023   Schizophrenia (HCC) 09/02/2023   Elevated troponin 10/21/2021   Substance abuse (HCC) 10/21/2021   GERD (gastroesophageal reflux disease) 10/21/2021   SVT (supraventricular tachycardia) (HCC) 10/21/2021   Leukocytosis 10/21/2021   Hypoxia    Acute respiratory failure with hypoxia and hypercarbia (HCC)    Respiratory failure (HCC) 10/07/2021   Schizoaffective disorder (HCC) 04/03/2018   Cannabis abuse 04/03/2018   Suicidal behavior with attempted self-injury (HCC) 04/02/2018   Bipolar 1 disorder, mixed (HCC) 02/28/2013   DISORDER, BIPOLAR NOS 06/05/2006   TOBACCO ABUSE 06/05/2006   Allergic rhinitis 06/05/2006   Asthma 06/05/2006   SEBACEOUS CYST 06/05/2006   LOW BACK PAIN, CHRONIC 06/05/2006   MALAISE AND FATIGUE 06/05/2006

## 2023-09-04 NOTE — Group Note (Signed)
 Group Topic: Overcoming Obstacles  Group Date: 09/04/2023 Start Time: 1000 End Time: 1045 Facilitators: Alyse Leilani LABOR, NT  Department: [facility]  Number of Participants: 7  Group Focus: clarity of thought and coping skills Treatment Modality:  Patient-Centered Therapy and Skills Training Interventions utilized were group exercise, patient education, and story telling Purpose: improve communication skills and increase insight  Name: Wesley Beck Date of Birth: Sep 08, 1970  MR: [id number]    Level of Participation: active Quality of Participation: engaged Interactions with others: gave feedback Mood/Affect: appropriate Triggers (if applicable): none Cognition: insightful Progress: Moderate Response: none Plan: patient will be encouraged to attend group  Patients Problems:  Patient Active Problem List   Diagnosis Date Noted   MDD (major depressive disorder), recurrent episode, moderate (HCC) 09/03/2023   Schizophrenia (HCC) 09/02/2023   Elevated troponin 10/21/2021   Substance abuse (HCC) 10/21/2021   GERD (gastroesophageal reflux disease) 10/21/2021   SVT (supraventricular tachycardia) (HCC) 10/21/2021   Leukocytosis 10/21/2021   Hypoxia    Acute respiratory failure with hypoxia and hypercarbia (HCC)    Respiratory failure (HCC) 10/07/2021   Schizoaffective disorder (HCC) 04/03/2018   Cannabis abuse 04/03/2018   Suicidal behavior with attempted self-injury (HCC) 04/02/2018   Bipolar 1 disorder, mixed (HCC) 02/28/2013   DISORDER, BIPOLAR NOS 06/05/2006   TOBACCO ABUSE 06/05/2006   Allergic rhinitis 06/05/2006   Asthma 06/05/2006   SEBACEOUS CYST 06/05/2006   LOW BACK PAIN, CHRONIC 06/05/2006   MALAISE AND FATIGUE 06/05/2006

## 2023-09-04 NOTE — ED Provider Notes (Signed)
 Behavioral Health Progress Note  Date and Time: 09/04/2023 4:56 PM Name: Wesley Beck MRN:  [id number]  Subjective:  Wesley Beck report sleeping quite well last night and somewhat improved mood despite chronic issues. He attributes improved sleep to new medication (trazodone ) and is interested in continuing. He continues to give account of difficulty with familial relations (particularly father, stepmother). He expresses desire to never return to [facility]. Alternatively he is willing to return once he's accumulated $26k. Under that circumstance he would like to live with his son and granddaughter. Current plan is to transition to a [place] area 90d program and then pursue gainful employment. Patient denies problems with current medication, SI/HI/AVH.  Diagnosis:  Final diagnoses:  Schizoaffective disorder, unspecified type (HCC)  Cannabis dependence (HCC)   HPI: 53 year old patient with PPH bipolar disorder, schizoaffective disorder, paranoid personality and antisocial personality traits who presented to ED 09/02/23 with a chief complaint of SI.   PER ED note 09/02/23: Patient states that he is unhappy about his family not treating him well.  He then starts talking about how his son's family, his dad has family, and that they are trying to screw him.  Patient is not being treated well by them.  He states that he is taking his medications.  He was seen in the ER few days back, they recommended that patient be admitted to [facility], but patient eloped.  He states that he left because the shuttle was not in make a pit stop for patient to grab his meds.  When asked if patient had any SI currently, he states that he has had thoughts about cutting his throat.  He also states that he is an organ donor and has good liver.    Patient notes primary stressor of living outside of family house in a shed and is unhappy about his living circumstances. Reports history of numerous psychiatric admissions  including at [facility] and states he has been taking Zyprexa  5 mg at bedtime instead of BID as it is prescribed. 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  Patient perseverates that I'm not going back to [facility] and stating that he needs to make money to pay of his son's debts. 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 Patient perseverates on perceived mistreatments and states if he has to go back to [employer] just give up my organs... I'm ready to give up my organs. When asked about SI patient talks about giving up his organs but does not voice any other suicidal plan or intent. Denies HI however states If they were in front of my face I would smack them... I'm done with them. Patient denies significant depression or anxiety however reports feeling hopeless. Patient reports possible history of mania however is a vague historian. States Zyprexa  helps with the voices it slows me down. Denies difficulty with sleep, appetite or energy.   Total Time spent with patient: 30 minutes  Past Psychiatric History:   Reports multiple past psychiatric admissions and notes hx of involuntary commitment, reports he's found it helpful. Current medications: Reports he takes zyprexa  5mg  po at bedtime and then when I feel like I need it qAM. Patient was last discharged from [facility] on Zyprexa  10 mg at bedtime. He reports hx of abilify use for approximately 15 years is not sure if it was helpful - prior suicide attempts. Last attempted suicide approximately 25 years ago by overdose of pills.   Past  Medical History:   Arthritis     Asthma     Bipolar 1 disorder (HCC)     Chronic back pain     Depression     Schizophrenia, paranoid type (HCC)     Seasonal allergies    Social History: living in shed outside family's house. History of incarceration for theft.   Sleep: Good  Appetite:  Good  Current Medications:  Current Facility-Administered  Medications  Medication Dose Route Frequency Provider Last Rate Last Admin   acetaminophen  (TYLENOL ) tablet 650 mg  650 mg Oral Q6H PRN White, [name], NP       albuterol  (VENTOLIN  HFA) 108 (90 Base) MCG/ACT inhaler 1-2 puff  1-2 puff Inhalation Q4H PRN Zouev, Dmitri, MD       alum & mag hydroxide-simeth (MAALOX/MYLANTA) 200-200-20 MG/5ML suspension 30 mL  30 mL Oral Q4H PRN White, [name], NP       budesonide -glycopyrrolate -formoterol  (BREZTRI ) 160-9-4.8 MCG/ACT inhaler 2 puff  2 puff Inhalation BID Zouev, Dmitri, MD   2 puff at 09/04/23 0959   carvedilol  (COREG ) tablet 3.125 mg  3.125 mg Oral BID WC Zouev, Dmitri, MD   3.125 mg at 09/04/23 1636   haloperidol  (HALDOL ) tablet 5 mg  5 mg Oral TID PRN White, [name], NP       And   diphenhydrAMINE  (BENADRYL ) capsule 50 mg  50 mg Oral TID PRN White, [name], NP       haloperidol  lactate (HALDOL ) injection 5 mg  5 mg Intramuscular TID PRN White, [name], NP       And   diphenhydrAMINE  (BENADRYL ) injection 50 mg  50 mg Intramuscular TID PRN White, [name], NP       And   LORazepam  (ATIVAN ) injection 2 mg  2 mg Intramuscular TID PRN White, [name], NP       haloperidol  lactate (HALDOL ) injection 10 mg  10 mg Intramuscular TID PRN White, [name], NP       And   diphenhydrAMINE  (BENADRYL ) injection 50 mg  50 mg Intramuscular TID PRN White, [name], NP       And   LORazepam  (ATIVAN ) injection 2 mg  2 mg Intramuscular TID PRN White, [name], NP       hydrOXYzine  (ATARAX ) tablet 25 mg  25 mg Oral TID PRN White, [name], NP       loratadine  (CLARITIN ) tablet 10 mg  10 mg Oral Daily PRN Zouev, Dmitri, MD       magnesium  hydroxide (MILK OF MAGNESIA) suspension 30 mL  30 mL Oral Daily PRN White, [name], NP       OLANZapine  (ZYPREXA ) tablet 10 mg  10 mg Oral QHS Zouev, Dmitri, MD   10 mg at 09/03/23 2128   rosuvastatin  (CRESTOR ) tablet 10 mg  10 mg Oral Daily Zouev, Dmitri, MD   10 mg at 09/04/23 9160   traZODone  (DESYREL ) tablet 50  mg  50 mg Oral QHS PRN White, [name], NP   50 mg at 09/03/23 2128   Current Outpatient Medications  Medication Sig Dispense Refill   albuterol  (VENTOLIN  HFA) 108 (90 Base) MCG/ACT inhaler Inhale 1-2 puffs into the lungs every 4 (four) hours as needed for wheezing or shortness of breath.     atorvastatin  (LIPITOR) 20 MG tablet Take 20 mg by mouth See admin instructions. Take it on Sunday, Tuesday, Wednesday, Thursday, and Saturday as needed for high cholesterol.     carvedilol  (COREG ) 3.125  MG tablet Take 3.125 mg by mouth 2 (two) times daily with a meal.     Coenzyme Q10 (COQ10 PO) Take 1 tablet by mouth 3 (three) times a week. Monday, Wednesday, and Friday     hydrOXYzine  (ATARAX ) 25 MG tablet Take 25 mg by mouth 3 (three) times daily as needed for anxiety.     ibuprofen  (ADVIL ) 400 MG tablet Take 400 mg by mouth every 6 (six) hours as needed for mild pain (pain score 1-3).     loratadine  (CLARITIN ) 10 MG tablet Take 10 mg by mouth daily as needed for allergies.     meloxicam (MOBIC) 7.5 MG tablet Take 7.5 mg by mouth 2 (two) times daily as needed for pain.     OLANZapine  (ZYPREXA ) 5 MG tablet Take 5 mg by mouth in the morning and at bedtime.     rosuvastatin  (CRESTOR ) 10 MG tablet Take 10 mg by mouth 2 (two) times a week. Monday and Friday     thiamine (VITAMIN B1) 100 MG tablet Take 100 mg by mouth daily.     TRELEGY ELLIPTA 100-62.5-25 MCG/ACT AEPB Inhale 1 puff into the lungs daily.     zinc gluconate 50 MG tablet Take 50 mg by mouth daily.      Labs  Lab Results:  Admission on 09/02/2023, Discharged on 09/02/2023  Component Date Value Ref Range Status   Sodium 09/02/2023 138  135 - 145 mmol/L Final   Potassium 09/02/2023 3.9  3.5 - 5.1 mmol/L Final   Chloride 09/02/2023 106  98 - 111 mmol/L Final   CO2 09/02/2023 25  22 - 32 mmol/L Final   Glucose, Bld 09/02/2023 97  70 - 99 mg/dL Final   Glucose reference range applies only to samples taken after fasting for at least 8 hours.    BUN 09/02/2023 11  6 - 20 mg/dL Final   Creatinine, Ser 09/02/2023 1.01  0.61 - 1.24 mg/dL Final   Calcium  09/02/2023 9.2  8.9 - 10.3 mg/dL Final   Total Protein 92/72/7974 6.9  6.5 - 8.1 g/dL Final   Albumin 92/72/7974 4.1  3.5 - 5.0 g/dL Final   AST 92/72/7974 33  15 - 41 U/L Final   ALT 09/02/2023 26  0 - 44 U/L Final   Alkaline Phosphatase 09/02/2023 82  38 - 126 U/L Final   Total Bilirubin 09/02/2023 1.0  0.0 - 1.2 mg/dL Final   GFR, Estimated 09/02/2023 >60  >60 mL/min Final   Comment: (NOTE) Calculated using the CKD-EPI Creatinine Equation (2021)    Anion gap 09/02/2023 7  5 - 15 Final   Performed at [facility], [address] [place], [place] [address]   Alcohol, Ethyl (B) 09/02/2023 <15  <15 mg/dL Final   Comment: (NOTE) For medical purposes only. Performed at [facility], [address] [place], [place] [address]    WBC 09/02/2023 6.0  4.0 - 10.5 K/uL Final   RBC 09/02/2023 4.50  4.22 - 5.81 MIL/uL Final   Hemoglobin 09/02/2023 15.1  13.0 - 17.0 g/dL Final   HCT 92/72/7974 42.0  39.0 - 52.0 % Final   MCV 09/02/2023 93.3  80.0 - 100.0 fL Final   MCH 09/02/2023 33.6  26.0 - 34.0 pg Final   MCHC 09/02/2023 36.0  30.0 - 36.0 g/dL Final   RDW 92/72/7974 12.5  11.5 - 15.5 % Final   Platelets 09/02/2023 278  150 - 400 K/uL Final   nRBC 09/02/2023 0.0  0.0 - 0.2 %  Final   Performed at [facility], [address] [place], [place] [address]   Opiates 09/02/2023 NONE DETECTED  NONE DETECTED Final   Cocaine 09/02/2023 NONE DETECTED  NONE DETECTED Final   Benzodiazepines 09/02/2023 NONE DETECTED  NONE DETECTED Final   Amphetamines 09/02/2023 NONE DETECTED  NONE DETECTED Final   Tetrahydrocannabinol 09/02/2023 POSITIVE (A)  NONE DETECTED Final   Barbiturates 09/02/2023 NONE DETECTED  NONE DETECTED Final   Comment: (NOTE) DRUG SCREEN FOR MEDICAL PURPOSES ONLY.  IF CONFIRMATION IS NEEDED FOR ANY PURPOSE, NOTIFY LAB WITHIN 5 DAYS.  LOWEST DETECTABLE LIMITS FOR URINE DRUG  SCREEN Drug Class                     Cutoff (ng/mL) Amphetamine and metabolites    1000 Barbiturate and metabolites    200 Benzodiazepine                 200 Opiates and metabolites        300 Cocaine and metabolites        300 THC                            50 Performed at [facility], [address], [place], [place] [address]   Admission on 08/23/2023, Discharged on 08/23/2023  Component Date Value Ref Range Status   Sodium 08/23/2023 139  135 - 145 mmol/L Final   Potassium 08/23/2023 3.8  3.5 - 5.1 mmol/L Final   Chloride 08/23/2023 100  98 - 111 mmol/L Final   CO2 08/23/2023 28  22 - 32 mmol/L Final   Glucose, Bld 08/23/2023 89  70 - 99 mg/dL Final   Glucose reference range applies only to samples taken after fasting for at least 8 hours.   BUN 08/23/2023 14  6 - 20 mg/dL Final   Creatinine, Ser 08/23/2023 1.01  0.61 - 1.24 mg/dL Final   Calcium  08/23/2023 9.3  8.9 - 10.3 mg/dL Final   Total Protein 92/82/7974 6.8  6.5 - 8.1 g/dL Final   Albumin 92/82/7974 4.0  3.5 - 5.0 g/dL Final   AST 92/82/7974 34  15 - 41 U/L Final   ALT 08/23/2023 33  0 - 44 U/L Final   Alkaline Phosphatase 08/23/2023 89  38 - 126 U/L Final   Total Bilirubin 08/23/2023 0.7  0.0 - 1.2 mg/dL Final   GFR, Estimated 08/23/2023 >60  >60 mL/min Final   Comment: (NOTE) Calculated using the CKD-EPI Creatinine Equation (2021)    Anion gap 08/23/2023 11  5 - 15 Final   Performed at [facility], [address], [place], [place] [address]   Alcohol, Ethyl (B) 08/23/2023 <15  <15 mg/dL Final   Comment: (NOTE) For medical purposes only. Performed at [facility], [address] [place], [place] [address]    WBC 08/23/2023 8.4  4.0 - 10.5 K/uL Final   RBC 08/23/2023 4.89  4.22 - 5.81 MIL/uL Final   Hemoglobin 08/23/2023 16.3  13.0 - 17.0 g/dL Final   HCT 92/82/7974 47.5  39.0 - 52.0 % Final   MCV 08/23/2023 97.1  80.0 - 100.0 fL Final   MCH 08/23/2023 33.3  26.0 - 34.0 pg Final   MCHC 08/23/2023 34.3  30.0 -  36.0 g/dL Final   RDW 92/82/7974 12.8  11.5 - 15.5 % Final   Platelets 08/23/2023 302  150 - 400 K/uL Final   nRBC 08/23/2023 0.0  0.0 -  0.2 % Final   Performed at [facility], [address], [place], [place] [address]   Opiates 08/23/2023 NONE DETECTED  NONE DETECTED Final   Cocaine 08/23/2023 NONE DETECTED  NONE DETECTED Final   Benzodiazepines 08/23/2023 NONE DETECTED  NONE DETECTED Final   Amphetamines 08/23/2023 NONE DETECTED  NONE DETECTED Final   Tetrahydrocannabinol 08/23/2023 POSITIVE (A)  NONE DETECTED Final   Barbiturates 08/23/2023 NONE DETECTED  NONE DETECTED Final   Comment: (NOTE) DRUG SCREEN FOR MEDICAL PURPOSES ONLY.  IF CONFIRMATION IS NEEDED FOR ANY PURPOSE, NOTIFY LAB WITHIN 5 DAYS.  LOWEST DETECTABLE LIMITS FOR URINE DRUG SCREEN Drug Class                     Cutoff (ng/mL) Amphetamine and metabolites    1000 Barbiturate and metabolites    200 Benzodiazepine                 200 Opiates and metabolites        300 Cocaine and metabolites        300 THC                            50 Performed at [facility], [address] [place], [place] [address]   Admission on 07/14/2023, Discharged on 07/15/2023  Component Date Value Ref Range Status   Sodium 07/14/2023 138  135 - 145 mmol/L Final   Potassium 07/14/2023 3.5  3.5 - 5.1 mmol/L Final   Chloride 07/14/2023 104  98 - 111 mmol/L Final   CO2 07/14/2023 22  22 - 32 mmol/L Final   Glucose, Bld 07/14/2023 98  70 - 99 mg/dL Final   Glucose reference range applies only to samples taken after fasting for at least 8 hours.   BUN 07/14/2023 11  6 - 20 mg/dL Final   Creatinine, Ser 07/14/2023 0.95  0.61 - 1.24 mg/dL Final   Calcium  07/14/2023 9.4  8.9 - 10.3 mg/dL Final   Total Protein 93/92/7974 7.2  6.5 - 8.1 g/dL Final   Albumin 93/92/7974 4.4  3.5 - 5.0 g/dL Final   AST 93/92/7974 63 (H)  15 - 41 U/L Final   ALT 07/14/2023 49 (H)  0 - 44 U/L Final   Alkaline Phosphatase 07/14/2023 89  38 - 126 U/L Final    Total Bilirubin 07/14/2023 1.1  0.0 - 1.2 mg/dL Final   GFR, Estimated 07/14/2023 >60  >60 mL/min Final   Comment: (NOTE) Calculated using the CKD-EPI Creatinine Equation (2021)    Anion gap 07/14/2023 12  5 - 15 Final   Performed at [facility], [address], [place], [place] [address]   Alcohol, Ethyl (B) 07/14/2023 <15  <15 mg/dL Final   Comment: (NOTE) For medical purposes only. Performed at [facility], [address], [place], [place] [address]    Opiates 07/14/2023 NONE DETECTED  NONE DETECTED Final   Cocaine 07/14/2023 NONE DETECTED  NONE DETECTED Final   Benzodiazepines 07/14/2023 NONE DETECTED  NONE DETECTED Final   Amphetamines 07/14/2023 NONE DETECTED  NONE DETECTED Final   Tetrahydrocannabinol 07/14/2023 POSITIVE (A)  NONE DETECTED Final   Barbiturates 07/14/2023 NONE DETECTED  NONE DETECTED Final   Comment: (NOTE) DRUG SCREEN FOR MEDICAL PURPOSES ONLY.  IF CONFIRMATION IS NEEDED FOR ANY PURPOSE, NOTIFY LAB WITHIN 5 DAYS.  LOWEST DETECTABLE LIMITS FOR URINE DRUG SCREEN Drug Class  Cutoff (ng/mL) Amphetamine and metabolites    1000 Barbiturate and metabolites    200 Benzodiazepine                 200 Opiates and metabolites        300 Cocaine and metabolites        300 THC                            50 Performed at [facility], [address], [place], [place] [address]    WBC 07/14/2023 6.9  4.0 - 10.5 K/uL Final   RBC 07/14/2023 4.54  4.22 - 5.81 MIL/uL Final   Hemoglobin 07/14/2023 14.8  13.0 - 17.0 g/dL Final   HCT 93/92/7974 42.0  39.0 - 52.0 % Final   MCV 07/14/2023 92.5  80.0 - 100.0 fL Final   MCH 07/14/2023 32.6  26.0 - 34.0 pg Final   MCHC 07/14/2023 35.2  30.0 - 36.0 g/dL Final   RDW 93/92/7974 12.5  11.5 - 15.5 % Final   Platelets 07/14/2023 314  150 - 400 K/uL Final   nRBC 07/14/2023 0.0  0.0 - 0.2 % Final   Neutrophils Relative % 07/14/2023 54  % Final   Neutro Abs 07/14/2023 3.8  1.7 - 7.7 K/uL Final   Lymphocytes Relative  07/14/2023 23  % Final   Lymphs Abs 07/14/2023 1.6  0.7 - 4.0 K/uL Final   Monocytes Relative 07/14/2023 11  % Final   Monocytes Absolute 07/14/2023 0.8  0.1 - 1.0 K/uL Final   Eosinophils Relative 07/14/2023 10  % Final   Eosinophils Absolute 07/14/2023 0.7 (H)  0.0 - 0.5 K/uL Final   Basophils Relative 07/14/2023 2  % Final   Basophils Absolute 07/14/2023 0.1  0.0 - 0.1 K/uL Final   Immature Granulocytes 07/14/2023 0  % Final   Abs Immature Granulocytes 07/14/2023 0.02  0.00 - 0.07 K/uL Final   Performed at [facility], [address], [place], [place] [address]   Salicylate Lvl 07/14/2023 <7.0 (L)  7.0 - 30.0 mg/dL Final   Performed at [facility], [address] [place], [place] [address]   Acetaminophen  (Tylenol ), Serum 07/14/2023 <10 (L)  10 - 30 ug/mL Final   Comment: (NOTE) Therapeutic concentrations vary significantly. A range of 10-30 ug/mL  may be an effective concentration for many patients. However, some  are best treated at concentrations outside of this range. Acetaminophen  concentrations >150 ug/mL at 4 hours after ingestion  and >50 ug/mL at 12 hours after ingestion are often associated with  toxic reactions.  Performed at [facility], [address] [place], [place] [address]     Blood Alcohol level:  Lab Results  Component Value Date   [facility] <15 09/02/2023   [facility] <15 08/23/2023    Metabolic Disorder Labs: Lab Results  Component Value Date   HGBA1C 4.9 10/08/2021   MPG 93.93 10/08/2021   No results found for: PROLACTIN Lab Results  Component Value Date   CHOL 114 05/08/2022   TRIG 55 05/08/2022   HDL 40 (L) 05/08/2022   CHOLHDL 2.9 05/08/2022   VLDL 11 05/08/2022   LDLCALC 63 05/08/2022   LDLCALC 112 (H) 05/03/2020    Therapeutic Lab Levels: No results found for: LITHIUM No results found for: VALPROATE No results found for: CBMZ  Physical Findings   AUDIT    Flowsheet Row Admission (Discharged) from 04/02/2018 in [facility] INPAT[facility]  BEHAVIORAL MEDICINE [facility]  Alcohol Use Disorder Identification Test Final Score (AUDIT) 3 0   Flowsheet Row ED from 09/02/2023 in [facility] Most recent reading at 09/03/2023 10:55 PM ED from 09/02/2023 in [facility] Emergency Department at [facility] Most recent reading at 09/02/2023 11:54 AM ED from 08/23/2023 in [facility] Emergency Department at [facility] Most recent reading at 08/23/2023  1:12 AM  C-SSRS RISK CATEGORY No Risk High Risk High Risk     Musculoskeletal  Strength & Muscle Tone: within normal limits Gait & Station: normal Patient leans: N/A  Psychiatric Specialty Exam  Presentation  General Appearance:  Disheveled  Eye Contact: Fair  Speech: Clear and Coherent  Speech Volume: Normal  Handedness: Right   Mood and Affect  Mood: Anxious (pleasant but irritated by family issues)  Affect: Congruent   Thought Process  Thought Processes: Other (comment) (coherent albeit idiosyncratic description of life, family, circumstances)  Descriptions of Associations:Circumstantial  Orientation:Full (Time, Place and Person)  Thought Content:Perseveration; Scattered  Diagnosis of Schizophrenia or Schizoaffective disorder in past: Yes  Duration of Psychotic Symptoms: Less than six months   Hallucinations:Hallucinations: Auditory  Ideas of Reference:Percusatory  Suicidal Thoughts:Suicidal Thoughts: Yes, Passive  Homicidal Thoughts:Homicidal Thoughts: No   Sensorium  Memory: Immediate Fair; Recent Fair; Remote Fair  Judgment: Fair  Insight: Fair  Art therapist  Concentration: Fair  Attention Span: Fair  Recall: [employer] of Knowledge: Fair  Language: Fair  Psychomotor Activity  Psychomotor Activity: Psychomotor Activity: Psychomotor Retardation  Assets  Assets: Communication Skills; Desire for  Improvement; Resilience  Sleep  Sleep: Sleep: Good  Estimated Sleeping Duration (Last 24 Hours): 5.75-6.50 hours  No data recorded  Physical Exam  Physical Exam ROS Blood pressure 128/81, pulse 66, temperature 98.4 F (36.9 C), temperature source Oral, resp. rate 20, SpO2 95%. There is no height or weight on file to calculate BMI.  Treatment Plan Summary: Daily contact with patient to assess and evaluate symptoms and progress in treatment. Maintain current medications for psychiatric and somatic health issues.  KANDI JAYSON HAHN, MD 09/04/2023 4:56 PM

## 2023-09-05 DIAGNOSIS — F259 Schizoaffective disorder, unspecified: Secondary | ICD-10-CM | POA: Diagnosis not present

## 2023-09-05 DIAGNOSIS — Z5901 Sheltered homelessness: Secondary | ICD-10-CM | POA: Diagnosis not present

## 2023-09-05 DIAGNOSIS — Z79899 Other long term (current) drug therapy: Secondary | ICD-10-CM | POA: Diagnosis not present

## 2023-09-05 NOTE — ED Notes (Signed)
 Pt is in the dayroom watching TV with peers. Pt denies SI/HI/AVH. Pt has no further complain.No acute distress noted.

## 2023-09-05 NOTE — ED Notes (Signed)
 Pt has he has 'high hopes' and denies si hi and avh- verbal contract for safety provided. Pt ate breakfast, denies physical pain and discomfort.

## 2023-09-05 NOTE — ED Notes (Signed)
 Wesley Beck reported he is better now. Denied SI/HI. No audiovisual hallucination. No sleep disturbances reported. Hygiene maintained. Independent for activity of daily living. Speech clear Med complaint No case presented.

## 2023-09-05 NOTE — ED Notes (Signed)
 Pt having snack in dayroom, no complaints or concerns reported thus far.

## 2023-09-05 NOTE — ED Notes (Signed)
 Pts clothes washed and gave back to pt

## 2023-09-05 NOTE — Group Note (Signed)
 Group Topic: Social Support  Group Date: 09/05/2023 Start Time: 1700 End Time: 1715 Facilitators: Celinda Suzen NOVAK, NT  Department: [facility]  Number of Participants: 7  Group Focus: check in Treatment Modality:  Psychoeducation Interventions utilized were support Purpose: increase insight  Name: Wesley Beck Date of Birth: 1970-03-02  MR: [id number]    Level of Participation: active Quality of Participation: cooperative Interactions with others: gave feedback Mood/Affect: appropriate Triggers (if applicable): n/a Cognition: coherent/clear Progress: Other Response: PT shared the difference between spiritual christianity and pagan christianity. Plan: patient will be encouraged to attend group  Patients Problems:  Patient Active Problem List   Diagnosis Date Noted   MDD (major depressive disorder), recurrent episode, moderate (HCC) 09/03/2023   Schizophrenia (HCC) 09/02/2023   Elevated troponin 10/21/2021   Substance abuse (HCC) 10/21/2021   GERD (gastroesophageal reflux disease) 10/21/2021   SVT (supraventricular tachycardia) (HCC) 10/21/2021   Leukocytosis 10/21/2021   Hypoxia    Acute respiratory failure with hypoxia and hypercarbia (HCC)    Respiratory failure (HCC) 10/07/2021   Schizoaffective disorder (HCC) 04/03/2018   Cannabis abuse 04/03/2018   Suicidal behavior with attempted self-injury (HCC) 04/02/2018   Bipolar 1 disorder, mixed (HCC) 02/28/2013   DISORDER, BIPOLAR NOS 06/05/2006   TOBACCO ABUSE 06/05/2006   Allergic rhinitis 06/05/2006   Asthma 06/05/2006   SEBACEOUS CYST 06/05/2006   LOW BACK PAIN, CHRONIC 06/05/2006   MALAISE AND FATIGUE 06/05/2006

## 2023-09-05 NOTE — Discharge Planning (Signed)
 Patient made SW aware he wanted to go to [facility] upon DC and does not care to return to [place]. SW provided [employer] resources and referred to [facility] for his follow up with ongoing mental health services and medication management. SW cancelled his follow up appointment for [place].  Instructions are provided in detail in patients discharge AVS. A cab voucher will be provided to patient for DC to [facility] @ [address] [place] for pick up at 12:30 to arrive at the shelter at 1:00. 7 days of medications will be provided as well as scripts for 30 days of medications.

## 2023-09-05 NOTE — ED Notes (Signed)
 Patient is sleeping nothing strange noted

## 2023-09-05 NOTE — ED Notes (Signed)
 Patient still slepping

## 2023-09-05 NOTE — Group Note (Signed)
 Group Topic: Relapse and Recovery  Group Date: 09/05/2023 Start Time: 2000 End Time: 2100 Facilitators: Joan Plowman B  Department: [facility]  Number of Participants: 6  Group Focus: abuse issues, activities of daily living skills, chemical dependency education, chemical dependency issues, communication, community group, coping skills, daily focus, and relapse prevention Treatment Modality:  Leisure Counsellor, Patient-Centered Therapy, and Spiritual Interventions utilized were leisure development, story telling, and support Purpose: enhance coping skills, express feelings, increase insight, regain self-worth, relapse prevention strategies, and trigger / craving management  Name: Wesley Beck Date of Birth: 18-Jun-1970  MR: [id number]    Level of Participation: active Quality of Participation: attentive and cooperative Interactions with others: gave feedback Mood/Affect: appropriate Triggers (if applicable): NA Cognition: coherent/clear Progress: Gaining insight Response: NA Plan: patient will be encouraged to keep going to groups.   Patients Problems:  Patient Active Problem List   Diagnosis Date Noted   MDD (major depressive disorder), recurrent episode, moderate (HCC) 09/03/2023   Schizophrenia (HCC) 09/02/2023   Elevated troponin 10/21/2021   Substance abuse (HCC) 10/21/2021   GERD (gastroesophageal reflux disease) 10/21/2021   SVT (supraventricular tachycardia) (HCC) 10/21/2021   Leukocytosis 10/21/2021   Hypoxia    Acute respiratory failure with hypoxia and hypercarbia (HCC)    Respiratory failure (HCC) 10/07/2021   Schizoaffective disorder (HCC) 04/03/2018   Cannabis abuse 04/03/2018   Suicidal behavior with attempted self-injury (HCC) 04/02/2018   Bipolar 1 disorder, mixed (HCC) 02/28/2013   DISORDER, BIPOLAR NOS 06/05/2006   TOBACCO ABUSE 06/05/2006   Allergic rhinitis 06/05/2006   Asthma 06/05/2006   SEBACEOUS CYST 06/05/2006    LOW BACK PAIN, CHRONIC 06/05/2006   MALAISE AND FATIGUE 06/05/2006

## 2023-09-05 NOTE — ED Provider Notes (Signed)
 Behavioral Health Progress Note  Date and Time: 09/05/2023 11:15 AM Name: Wesley Beck MRN:  [id number]  Subjective:  Wesley Beck reports fair sleep and appetite. Denies depression or anxiety. Denies SI, HI or AVH today. Remains anxious about Wesley living situation and perseverates on strained family relationships. I emphasized again to him it is unrealistic to expect Wesley family to change in any way.  He continues to give account of difficulty with familial relations (particularly father, stepmother). Denies paranoia to them today. Denies feeling unsafe there however states he can no longer live in a shed.  Patient is future oriented on finding a job. Under that circumstance he would like to live with Wesley Beck and Wesley Beck. Current plan is to transition to a [place] area 90d program and then pursue gainful employment.   Diagnosis:  Final diagnoses:  Schizoaffective disorder, unspecified type (HCC)  Cannabis dependence (HCC)   HPI: 53 year old patient with PPH bipolar disorder, schizoaffective disorder, paranoid personality and antisocial personality traits who presented to ED 09/02/23 with a chief complaint of SI.   PER ED note 09/02/23: Patient states that he is unhappy about Wesley family not treating him well.  He then starts talking about how Wesley Beck's family, Wesley dad has family, and that they are trying to screw him.  Patient is not being treated well by them.  He states that he is taking Wesley medications.  He was seen in the ER few days back, they recommended that patient be admitted to [facility], but patient eloped.  He states that he left because the shuttle was not in make a pit stop for patient to grab Wesley meds.  When asked if patient had any SI currently, he states that he has had thoughts about cutting Wesley throat.  He also states that he is an organ donor and has good liver.    Patient notes primary stressor of living outside of family house in a shed and is unhappy about Wesley living  circumstances. Reports history of numerous psychiatric admissions including at [facility] and states he has been taking Zyprexa  5 mg at bedtime instead of BID as it is prescribed. 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  Patient perseverates that I'm not going back to [facility] and stating that he needs to make money to pay of Wesley Beck's debts. 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 Patient perseverates on perceived mistreatments and states if he has to go back to [employer] just give up my organs... I'm ready to give up my organs. When asked about SI patient talks about giving up Wesley organs but does not voice any other suicidal plan or intent. Denies HI however states If they were in front of my face I would smack them... I'm done with them. Patient denies significant depression or anxiety however reports feeling hopeless. Patient reports possible history of mania however is a vague historian. States Zyprexa  helps with the voices it slows me down. Denies difficulty with sleep, appetite or energy.   Total Time spent with patient: 30 minutes  Past Psychiatric History:   Reports multiple past psychiatric admissions and notes hx of involuntary commitment, reports he's found it helpful. Current medications: Reports he takes zyprexa  5mg  po at bedtime and then when I feel like I need it qAM. Patient was last discharged from [facility] on Zyprexa  10 mg at bedtime. He reports hx of abilify use for approximately 15 years is not sure  if it was helpful - prior suicide attempts. Last attempted suicide approximately 25 years ago by overdose of pills.   Past Medical History:   Arthritis     Asthma     Bipolar 1 disorder (HCC)     Chronic back pain     Depression     Schizophrenia, paranoid type (HCC)     Seasonal allergies    Social History: living in shed outside family's house. History of incarceration for theft.   Sleep: Good  Appetite:   Good  Current Medications:  Current Facility-Administered Medications  Medication Dose Route Frequency Provider Last Rate Last Admin   acetaminophen  (TYLENOL ) tablet 650 mg  650 mg Oral Q6H PRN [name], [name], NP       albuterol  (VENTOLIN  HFA) 108 (90 Base) MCG/ACT inhaler 1-2 puff  1-2 puff Inhalation Q4H PRN [name], MD       alum & mag hydroxide-simeth (MAALOX/MYLANTA) 200-200-20 MG/5ML suspension 30 mL  30 mL Oral Q4H PRN [name], [name], NP       budesonide -glycopyrrolate -formoterol  (BREZTRI ) 160-9-4.8 MCG/ACT inhaler 2 puff  2 puff Inhalation BID [name], MD   2 puff at 09/05/23 0943   carvedilol  (COREG ) tablet 3.125 mg  3.125 mg Oral BID WC [name], MD   3.125 mg at 09/05/23 0945   haloperidol  (HALDOL ) tablet 5 mg  5 mg Oral TID PRN [name], [name], NP       And   diphenhydrAMINE  (BENADRYL ) capsule 50 mg  50 mg Oral TID PRN [name], [name], NP       haloperidol  lactate (HALDOL ) injection 5 mg  5 mg Intramuscular TID PRN [name], [name], NP       And   diphenhydrAMINE  (BENADRYL ) injection 50 mg  50 mg Intramuscular TID PRN [name], [name], NP       And   LORazepam  (ATIVAN ) injection 2 mg  2 mg Intramuscular TID PRN [name], [name], NP       haloperidol  lactate (HALDOL ) injection 10 mg  10 mg Intramuscular TID PRN [name], [name], NP       And   diphenhydrAMINE  (BENADRYL ) injection 50 mg  50 mg Intramuscular TID PRN [name], [name], NP       And   LORazepam  (ATIVAN ) injection 2 mg  2 mg Intramuscular TID PRN [name], [name], NP       hydrOXYzine  (ATARAX ) tablet 25 mg  25 mg Oral TID PRN [name], [name], NP       loratadine  (CLARITIN ) tablet 10 mg  10 mg Oral Daily PRN [name], MD       magnesium  hydroxide (MILK OF MAGNESIA) suspension 30 mL  30 mL Oral Daily PRN [name], [name], NP       OLANZapine  (ZYPREXA ) tablet 10 mg  10 mg Oral QHS [name], MD   10 mg at 09/04/23 2133   rosuvastatin  (CRESTOR ) tablet 10 mg  10 mg Oral Daily [name],  MD   10 mg at 09/05/23 0944   traZODone  (DESYREL ) tablet 50 mg  50 mg Oral QHS PRN [name], [name], NP   50 mg at 09/03/23 2128   Current Outpatient Medications  Medication Sig Dispense Refill   albuterol  (VENTOLIN  HFA) 108 (90 Base) MCG/ACT inhaler Inhale 1-2 puffs into the lungs every 4 (four) hours as needed for wheezing or shortness of breath.     atorvastatin  (LIPITOR) 20 MG tablet Take 20 mg by mouth See admin  instructions. Take it on Sunday, Tuesday, Wednesday, Thursday, and Saturday as needed for high cholesterol.     carvedilol  (COREG ) 3.125 MG tablet Take 3.125 mg by mouth 2 (two) times daily with a meal.     Coenzyme Q10 (COQ10 PO) Take 1 tablet by mouth 3 (three) times a week. Monday, Wednesday, and Friday     hydrOXYzine  (ATARAX ) 25 MG tablet Take 25 mg by mouth 3 (three) times daily as needed for anxiety.     ibuprofen  (ADVIL ) 400 MG tablet Take 400 mg by mouth every 6 (six) hours as needed for mild pain (pain score 1-3).     loratadine  (CLARITIN ) 10 MG tablet Take 10 mg by mouth daily as needed for allergies.     meloxicam (MOBIC) 7.5 MG tablet Take 7.5 mg by mouth 2 (two) times daily as needed for pain.     OLANZapine  (ZYPREXA ) 5 MG tablet Take 5 mg by mouth in the morning and at bedtime.     rosuvastatin  (CRESTOR ) 10 MG tablet Take 10 mg by mouth 2 (two) times a week. Monday and Friday     thiamine (VITAMIN B1) 100 MG tablet Take 100 mg by mouth daily.     TRELEGY ELLIPTA 100-62.5-25 MCG/ACT AEPB Inhale 1 puff into the lungs daily.     zinc gluconate 50 MG tablet Take 50 mg by mouth daily.      Labs  Lab Results:  Admission on 09/02/2023, Discharged on 09/02/2023  Component Date Value Ref Range Status   Sodium 09/02/2023 138  135 - 145 mmol/L Final   Potassium 09/02/2023 3.9  3.5 - 5.1 mmol/L Final   Chloride 09/02/2023 106  98 - 111 mmol/L Final   CO2 09/02/2023 25  22 - 32 mmol/L Final   Glucose, Bld 09/02/2023 97  70 - 99 mg/dL Final   Glucose reference range applies  only to samples taken after fasting for at least 8 hours.   BUN 09/02/2023 11  6 - 20 mg/dL Final   Creatinine, Ser 09/02/2023 1.01  0.61 - 1.24 mg/dL Final   Calcium  09/02/2023 9.2  8.9 - 10.3 mg/dL Final   Total Protein 92/72/7974 6.9  6.5 - 8.1 g/dL Final   Albumin 92/72/7974 4.1  3.5 - 5.0 g/dL Final   AST 92/72/7974 33  15 - 41 U/L Final   ALT 09/02/2023 26  0 - 44 U/L Final   Alkaline Phosphatase 09/02/2023 82  38 - 126 U/L Final   Total Bilirubin 09/02/2023 1.0  0.0 - 1.2 mg/dL Final   GFR, Estimated 09/02/2023 >60  >60 mL/min Final   Comment: (NOTE) Calculated using the CKD-EPI Creatinine Equation (2021)    Anion gap 09/02/2023 7  5 - 15 Final   Performed at [facility], [address] [place], [place] [address]   Alcohol, Ethyl (B) 09/02/2023 <15  <15 mg/dL Final   Comment: (NOTE) For medical purposes only. Performed at [facility], [address], [place], [place] [address]    WBC 09/02/2023 6.0  4.0 - 10.5 K/uL Final   RBC 09/02/2023 4.50  4.22 - 5.81 MIL/uL Final   Hemoglobin 09/02/2023 15.1  13.0 - 17.0 g/dL Final   HCT 92/72/7974 42.0  39.0 - 52.0 % Final   MCV 09/02/2023 93.3  80.0 - 100.0 fL Final   MCH 09/02/2023 33.6  26.0 - 34.0 pg Final   MCHC 09/02/2023 36.0  30.0 - 36.0 g/dL Final   RDW 92/72/7974 12.5  11.5 - 15.5 %  Final   Platelets 09/02/2023 278  150 - 400 K/uL Final   nRBC 09/02/2023 0.0  0.0 - 0.2 % Final   Performed at [facility], [name], [place], [place] [address]   Opiates 09/02/2023 NONE DETECTED  NONE DETECTED Final   Cocaine 09/02/2023 NONE DETECTED  NONE DETECTED Final   Benzodiazepines 09/02/2023 NONE DETECTED  NONE DETECTED Final   Amphetamines 09/02/2023 NONE DETECTED  NONE DETECTED Final   Tetrahydrocannabinol 09/02/2023 POSITIVE (A)  NONE DETECTED Final   Barbiturates 09/02/2023 NONE DETECTED  NONE DETECTED Final   Comment: (NOTE) DRUG SCREEN FOR MEDICAL PURPOSES ONLY.  IF CONFIRMATION IS NEEDED FOR ANY PURPOSE, NOTIFY  LAB WITHIN 5 DAYS.  LOWEST DETECTABLE LIMITS FOR URINE DRUG SCREEN Drug Class                     Cutoff (ng/mL) Amphetamine and metabolites    1000 Barbiturate and metabolites    200 Benzodiazepine                 200 Opiates and metabolites        300 Cocaine and metabolites        300 THC                            50 Performed at [facility], [address], [place], [place] [address]   Admission on 08/23/2023, Discharged on 08/23/2023  Component Date Value Ref Range Status   Sodium 08/23/2023 139  135 - 145 mmol/L Final   Potassium 08/23/2023 3.8  3.5 - 5.1 mmol/L Final   Chloride 08/23/2023 100  98 - 111 mmol/L Final   CO2 08/23/2023 28  22 - 32 mmol/L Final   Glucose, Bld 08/23/2023 89  70 - 99 mg/dL Final   Glucose reference range applies only to samples taken after fasting for at least 8 hours.   BUN 08/23/2023 14  6 - 20 mg/dL Final   Creatinine, Ser 08/23/2023 1.01  0.61 - 1.24 mg/dL Final   Calcium  08/23/2023 9.3  8.9 - 10.3 mg/dL Final   Total Protein 92/82/7974 6.8  6.5 - 8.1 g/dL Final   Albumin 92/82/7974 4.0  3.5 - 5.0 g/dL Final   AST 92/82/7974 34  15 - 41 U/L Final   ALT 08/23/2023 33  0 - 44 U/L Final   Alkaline Phosphatase 08/23/2023 89  38 - 126 U/L Final   Total Bilirubin 08/23/2023 0.7  0.0 - 1.2 mg/dL Final   GFR, Estimated 08/23/2023 >60  >60 mL/min Final   Comment: (NOTE) Calculated using the CKD-EPI Creatinine Equation (2021)    Anion gap 08/23/2023 11  5 - 15 Final   Performed at [facility], [address] [place], [place] [address]   Alcohol, Ethyl (B) 08/23/2023 <15  <15 mg/dL Final   Comment: (NOTE) For medical purposes only. Performed at [facility], [address] [place], [place] [address]    WBC 08/23/2023 8.4  4.0 - 10.5 K/uL Final   RBC 08/23/2023 4.89  4.22 - 5.81 MIL/uL Final   Hemoglobin 08/23/2023 16.3  13.0 - 17.0 g/dL Final   HCT 92/82/7974 47.5  39.0 - 52.0 % Final   MCV 08/23/2023 97.1  80.0 - 100.0 fL Final   MCH  08/23/2023 33.3  26.0 - 34.0 pg Final   MCHC 08/23/2023 34.3  30.0 - 36.0 g/dL Final   RDW 92/82/7974 12.8  11.5 -  15.5 % Final   Platelets 08/23/2023 302  150 - 400 K/uL Final   nRBC 08/23/2023 0.0  0.0 - 0.2 % Final   Performed at [facility], [address] [place], [place] [address]   Opiates 08/23/2023 NONE DETECTED  NONE DETECTED Final   Cocaine 08/23/2023 NONE DETECTED  NONE DETECTED Final   Benzodiazepines 08/23/2023 NONE DETECTED  NONE DETECTED Final   Amphetamines 08/23/2023 NONE DETECTED  NONE DETECTED Final   Tetrahydrocannabinol 08/23/2023 POSITIVE (A)  NONE DETECTED Final   Barbiturates 08/23/2023 NONE DETECTED  NONE DETECTED Final   Comment: (NOTE) DRUG SCREEN FOR MEDICAL PURPOSES ONLY.  IF CONFIRMATION IS NEEDED FOR ANY PURPOSE, NOTIFY LAB WITHIN 5 DAYS.  LOWEST DETECTABLE LIMITS FOR URINE DRUG SCREEN Drug Class                     Cutoff (ng/mL) Amphetamine and metabolites    1000 Barbiturate and metabolites    200 Benzodiazepine                 200 Opiates and metabolites        300 Cocaine and metabolites        300 THC                            50 Performed at [facility], [address], [place], [place] [address]   Admission on 07/14/2023, Discharged on 07/15/2023  Component Date Value Ref Range Status   Sodium 07/14/2023 138  135 - 145 mmol/L Final   Potassium 07/14/2023 3.5  3.5 - 5.1 mmol/L Final   Chloride 07/14/2023 104  98 - 111 mmol/L Final   CO2 07/14/2023 22  22 - 32 mmol/L Final   Glucose, Bld 07/14/2023 98  70 - 99 mg/dL Final   Glucose reference range applies only to samples taken after fasting for at least 8 hours.   BUN 07/14/2023 11  6 - 20 mg/dL Final   Creatinine, Ser 07/14/2023 0.95  0.61 - 1.24 mg/dL Final   Calcium  07/14/2023 9.4  8.9 - 10.3 mg/dL Final   Total Protein 93/92/7974 7.2  6.5 - 8.1 g/dL Final   Albumin 93/92/7974 4.4  3.5 - 5.0 g/dL Final   AST 93/92/7974 63 (H)  15 - 41 U/L Final   ALT 07/14/2023 49 (H)  0 - 44 U/L  Final   Alkaline Phosphatase 07/14/2023 89  38 - 126 U/L Final   Total Bilirubin 07/14/2023 1.1  0.0 - 1.2 mg/dL Final   GFR, Estimated 07/14/2023 >60  >60 mL/min Final   Comment: (NOTE) Calculated using the CKD-EPI Creatinine Equation (2021)    Anion gap 07/14/2023 12  5 - 15 Final   Performed at [facility], [address] [place], [place] [address]   Alcohol, Ethyl (B) 07/14/2023 <15  <15 mg/dL Final   Comment: (NOTE) For medical purposes only. Performed at [facility], [address], [place], [place] [address]    Opiates 07/14/2023 NONE DETECTED  NONE DETECTED Final   Cocaine 07/14/2023 NONE DETECTED  NONE DETECTED Final   Benzodiazepines 07/14/2023 NONE DETECTED  NONE DETECTED Final   Amphetamines 07/14/2023 NONE DETECTED  NONE DETECTED Final   Tetrahydrocannabinol 07/14/2023 POSITIVE (A)  NONE DETECTED Final   Barbiturates 07/14/2023 NONE DETECTED  NONE DETECTED Final   Comment: (NOTE) DRUG SCREEN FOR MEDICAL PURPOSES ONLY.  IF CONFIRMATION IS NEEDED FOR ANY PURPOSE, NOTIFY LAB WITHIN 5  DAYS.  LOWEST DETECTABLE LIMITS FOR URINE DRUG SCREEN Drug Class                     Cutoff (ng/mL) Amphetamine and metabolites    1000 Barbiturate and metabolites    200 Benzodiazepine                 200 Opiates and metabolites        300 Cocaine and metabolites        300 THC                            50 Performed at [facility], [address] [place], [place] [address]    WBC 07/14/2023 6.9  4.0 - 10.5 K/uL Final   RBC 07/14/2023 4.54  4.22 - 5.81 MIL/uL Final   Hemoglobin 07/14/2023 14.8  13.0 - 17.0 g/dL Final   HCT 93/92/7974 42.0  39.0 - 52.0 % Final   MCV 07/14/2023 92.5  80.0 - 100.0 fL Final   MCH 07/14/2023 32.6  26.0 - 34.0 pg Final   MCHC 07/14/2023 35.2  30.0 - 36.0 g/dL Final   RDW 93/92/7974 12.5  11.5 - 15.5 % Final   Platelets 07/14/2023 314  150 - 400 K/uL Final   nRBC 07/14/2023 0.0  0.0 - 0.2 % Final   Neutrophils Relative % 07/14/2023 54  % Final    Neutro Abs 07/14/2023 3.8  1.7 - 7.7 K/uL Final   Lymphocytes Relative 07/14/2023 23  % Final   Lymphs Abs 07/14/2023 1.6  0.7 - 4.0 K/uL Final   Monocytes Relative 07/14/2023 11  % Final   Monocytes Absolute 07/14/2023 0.8  0.1 - 1.0 K/uL Final   Eosinophils Relative 07/14/2023 10  % Final   Eosinophils Absolute 07/14/2023 0.7 (H)  0.0 - 0.5 K/uL Final   Basophils Relative 07/14/2023 2  % Final   Basophils Absolute 07/14/2023 0.1  0.0 - 0.1 K/uL Final   Immature Granulocytes 07/14/2023 0  % Final   Abs Immature Granulocytes 07/14/2023 0.02  0.00 - 0.07 K/uL Final   Performed at [facility], [address], [place], [place] [address]   Salicylate Lvl 07/14/2023 <7.0 (L)  7.0 - 30.0 mg/dL Final   Performed at [facility], [address] [place], [place] [address]   Acetaminophen  (Tylenol ), Serum 07/14/2023 <10 (L)  10 - 30 ug/mL Final   Comment: (NOTE) Therapeutic concentrations vary significantly. A range of 10-30 ug/mL  may be an effective concentration for many patients. However, some  are best treated at concentrations outside of this range. Acetaminophen  concentrations >150 ug/mL at 4 hours after ingestion  and >50 ug/mL at 12 hours after ingestion are often associated with  toxic reactions.  Performed at [facility], [address] [place], [place] [address]     Blood Alcohol level:  Lab Results  Component Value Date   [facility] <15 09/02/2023   [facility] <15 08/23/2023    Metabolic Disorder Labs: Lab Results  Component Value Date   HGBA1C 4.9 10/08/2021   MPG 93.93 10/08/2021   No results found for: PROLACTIN Lab Results  Component Value Date   CHOL 114 05/08/2022   TRIG 55 05/08/2022   HDL 40 (L) 05/08/2022   CHOLHDL 2.9 05/08/2022   VLDL 11 05/08/2022   LDLCALC 63 05/08/2022   LDLCALC 112 (H) 05/03/2020    Therapeutic Lab Levels: No results found for: LITHIUM No  results found for: VALPROATE No results found for: CBMZ  Physical Findings   AUDIT     Flowsheet Row Admission (Discharged) from 04/02/2018 in [facility]  Alcohol Use Disorder Identification Test Final Score (AUDIT) 3 0   Flowsheet Row ED from 09/02/2023 in [facility] Most recent reading at 09/03/2023 10:55 PM ED from 09/02/2023 in [facility] Emergency Department at [facility] Most recent reading at 09/02/2023 11:54 AM ED from 08/23/2023 in [facility] Emergency Department at [facility] Most recent reading at 08/23/2023  1:12 AM  C-SSRS RISK CATEGORY No Risk High Risk High Risk     Musculoskeletal  Strength & Muscle Tone: within normal limits Gait & Station: normal Patient leans: N/A  Psychiatric Specialty Exam  Presentation  General Appearance:  Disheveled  Eye Contact: Fair  Speech: Clear and Coherent  Speech Volume: Normal  Handedness: Right   Mood and Affect  Mood: Anxious (pleasant but irritated by family issues)  Affect: Congruent   Thought Process  Thought Processes: Other (comment) (coherent albeit idiosyncratic description of life, family, circumstances)  Descriptions of Associations:Circumstantial  Orientation:Full (Time, Place and Person)  Thought Content:Perseveration; Scattered  Diagnosis of Schizophrenia or Schizoaffective disorder in past: Yes  Duration of Psychotic Symptoms: Less than six months   Hallucinations:denies AH  Ideas of Reference:denies  Suicidal Thoughts: denies SI today  Homicidal Thoughts:Homicidal Thoughts: No   Sensorium  Memory: Immediate Fair; Recent Fair; Remote Fair  Judgment: Fair  Insight: Fair  Art therapist  Concentration: Fair  Attention Span: Fair  Recall: [employer] of Knowledge: Fair  Language: Fair  Psychomotor Activity  Psychomotor Activity: Psychomotor Activity: Psychomotor Retardation  Assets   Assets: Communication Skills; Desire for Improvement; Resilience  Sleep  Sleep: Sleep: Good  Estimated Sleeping Duration (Last 24 Hours): 8.75-11.00 hours  No data recorded  Physical Exam   General: Well developed, well nourished.  Pupils: Normal at 3mm Respiratory: Breathing is unlabored.  Cardiovascular: No edema.  Language: No anomia, no aphasia Muscle strength and tone-pt moving all extremities.  Gait not assessed as pt remained in bed.  Neuro: Facial muscles are symmetric. Pt without tremor, no evidence of hyperarousal.  Review of Systems  Constitutional: Negative.   HENT: Negative.    Eyes: Negative.   Respiratory: Negative.    Cardiovascular: Negative.   Gastrointestinal: Negative.   Genitourinary: Negative.   Musculoskeletal: Negative.   Skin: Negative.   Neurological: Negative.   Endo/Heme/Allergies: Negative.   Psychiatric/Behavioral:  The patient is nervous/anxious.    Blood pressure (!) 158/93, pulse 60, temperature 98.5 F (36.9 C), temperature source Oral, resp. rate 17, SpO2 96%. There is no height or weight on file to calculate BMI.  Treatment Plan Summary: Daily contact with patient to assess and evaluate symptoms and progress in treatment. 53 year old patient with PPH bipolar disorder, schizoaffective disorder, paranoid personality and antisocial personality traits who presented to ED yesterday with a chief complaint of SI.Patient admits to cannabis abuse and admits to it making him more paranoid. States he would like to get sober. Agreeable to taking Zyprexa  and does not wish to switch to alternative antipsychotic. Voices vague SI without a plan but with thoughts of giving up my organs if I have to go to [employer]. Has history of 1 suicide attempts via OD. Patient with similar presentations and antisocial and paranoid personality traits on multiple prior presentations. Denies HI  today.  7/30: on followup patient reports improving mood and denies  SI, HI or AVH. No longer voicing paranoid ideations to me but does not wish to go back to live in a shed. Requesting inpatient rehab versus sober living   Schizoaffective disorder versus paranoid personality disorder -cont Zyprexa   10 mg at bedtime Cont trazodone  50 mg at bedtime-PRN   Cannabis dependence, rule out cannabis induced psychosis   Medical -restarted home medications including rosuvastatin  and Coreg  as well as albuterol  inhale  Hargis Vandyne, MD 09/05/2023 11:15 AM

## 2023-09-05 NOTE — ED Notes (Signed)
 Patient is sleeping. Respirations equal and unlabored. No change in assessment or acuity. Routine safety checks conducted according to facility protocol.

## 2023-09-05 NOTE — Group Note (Signed)
 Group Topic: Identity and Relationships  Group Date: 09/05/2023 Start Time: 1730 End Time: 1830 Facilitators: Daved Tinnie HERO, RN  Department: [facility]  Number of Participants: 4  Group Focus: nursing group Treatment Modality:  Psychoeducation Interventions utilized were exploration, group exercise, and patient education Purpose: improve communication skills and increase insight  Name: Wesley Beck Date of Birth: 1970-10-10  MR: [id number]    Level of Participation: Level of Participation: active Quality of Participation: attentive and cooperative Interactions with others: gave feedback Mood/Affect: appropriate Triggers (if applicable): n/a Cognition: coherent/clear Progress: Gaining insight Response: pt says he will lean how to say no more often to maintain healthy boundaries in relationships  Plan: patient will be encouraged to attend future RN education groups  Patients Problems:  Patient Active Problem List   Diagnosis Date Noted   MDD (major depressive disorder), recurrent episode, moderate (HCC) 09/03/2023   Schizophrenia (HCC) 09/02/2023   Elevated troponin 10/21/2021   Substance abuse (HCC) 10/21/2021   GERD (gastroesophageal reflux disease) 10/21/2021   SVT (supraventricular tachycardia) (HCC) 10/21/2021   Leukocytosis 10/21/2021   Hypoxia    Acute respiratory failure with hypoxia and hypercarbia (HCC)    Respiratory failure (HCC) 10/07/2021   Schizoaffective disorder (HCC) 04/03/2018   Cannabis abuse 04/03/2018   Suicidal behavior with attempted self-injury (HCC) 04/02/2018   Bipolar 1 disorder, mixed (HCC) 02/28/2013   DISORDER, BIPOLAR NOS 06/05/2006   TOBACCO ABUSE 06/05/2006   Allergic rhinitis 06/05/2006   Asthma 06/05/2006   SEBACEOUS CYST 06/05/2006   LOW BACK PAIN, CHRONIC 06/05/2006   MALAISE AND FATIGUE 06/05/2006

## 2023-09-06 DIAGNOSIS — F259 Schizoaffective disorder, unspecified: Secondary | ICD-10-CM | POA: Diagnosis not present

## 2023-09-06 DIAGNOSIS — Z79899 Other long term (current) drug therapy: Secondary | ICD-10-CM | POA: Diagnosis not present

## 2023-09-06 DIAGNOSIS — Z5901 Sheltered homelessness: Secondary | ICD-10-CM | POA: Diagnosis not present

## 2023-09-06 DIAGNOSIS — Z0389 Encounter for observation for other suspected diseases and conditions ruled out: Secondary | ICD-10-CM | POA: Diagnosis not present

## 2023-09-06 DIAGNOSIS — F1220 Cannabis dependence, uncomplicated: Secondary | ICD-10-CM | POA: Diagnosis not present

## 2023-09-06 MED ORDER — ALBUTEROL SULFATE HFA 108 (90 BASE) MCG/ACT IN AERS: 1.0000 | INHALATION_SPRAY | RESPIRATORY_TRACT | 0 refills | Status: DC | PRN

## 2023-09-06 MED ORDER — CARVEDILOL 3.125 MG PO TABS: 3.1250 mg | ORAL_TABLET | Freq: Two times a day (BID) | ORAL | 0 refills | Status: DC

## 2023-09-06 MED ORDER — BUDESON-GLYCOPYRROL-FORMOTEROL 160-9-4.8 MCG/ACT IN AERO
2.0000 | INHALATION_SPRAY | Freq: Two times a day (BID) | RESPIRATORY_TRACT | 0 refills | Status: DC
Start: 2023-09-06 — End: 2023-12-30

## 2023-09-06 MED ORDER — HYDROXYZINE HCL 25 MG PO TABS: 25.0000 mg | ORAL_TABLET | Freq: Three times a day (TID) | ORAL | 0 refills | Status: DC | PRN

## 2023-09-06 MED ORDER — OLANZAPINE 10 MG PO TABS: 10.0000 mg | ORAL_TABLET | Freq: Every day | ORAL | 0 refills | Status: DC

## 2023-09-06 MED ORDER — LORATADINE 10 MG PO TABS: 10.0000 mg | ORAL_TABLET | Freq: Every day | ORAL | 0 refills | Status: DC | PRN

## 2023-09-06 NOTE — ED Notes (Signed)
 Stable. A&O x 4.  Discharging to [facility] via [place] [facility]SABRA    Pt provided with 7 days of home medications and prescriptions e scribed to pt preferred pharmacy .   Denies current SI plan and Intent.  Denies HI and A/V hallucinations.   All belongings returned to PT.  F/U instruction reviewed   Pt verbalized understanding

## 2023-09-06 NOTE — ED Notes (Signed)
 Pt observed sitting in dayroom.  Calm and cooperative.  Pt is up for discharge today. No needs identified at present. Pt did not endorse SI plan or intent. Q 15 minute observations for safety continue

## 2023-09-06 NOTE — Group Note (Signed)
 Group Topic: Fears and Unhealthy Coping Skills  Group Date: 09/06/2023 Start Time: 1000 End Time: 1145 Facilitators: Lonzell Dwayne RAMAN, NT  Department: [facility]  Number of Participants: 4  Group Focus: chemical dependency issues Treatment Modality:  Patient-Centered Therapy and Spiritual Interventions utilized were story telling Purpose: regain self-worth  Name: Wesley Beck Date of Birth: 08/01/1970  MR: [id number]    Level of Participation: Patient attended groupactive Quality of Participation: supportive Interactions with others: gave feedback Mood/Affect: positive Triggers (if applicable): N/A Cognition: coherent/clear Progress: Moderate Response: Good  Plan: patient will be encouraged to continue to be driven in his sobriety.  Patients Problems:  Patient Active Problem List   Diagnosis Date Noted   MDD (major depressive disorder), recurrent episode, moderate (HCC) 09/03/2023   Schizophrenia (HCC) 09/02/2023   Elevated troponin 10/21/2021   Substance abuse (HCC) 10/21/2021   GERD (gastroesophageal reflux disease) 10/21/2021   SVT (supraventricular tachycardia) (HCC) 10/21/2021   Leukocytosis 10/21/2021   Hypoxia    Acute respiratory failure with hypoxia and hypercarbia (HCC)    Respiratory failure (HCC) 10/07/2021   Schizoaffective disorder (HCC) 04/03/2018   Cannabis abuse 04/03/2018   Suicidal behavior with attempted self-injury (HCC) 04/02/2018   Bipolar 1 disorder, mixed (HCC) 02/28/2013   DISORDER, BIPOLAR NOS 06/05/2006   TOBACCO ABUSE 06/05/2006   Allergic rhinitis 06/05/2006   Asthma 06/05/2006   SEBACEOUS CYST 06/05/2006   LOW BACK PAIN, CHRONIC 06/05/2006   MALAISE AND FATIGUE 06/05/2006

## 2023-09-06 NOTE — ED Notes (Signed)
 Patient is sleeping. Respirations equal and unlabored. No change in assessment or acuity. Routine safety checks conducted according to facility protocol.

## 2023-09-06 NOTE — ED Provider Notes (Signed)
 FBC/OBS ASAP Discharge Summary  Date and Time: 09/06/2023 10:39 AM  Name: Wesley Beck  MRN:  [id number]   Discharge Diagnoses:  Final diagnoses:  Schizoaffective disorder, unspecified type (HCC)  Cannabis dependence (HCC)    Subjective: Patient is euthymic and denies depression or anxiety. Reports good sleeep and appetite and states I'm fine. Has been calm, cooperative with staff on the unit. NO longer voicing paranoid, denies any delusions. Denies AVH. Patient is not responding to internal stimuli. Has been working with social work to find a shelter and agreeable to discharge to shelter today. I discussed with him importance of followup with his PCP to discuss his statin regimen. Patient denies denies SI or HI. Denies wanting to be dead. States I just don't want to go back to [facility].  Stay Summary:   Patient was admitted to inpatient psychiatry at [facility] for safety and stabilization. Patient was provided safe and therapeutic milieu, psychiatric and medical assessment, care and treatment, as well as support from nursing, behavioral health staff. Both psychotherapy and psychoeducation groups were provided. Different coping skills such as journaling, CBT and art therapy groups were offered. Additional consultation was provided by hospitalist for H&P and medical needs.  Patient was started on Olanzapine  10 mg qhs during the admission for paranoia and psychosis. Patient tolerated without side effects and medications were titrated to therapeutic effect. As patient stabilized on medications and participated in therapeutic interventions, symptoms began to improve. Patient denied SI, HI or AVH for >48 hours prior to discharge. I strongly recommended patient cease cannabis use as he himself told me it makes him paranoid. Patient adamantly denies any thoughts of harming anyone or family for the last 2 days.   On the day of discharge, the chart was reviewed, case was discussed with staff  and the patient was seen in person. Patient's overall mood has improved. Patient was calm and cooperative and did not appear anxious. Patient reported adequate sleep and stable mood. Patient was tolerating medications well without side effects reported or noted. Patient denied suicidal ideation, plan or intent, denied hopelessness, helplessness or worthlessness, and denied homicidal ideation. Insight and judgement have improved. Patient demonstrated future orientation and was motivated to follow-up with aftercare. Patient was encouraged to be adherent with medications. Patient was instructed to call 911, ask for help to go to the closest emergency room or crisis center, call crisis hotlines for help if in critical status or when symptoms were worsening. Patient voiced understanding of this information. At the time of discharge, patient had reached maximum benefit from hospitalization, was no longer considered to be dangerous to self or others, and was psychiatrically stable and otherwise appropriate for discharge to less restrictive care in the community.   Medical Hospital Course: Patient was seen by the hospitalist for routine admission examination. Medications for chronic conditions were continued.  Medical hospital course was otherwise unremarkable.    Total Time spent with patient: 30 minutes  Past Psychiatric History:   Reports multiple past psychiatric admissions and notes hx of involuntary commitment, reports he's found it helpful. Current medications: Reports he takes zyprexa  5mg  po at bedtime and then when I feel like I need it qAM. Patient was last discharged from [facility] on Zyprexa  10 mg at bedtime. He reports hx of abilify use for approximately 15 years is not sure if it was helpful - prior suicide attempts. Last attempted suicide approximately 25 years ago by overdose of pills.   Past Medical History:  Past  Medical History:  Diagnosis Date   Arthritis    Asthma    Bipolar 1 disorder (HCC)     Chronic back pain    Depression    Schizophrenia, paranoid type (HCC)    Seasonal allergies     Family History:  Patient denies significant psychiatric family history.    Social History: living in shed outside family's house. History of incarceration for theft.  Tobacco Cessation:  A prescription for an FDA-approved tobacco cessation medication was offered at discharge and the patient refused  Current Medications:  Current Facility-Administered Medications  Medication Dose Route Frequency Provider Last Rate Last Admin   acetaminophen  (TYLENOL ) tablet 650 mg  650 mg Oral Q6H PRN White, [name], NP       albuterol  (VENTOLIN  HFA) 108 (90 Base) MCG/ACT inhaler 1-2 puff  1-2 puff Inhalation Q4H PRN Lili Harts, MD       alum & mag hydroxide-simeth (MAALOX/MYLANTA) 200-200-20 MG/5ML suspension 30 mL  30 mL Oral Q4H PRN White, [name], NP       budesonide -glycopyrrolate -formoterol  (BREZTRI ) 160-9-4.8 MCG/ACT inhaler 2 puff  2 puff Inhalation BID Jakyrie Totherow, MD   2 puff at 09/06/23 0825   carvedilol  (COREG ) tablet 3.125 mg  3.125 mg Oral BID WC Kirsten Mckone, MD   3.125 mg at 09/06/23 9175   haloperidol  (HALDOL ) tablet 5 mg  5 mg Oral TID PRN White, [name], NP       And   diphenhydrAMINE  (BENADRYL ) capsule 50 mg  50 mg Oral TID PRN White, [name], NP       haloperidol  lactate (HALDOL ) injection 5 mg  5 mg Intramuscular TID PRN White, [name], NP       And   diphenhydrAMINE  (BENADRYL ) injection 50 mg  50 mg Intramuscular TID PRN White, [name], NP       And   LORazepam  (ATIVAN ) injection 2 mg  2 mg Intramuscular TID PRN White, [name], NP       haloperidol  lactate (HALDOL ) injection 10 mg  10 mg Intramuscular TID PRN White, [name], NP       And   diphenhydrAMINE  (BENADRYL ) injection 50 mg  50 mg Intramuscular TID PRN White, [name], NP       And   LORazepam  (ATIVAN ) injection 2 mg  2 mg Intramuscular TID PRN White, [name], NP       hydrOXYzine  (ATARAX ) tablet  25 mg  25 mg Oral TID PRN White, [name], NP       loratadine  (CLARITIN ) tablet 10 mg  10 mg Oral Daily PRN Georgie Haque, MD       magnesium  hydroxide (MILK OF MAGNESIA) suspension 30 mL  30 mL Oral Daily PRN White, [name], NP       OLANZapine  (ZYPREXA ) tablet 10 mg  10 mg Oral QHS Bladen Umar, MD   10 mg at 09/05/23 2114   rosuvastatin  (CRESTOR ) tablet 10 mg  10 mg Oral Daily Duquan Gillooly, MD   10 mg at 09/06/23 9175   traZODone  (DESYREL ) tablet 50 mg  50 mg Oral QHS PRN White, [name], NP   50 mg at 09/05/23 2114   Current Outpatient Medications  Medication Sig Dispense Refill   albuterol  (VENTOLIN  HFA) 108 (90 Base) MCG/ACT inhaler Inhale 1-2 puffs into the lungs every 4 (four) hours as needed for wheezing or shortness of breath. 17 g 0   atorvastatin  (LIPITOR) 20 MG tablet Take 20 mg by mouth See  admin instructions. Take it on Sunday, Tuesday, Wednesday, Thursday, and Saturday as needed for high cholesterol.     budesonide -glycopyrrolate -formoterol  (BREZTRI ) 160-9-4.8 MCG/ACT AERO inhaler Inhale 2 puffs into the lungs 2 (two) times daily. 10.7 g 0   carvedilol  (COREG ) 3.125 MG tablet Take 1 tablet (3.125 mg total) by mouth 2 (two) times daily with a meal. 60 tablet 0   Coenzyme Q10 (COQ10 PO) Take 1 tablet by mouth 3 (three) times a week. Monday, Wednesday, and Friday     hydrOXYzine  (ATARAX ) 25 MG tablet Take 1 tablet (25 mg total) by mouth 3 (three) times daily as needed for anxiety. 30 tablet 0   loratadine  (CLARITIN ) 10 MG tablet Take 1 tablet (10 mg total) by mouth daily as needed for allergies. 30 tablet 0   OLANZapine  (ZYPREXA ) 10 MG tablet Take 1 tablet (10 mg total) by mouth at bedtime. 30 tablet 0   rosuvastatin  (CRESTOR ) 10 MG tablet Take 10 mg by mouth 2 (two) times a week. Monday and Friday     thiamine (VITAMIN B1) 100 MG tablet Take 100 mg by mouth daily.     zinc gluconate 50 MG tablet Take 50 mg by mouth daily.      PTA Medications:  Facility Ordered Medications   Medication   acetaminophen  (TYLENOL ) tablet 650 mg   alum & mag hydroxide-simeth (MAALOX/MYLANTA) 200-200-20 MG/5ML suspension 30 mL   magnesium  hydroxide (MILK OF MAGNESIA) suspension 30 mL   haloperidol  (HALDOL ) tablet 5 mg   And   diphenhydrAMINE  (BENADRYL ) capsule 50 mg   haloperidol  lactate (HALDOL ) injection 5 mg   And   diphenhydrAMINE  (BENADRYL ) injection 50 mg   And   LORazepam  (ATIVAN ) injection 2 mg   haloperidol  lactate (HALDOL ) injection 10 mg   And   diphenhydrAMINE  (BENADRYL ) injection 50 mg   And   LORazepam  (ATIVAN ) injection 2 mg   hydrOXYzine  (ATARAX ) tablet 25 mg   traZODone  (DESYREL ) tablet 50 mg   albuterol  (VENTOLIN  HFA) 108 (90 Base) MCG/ACT inhaler 1-2 puff   carvedilol  (COREG ) tablet 3.125 mg   loratadine  (CLARITIN ) tablet 10 mg   OLANZapine  (ZYPREXA ) tablet 10 mg   rosuvastatin  (CRESTOR ) tablet 10 mg   budesonide -glycopyrrolate -formoterol  (BREZTRI ) 160-9-4.8 MCG/ACT inhaler 2 puff   PTA Medications  Medication Sig   atorvastatin  (LIPITOR) 20 MG tablet Take 20 mg by mouth See admin instructions. Take it on Sunday, Tuesday, Wednesday, Thursday, and Saturday as needed for high cholesterol.   rosuvastatin  (CRESTOR ) 10 MG tablet Take 10 mg by mouth 2 (two) times a week. Monday and Friday   thiamine (VITAMIN B1) 100 MG tablet Take 100 mg by mouth daily.   carvedilol  (COREG ) 3.125 MG tablet Take 1 tablet (3.125 mg total) by mouth 2 (two) times daily with a meal.   OLANZapine  (ZYPREXA ) 10 MG tablet Take 1 tablet (10 mg total) by mouth at bedtime.   hydrOXYzine  (ATARAX ) 25 MG tablet Take 1 tablet (25 mg total) by mouth 3 (three) times daily as needed for anxiety.   albuterol  (VENTOLIN  HFA) 108 (90 Base) MCG/ACT inhaler Inhale 1-2 puffs into the lungs every 4 (four) hours as needed for wheezing or shortness of breath.   loratadine  (CLARITIN ) 10 MG tablet Take 1 tablet (10 mg total) by mouth daily as needed for allergies.   budesonide -glycopyrrolate -formoterol   (BREZTRI ) 160-9-4.8 MCG/ACT AERO inhaler Inhale 2 puffs into the lungs 2 (two) times daily.       09/06/2023   10:38 AM  Depression screen PHQ 2/9  Decreased Interest 0  Down, Depressed, Hopeless 0  PHQ - 2 Score 0    Flowsheet Row ED from 09/02/2023 in [facility] Most recent reading at 09/03/2023 10:55 PM ED from 09/02/2023 in [facility] Emergency Department at [facility] Most recent reading at 09/02/2023 11:54 AM ED from 08/23/2023 in [facility] Emergency Department at [facility] Most recent reading at 08/23/2023  1:12 AM  C-SSRS RISK CATEGORY No Risk High Risk High Risk    Musculoskeletal  Strength & Muscle Tone: within normal limits Gait & Station: normal Patient leans: N/A  Psychiatric Specialty Exam  Presentation  General Appearance:  Appropriate for Environment  Eye Contact: Fair  Speech: Clear and Coherent  Speech Volume: Normal  Handedness: Right   Mood and Affect  Mood: Euthymic  Affect: Congruent   Thought Process  Thought Processes: Coherent  Descriptions of Associations:Intact  Orientation:Full (Time, Place and Person)  Thought Content:Logical  Diagnosis of Schizophrenia or Schizoaffective disorder in past: Yes  Duration of Psychotic Symptoms: Greater than six months   Hallucinations:Hallucinations: None  Ideas of Reference:None  Suicidal Thoughts:Suicidal Thoughts: No  Homicidal Thoughts:Homicidal Thoughts: No   Sensorium  Memory: Immediate Fair  Judgment: Fair  Insight: Fair   Art therapist  Concentration: Fair  Attention Span: Fair  Recall: [employer] of Knowledge: Fair  Language: Fair   Psychomotor Activity  Psychomotor Activity: Psychomotor Activity: Normal   Assets  Assets: Communication Skills; Desire for Improvement; Physical Health; Resilience   Sleep  Sleep: Sleep: Fair  Estimated Sleeping Duration (Last 24 Hours): 6.50-8.25  hours  No data recorded  Physical Exam  Physical exam: Please see exam on admit note. General: Well developed, well nourished.  Pupils: Normal at 3mm Respiratory: Breathing is unlabored.  Cardiovascular: No edema.  Language: No anomia, no aphasia Muscle strength and tone-pt moving all extremities.  Gait not assessed as pt remained in bed.  Neuro: Facial muscles are symmetric. Pt without tremor, no evidence of hyperarousal.  Review of Systems  Constitutional: Negative.   HENT: Negative.    Eyes: Negative.   Respiratory: Negative.    Cardiovascular: Negative.   Gastrointestinal: Negative.   Genitourinary: Negative.   Musculoskeletal: Negative.   Skin: Negative.   Neurological: Negative.   Endo/Heme/Allergies: Negative.   Psychiatric/Behavioral: Negative.     Blood pressure (!) 150/90, pulse 69, temperature 99.2 F (37.3 C), temperature source Oral, resp. rate 18, SpO2 96%. There is no height or weight on file to calculate BMI.  Demographic Factors:  Male, Caucasian, Low socioeconomic status, Living alone, and Unemployed  Loss Factors: Legal issues and Financial problems/change in socioeconomic status  Historical Factors: Prior suicide attempts and Impulsivity  Risk Reduction Factors:   Positive social support, Positive therapeutic relationship, and Positive coping skills or problem solving skills  Continued Clinical Symptoms:  Previous Psychiatric Diagnoses and Treatments  Cognitive Features That Contribute To Risk:  None    Suicide Risk:  Minimal: No identifiable suicidal ideation.  Patients presenting with no risk factors but with morbid ruminations; may be classified as minimal risk based on the severity of the depressive symptoms  Patient denies SI or HI for >48 hours. Denies wanting to be dead and future oriented. SRA complete and acute risk for suicide is low.   [place] Suicide Risk assessment:  1. Do you wish to be dead? NONE REPORTED 2. Have you wished  your dead or wished you could go to sleep and not wake up? NONE  REPORTED 3.  Have you actually had thoughts of killing yourself?  NONE REPORTED 4.  Have you been thinking about how you might do this?  NONE REPORTED 5.  Have you had these thoughts and some intention of acting on them? NONE REPORTED 6.  Have you started to work out or worked out the details to kill yourself? NONE REPORTED 7.  Do you intend to carry out this plan? NONE REPORTED 8. On a scale of 1-5 with 1 being the least severe and 5 being the most severe answer the following questions place for intensity of ideation. ZERO 9. How many times have you had these thoughts? NONE REPORTED 10. When you have the thoughts how long to the last?  NONE REPORTED 11. Control ability.  Could you or can you stop thinking about killing herself or wanting to die if you want to?  YES 12. Are there any things anyone or anything family religion pain of death that stop you from wanting to die or acting on thoughts of committing suicide?  FAMILY 13.  What sort of reason to do have to think about wanting to die or killing yourself? NONE REPORTED 14.Was it to end the pain or stop the way you are feeling in other words you could not go on living with his pain or how you are feeling or was not to get attention revenge or reaction from others?  Or both?  NONE REPORTED  [place] Suicide Risk assessment:  1. Do you wish to be dead? NONE REPORTED 2. Have you wished your dead or wished you could go to sleep and not wake up? NONE REPORTED 3.  Have you actually had thoughts of killing yourself?  NONE REPORTED 4.  Have you been thinking about how you might do this?  NONE REPORTED 5.  Have you had these thoughts and some intention of acting on them? NONE REPORTED 6.  Have you started to work out or worked out the details to kill yourself? NONE REPORTED 7.  Do you intend to carry out this plan? NONE REPORTED 8. On a scale of 1-5 with 1 being the least severe and 5  being the most severe answer the following questions place for intensity of ideation. ZERO 9. How many times have you had these thoughts? NONE REPORTED 10. When you have the thoughts how long to the last?  NONE REPORTED 11. Control ability.  Could you or can you stop thinking about killing herself or wanting to die if you want to?  YES 12. Are there any things anyone or anything family religion pain of death that stop you from wanting to die or acting on thoughts of committing suicide?  FAMILY 13.  What sort of reason to do have to think about wanting to die or killing yourself? NONE REPORTED 14.Was it to end the pain or stop the way you are feeling in other words you could not go on living with his pain or how you are feeling or was not to get attention revenge or reaction from others?  Or both?  NONE REPORTED   Plan Of Care/Follow-up recommendations:  [facility] eval as on discharge instructions, 7 days of samples and 30 day scripts provided Followup with PCP  Disposition: discharge to shelter at noon today; [facility] for the [facility] [address] [place], [place] [address] Closes 8?PM  Atwood Adcock, MD 09/06/2023, 10:39 AM

## 2023-09-09 DIAGNOSIS — R609 Edema, unspecified: Secondary | ICD-10-CM | POA: Diagnosis not present

## 2023-09-09 DIAGNOSIS — F17210 Nicotine dependence, cigarettes, uncomplicated: Secondary | ICD-10-CM | POA: Diagnosis not present

## 2023-09-09 DIAGNOSIS — L84 Corns and callosities: Secondary | ICD-10-CM | POA: Diagnosis not present

## 2023-09-09 DIAGNOSIS — M79673 Pain in unspecified foot: Secondary | ICD-10-CM | POA: Diagnosis not present

## 2023-09-09 DIAGNOSIS — Z591 Inadequate housing, unspecified: Secondary | ICD-10-CM | POA: Diagnosis not present

## 2023-09-09 DIAGNOSIS — M79672 Pain in left foot: Secondary | ICD-10-CM | POA: Diagnosis not present

## 2023-09-09 DIAGNOSIS — M79671 Pain in right foot: Secondary | ICD-10-CM | POA: Diagnosis not present

## 2023-09-09 DIAGNOSIS — L304 Erythema intertrigo: Secondary | ICD-10-CM | POA: Diagnosis not present

## 2023-09-09 DIAGNOSIS — R269 Unspecified abnormalities of gait and mobility: Secondary | ICD-10-CM | POA: Diagnosis not present

## 2023-09-09 DIAGNOSIS — Z639 Problem related to primary support group, unspecified: Secondary | ICD-10-CM | POA: Diagnosis not present

## 2023-11-30 DIAGNOSIS — E7849 Other hyperlipidemia: Secondary | ICD-10-CM | POA: Diagnosis not present

## 2023-11-30 DIAGNOSIS — Z23 Encounter for immunization: Secondary | ICD-10-CM | POA: Diagnosis not present

## 2023-11-30 DIAGNOSIS — J449 Chronic obstructive pulmonary disease, unspecified: Secondary | ICD-10-CM | POA: Diagnosis not present

## 2023-11-30 DIAGNOSIS — R000 Tachycardia, unspecified: Secondary | ICD-10-CM | POA: Diagnosis not present

## 2023-12-03 ENCOUNTER — Emergency Department (HOSPITAL_COMMUNITY)
Admission: EM | Admit: 2023-12-03 | Discharge: 2023-12-04 | Disposition: A | Discharge status: Home / Self Care | Attending: Emergency Medicine | Admitting: Emergency Medicine

## 2023-12-03 ENCOUNTER — Encounter (HOSPITAL_COMMUNITY): Payer: Self-pay

## 2023-12-03 ENCOUNTER — Other Ambulatory Visit: Payer: Self-pay

## 2023-12-03 DIAGNOSIS — F209 Schizophrenia, unspecified: Secondary | ICD-10-CM | POA: Diagnosis not present

## 2023-12-03 DIAGNOSIS — F419 Anxiety disorder, unspecified: Secondary | ICD-10-CM | POA: Insufficient documentation

## 2023-12-03 DIAGNOSIS — F329 Major depressive disorder, single episode, unspecified: Secondary | ICD-10-CM | POA: Diagnosis not present

## 2023-12-03 DIAGNOSIS — R456 Violent behavior: Secondary | ICD-10-CM | POA: Insufficient documentation

## 2023-12-03 DIAGNOSIS — R45851 Suicidal ideations: Secondary | ICD-10-CM | POA: Diagnosis not present

## 2023-12-03 DIAGNOSIS — R4689 Other symptoms and signs involving appearance and behavior: Secondary | ICD-10-CM

## 2023-12-03 DIAGNOSIS — F1910 Other psychoactive substance abuse, uncomplicated: Secondary | ICD-10-CM

## 2023-12-03 DIAGNOSIS — F259 Schizoaffective disorder, unspecified: Secondary | ICD-10-CM | POA: Diagnosis present

## 2023-12-03 DIAGNOSIS — J45909 Unspecified asthma, uncomplicated: Secondary | ICD-10-CM | POA: Insufficient documentation

## 2023-12-03 LAB — COMPREHENSIVE METABOLIC PANEL WITH GFR
ALT: 29 U/L (ref 0–44)
AST: 36 U/L (ref 15–41)
Albumin: 4.8 g/dL (ref 3.5–5.0)
Alkaline Phosphatase: 102 U/L (ref 38–126)
Anion gap: 12 (ref 5–15)
BUN: 12 mg/dL (ref 6–20)
CO2: 24 mmol/L (ref 22–32)
Calcium: 9.4 mg/dL (ref 8.9–10.3)
Chloride: 100 mmol/L (ref 98–111)
Creatinine, Ser: 0.96 mg/dL (ref 0.61–1.24)
GFR, Estimated: >60 mL/min (ref >60)
Glucose, Bld: 117 mg/dL — ABNORMAL HIGH (ref 70–99)
Potassium: 3.9 mmol/L (ref 3.5–5.1)
Sodium: 136 mmol/L (ref 135–145)
Total Bilirubin: 0.8 mg/dL (ref 0.0–1.2)
Total Protein: 7.4 g/dL (ref 6.5–8.1)

## 2023-12-03 LAB — CBC
HCT: 46.3 % (ref 39.0–52.0)
Hemoglobin: 16.4 g/dL (ref 13.0–17.0)
MCH: 32.9 pg (ref 26.0–34.0)
MCHC: 35.4 g/dL (ref 30.0–36.0)
MCV: 93 fL (ref 80.0–100.0)
Platelets: 296 K/uL (ref 150–400)
RBC: 4.98 MIL/uL (ref 4.22–5.81)
RDW: 12 % (ref 11.5–15.5)
WBC: 7.3 K/uL (ref 4.0–10.5)
nRBC: 0 % (ref 0.0–0.2)

## 2023-12-03 LAB — URINE DRUG SCREEN
Amphetamines: NEGATIVE
Barbiturates: NEGATIVE
Benzodiazepines: NEGATIVE
Cocaine: NEGATIVE
Fentanyl: NEGATIVE
Methadone Scn, Ur: NEGATIVE
Opiates: NEGATIVE
Tetrahydrocannabinol: POSITIVE — AB

## 2023-12-03 LAB — ETHANOL: Alcohol, Ethyl (B): <15 mg/dL (ref <15)

## 2023-12-03 MED ORDER — CARVEDILOL 3.125 MG PO TABS
3.1250 mg | ORAL_TABLET | Freq: Two times a day (BID) | ORAL | Status: DC
  Administered 2023-12-03 – 2023-12-04 (×2): 3.125 mg via ORAL
  Filled 2023-12-03 (×2): qty 1

## 2023-12-03 MED ORDER — HYDROXYZINE HCL 25 MG PO TABS
25.0000 mg | ORAL_TABLET | Freq: Three times a day (TID) | ORAL | Status: DC | PRN
  Administered 2023-12-03: 25 mg via ORAL
  Filled 2023-12-03: qty 1

## 2023-12-03 MED ORDER — LORAZEPAM 1 MG PO TABS: 1.0000 mg | ORAL_TABLET | ORAL | Status: DC | PRN

## 2023-12-03 MED ORDER — OLANZAPINE 5 MG PO TABS
10.0000 mg | ORAL_TABLET | Freq: Every day | ORAL | Status: DC
  Administered 2023-12-03: 10 mg via ORAL
  Filled 2023-12-03: qty 2

## 2023-12-03 MED ORDER — ZIPRASIDONE MESYLATE 20 MG IM SOLR: 20.0000 mg | INTRAMUSCULAR | Status: DC | PRN

## 2023-12-03 MED ORDER — RISPERIDONE 1 MG PO TBDP
2.0000 mg | ORAL_TABLET | Freq: Three times a day (TID) | ORAL | Status: DC | PRN
  Administered 2023-12-03: 2 mg via ORAL
  Filled 2023-12-03: qty 2

## 2023-12-03 MED ORDER — ALBUTEROL SULFATE HFA 108 (90 BASE) MCG/ACT IN AERS
1.0000 | INHALATION_SPRAY | RESPIRATORY_TRACT | Status: DC | PRN
  Filled 2023-12-03: qty 6.7

## 2023-12-03 NOTE — ED Provider Notes (Signed)
 [place] EMERGENCY DEPARTMENT AT [facility] Provider Note   CSN: [id number] Arrival date & time: 12/03/23  1245     Patient presents with: Medical Clearance   Wesley Beck is a 53 y.o. male.   HPI Patient presents with request for assistance with homicidal ideation, depression. Patient has history of schizophrenia, substance abuse, was recently incarcerated.  He notes that he is overwhelmed by stress from his family, feels as though he is going to kill someone, or kill himself.  No complaints of physical pain.  No obvious precipitant, patient describes longstanding worsening family stress.    Prior to Admission medications   Medication Sig Start Date End Date Taking? Authorizing Provider  albuterol  (VENTOLIN  HFA) 108 (90 Base) MCG/ACT inhaler Inhale 1-2 puffs into the lungs every 4 (four) hours as needed for wheezing or shortness of breath. 09/06/23   Zouev, Dmitri, MD  atorvastatin  (LIPITOR) 20 MG tablet Take 20 mg by mouth See admin instructions. Take it on Sunday, Tuesday, Wednesday, Thursday, and Saturday as needed for high cholesterol. 05/08/23   [provider]  budesonide -glycopyrrolate -formoterol  (BREZTRI ) 160-9-4.8 MCG/ACT AERO inhaler Inhale 2 puffs into the lungs 2 (two) times daily. 09/06/23   Zouev, Dmitri, MD  carvedilol  (COREG ) 3.125 MG tablet Take 1 tablet (3.125 mg total) by mouth 2 (two) times daily with a meal. 09/06/23 10/06/23  Zouev, Dmitri, MD  Coenzyme Q10 (COQ10 PO) Take 1 tablet by mouth 3 (three) times a week. Monday, Wednesday, and Friday    [provider]  hydrOXYzine  (ATARAX ) 25 MG tablet Take 1 tablet (25 mg total) by mouth 3 (three) times daily as needed for anxiety. 09/06/23   Zouev, Dmitri, MD  loratadine  (CLARITIN ) 10 MG tablet Take 1 tablet (10 mg total) by mouth daily as needed for allergies. 09/06/23   Zouev, Dmitri, MD  OLANZapine  (ZYPREXA ) 10 MG tablet Take 1 tablet (10 mg total) by mouth at bedtime. 09/06/23   Zouev,  Dmitri, MD  rosuvastatin  (CRESTOR ) 10 MG tablet Take 10 mg by mouth 2 (two) times a week. Monday and Friday 06/01/23   [provider]  thiamine (VITAMIN B1) 100 MG tablet Take 100 mg by mouth daily.    [provider]  zinc gluconate 50 MG tablet Take 50 mg by mouth daily.    [provider]    Allergies: Cat dander and Dog epithelium (canis lupus familiaris)    Review of Systems  Updated Vital Signs BP (!) 150/95 (BP Location: Left Arm)   Pulse 73   Temp 99.2 F (37.3 C) (Oral)   Resp 17   Ht 1.753 m (5' 9)   Wt 97.5 kg   SpO2 97%   BMI 31.75 kg/m   Physical Exam Vitals and nursing note reviewed.  Constitutional:      General: He is not in acute distress.    Appearance: He is well-developed.  HENT:     Head: Normocephalic and atraumatic.  Eyes:     Conjunctiva/sclera: Conjunctivae normal.  Pulmonary:     Effort: Pulmonary effort is normal. No respiratory distress.     Breath sounds: No stridor.  Abdominal:     General: There is no distension.  Skin:    General: Skin is warm and dry.  Neurological:     Mental Status: He is alert and oriented to person, place, and time.  Psychiatric:        Behavior: Behavior is agitated and hyperactive.  Cognition and Memory: Cognition is not impaired. Memory is not impaired.     (all labs ordered are listed, but only abnormal results are displayed) Labs Reviewed  CBC  COMPREHENSIVE METABOLIC PANEL WITH GFR  ETHANOL  URINE DRUG SCREEN    EKG: None  Radiology: No results found.   Procedures   Medications Ordered in the ED - No data to display                                  Medical Decision Making Adult male with history of prior suicide attempts, recent incarceration, psychiatric history of schizoaffective disorder now presents with thoughts of self-harm, and harming others.  Patient has no physical complaints, vital signs are unremarkable, given his elevated risk profile with  psychiatric disease, history of substance abuse, recent incarceration/disenfranchised status, patient with elevated risk.  Patient had labs and was medically cleared for behavioral eval.  Amount and/or Complexity of Data Reviewed External Data Reviewed: notes. Labs: ordered. Decision-making details documented in ED Course.  Risk Prescription drug management. Decision regarding hospitalization. Diagnosis or treatment significantly limited by social determinants of health.   Labs noncontributory, THC positive otherwise unremarkable.  Patient is medically clear for behavioral evaluation.     Final diagnoses:  Aggressive behavior  Suicidal ideation  Anxiety    ED Discharge Orders     None          Garrick Charleston, MD 12/03/23 1447

## 2023-12-03 NOTE — ED Notes (Signed)
 Patient belongings placed in locker to include:   Bible in bound bible cover, small notebook, vape (appears empty), lighter, glasses, can of smokeless tobacco, wallet (debit card, no cash, torn on admission, ID), albuterol  inhaler, misc. Bracelets made of fabric.   Belongings verified with [place], [employer]. No cash noted.   12/03/23 1358  Patient Belongings  Patient/Family advised about valuables policy? Yes  Home Medications No meds brought to hospital  Patient Belongings Put in locker  Belongings in [place] (Preop/Short Stay/[facility] only) Glasses;Purse/Wallet;Clothing  Locker: Clothing Description boots, pants, shirt, belt   Patient wanted by security.

## 2023-12-03 NOTE — ED Notes (Signed)
 This RN spoke with staff at [facility] about patient behavior and results at this time.

## 2023-12-03 NOTE — ED Notes (Signed)
 Pt appears to be getting increasingly agitated, speaking loudly and cussing in reference to family dynamics. Security called for standby assistance. Pt cooperative and calm with this RN when offering PRN medications. Pt aggreable to take medicine at this time. Pt also given soda and sandwich per pt request.

## 2023-12-03 NOTE — Consult Note (Signed)
 [facility] Psychiatric Consult Initial  Patient Name: .Wesley Beck  MRN: [id number]  DOB: [birth year]  Consult Order details:  Orders (From admission, onward)     Start     Ordered   12/03/23 1448  CONSULT TO CALL ACT TEAM       Ordering Provider: Garrick Charleston, MD  Provider:  (Not yet assigned)  Question:  Reason for Consult?  Answer:  Psych consult   12/03/23 1448             Mode of Visit: Tele-visit Tele-visit Virtual Statement:TELE PSYCHIATRY ATTESTATION & CONSENT As the provider for this telehealth consult, I attest that I verified the patient's identity using two separate identifiers, introduced myself to the patient, provided my credentials, disclosed my location, and performed this encounter via a HIPAA-compliant, real-time, face-to-face, two-way, interactive audio and video platform and with the full consent and agreement of the patient (or guardian as applicable.) Patient physical location: [place]. Telehealth provider physical location: home office in state of [place].   Video start time: 0430pm Video end time: 0500pm   Psychiatry Consult Evaluation  Service Date: December 03, 2023 LOS:  LOS: 0 days  Chief Complaint   Primary Psychiatric Diagnoses  Schizophrenia 2.  Substance abuse 3.  MDD  Assessment  Wesley Beck is a 53 y.o. male admitted: Presented to the EDfor 12/03/2023 12:54 PM suicidal/homicidal ideations. He carries the psychiatric diagnoses of Schizophrenia and substance use and has a past medical history of  GERD, SVT, Chronic back pain.   His current presentation of agitations, restlessness,  is most consistent with his current  condition, his mental health hx and his psychosocial stressors . He meets criteria for Inpatient  based on present, past and reported symptoms.  Current outpatient psychotropic medications include Olanzapine  and hydroxyzine  and historically he has had a therapeutic  response to these medications. He was not compliant with  medications prior to admission as evidenced by presenting symptoms. On initial examination, patient is irritable, restless. Please see plan below for detailed recommendations.   Diagnoses:  Active Hospital problems: Active Problems:   Schizoaffective disorder (HCC)   Polysubstance abuse (HCC)    Plan   ## Psychiatric Medication Recommendations:  Inpatient recommended for stabilization  ## Medical Decision Making Capacity: Not specifically addressed in this encounter  ## Further Work-up:  -- To be determined UDS -- most recent EKG : NA -- Pertinent labwork reviewed earlier this admission includes: ED labs reviewed   ## Disposition:-- We recommend inpatient psychiatric hospitalization when medically cleared. Patient is under voluntary admission status at this time; please IVC if attempts to leave hospital.  ## Behavioral / Environmental: - No specific recommendations at this time.     ## Safety and Observation Level:  - Based on my clinical evaluation, I estimate the patient to be at moderate risk of self harm in the current setting. - At this time, we recommend  routine. This decision is based on my review of the chart including patient's history and current presentation, interview of the patient, mental status examination, and consideration of suicide risk including evaluating suicidal ideation, plan, intent, suicidal or self-harm behaviors, risk factors, and protective factors. This judgment is based on our ability to directly address suicide risk, implement suicide prevention strategies, and develop a safety plan while the patient is in the clinical setting. Please contact our team if there is a concern that risk level has changed.  CSSR Risk Category:C-SSRS RISK CATEGORY: Error: Q7 should not be  populated when Q6 is No  Suicide Risk Assessment: Patient has following modifiable risk factors for suicide: active mental illness (to encompass adhd, tbi, mania, psychosis, trauma  reaction) and current symptoms: anxiety/panic, insomnia, impulsivity, anhedonia, hopelessness, which we are addressing by recommending inpatient treatment. Patient has following non-modifiable or demographic risk factors for suicide: male gender and psychiatric hospitalization Patient has the following protective factors against suicide: no history of suicide attempts  Thank you for this consult request. Recommendations have been communicated to the primary team.  We will recommend inpatient treatment at this time.   Randall Bouquet, NP       History of Present Illness  Relevant Aspects of Hospital ED Course:  Admitted on 12/03/2023 for depression, suicidal/homicidal ideations.   Patient Report:  Patient patient is guarded and irritable upon approach. He reports that he is here because his family is not being fair with him. Patient was recently released from prison and currently homeless. States his family would let him come home to get my medications. He reports that his father would not let him come home to gather his clothes and medications. He is paranoid, and easily angered. He reports feeling helpless and hopeless with passive SI. Reports feeling angry with his family especially his father.  He states he is getting more and more suicidal  as his family is trying to screw him. Feels like he is not being treated fair by his family. Patient reports no additional stressor but living on the street and not being able to take his medications which are stored at his father's house.   Per chart review, patient has had prior multiple visits to this system and elsewhere, including [facility]. Zyprexa  is his reported most recent antipsychotic. During this evaluation, patient sound to be experiencing disorganized thinking as he jumps from one subject to another, angry and blaming his family. He is guarded and paranoid, reporting that he does not trust his family. He is talking about putting hands on his  father but not completing his sentences. He then becomes more and more agitated.  He denies hallucinations but does appear to be influenced by internal stimuli. He does admit to not taking his prescribed medications due to his living conditions.   Psych ROS:  Depression: Reported Anxiety:  Reported, noted Mania (lifetime and current): NA Psychosis: (lifetime and current): Reported  Collateral information:  Contacted : NA  Review of Systems  Constitutional: Negative.   HENT: Negative.    Eyes: Negative.   Respiratory: Negative.    Cardiovascular: Negative.   Gastrointestinal: Negative.   Genitourinary: Negative.   Musculoskeletal: Negative.   Skin: Negative.   Neurological: Negative.   Endo/Heme/Allergies: Negative.   Psychiatric/Behavioral:  Positive for depression and substance abuse. The patient is nervous/anxious.      Psychiatric and Social History  Psychiatric History:  Information collected from Patient, chart, nursing  Prev Dx/Sx: Schizophrenia Current Psych Provider: NA Home Meds (current): Zyprexa , Hydroxyzine  Previous Med Trials: NA Therapy: NA  Prior Psych Hospitalization: Reported. Last treatment July 2025 at [facility]  Prior Self Harm: Not reported Prior Violence: Reported  Family Psych History: patient refused to discuss about family Family Hx suicide: NA  Social History:  Developmental Hx: na Educational Hx: na Occupational Hx: na Legal Hx: na Living Situation: Homeless Spiritual Hx: NA Access to weapons/lethal means: na   Substance History Alcohol: Reported  Type of alcohol : Not discussed Last Drink: NA Number of drinks per day : NA History of alcohol withdrawal seizures :  NA History of DT's : NA Tobacco: Reported Illicit drugs: reported Prescription drug abuse: NA Rehab hx: NA  Exam Findings  Physical Exam:  Vital Signs:  Temp:  [99.2 F (37.3 C)] 99.2 F (37.3 C) (10/27 1251) Pulse Rate:  [73] 73 (10/27 1251) Resp:  [17] 17  (10/27 1251) BP: (150)/(95) 150/95 (10/27 1251) SpO2:  [97 %] 97 % (10/27 1251) Weight:  [97.5 kg] 97.5 kg (10/27 1251) Blood pressure (!) 150/95, pulse 73, temperature 99.2 F (37.3 C), temperature source Oral, resp. rate 17, height 5' 9 (1.753 m), weight 97.5 kg, SpO2 97%. Body mass index is 31.75 kg/m.  Physical Exam Vitals and nursing note reviewed.  HENT:     Head: Normocephalic and atraumatic.     Right Ear: Tympanic membrane normal.     Nose: Nose normal.  Eyes:     Extraocular Movements: Extraocular movements intact.     Pupils: Pupils are equal, round, and reactive to light.  Cardiovascular:     Rate and Rhythm: Normal rate.     Pulses: Normal pulses.  Pulmonary:     Effort: Pulmonary effort is normal.  Musculoskeletal:        General: Normal range of motion.     Cervical back: Normal range of motion.  Neurological:     General: No focal deficit present.     Mental Status: He is oriented to person, place, and time.     Mental Status Exam: General Appearance: Disheveled  Orientation:  Full (Time, Place, and Person)  Memory:  Immediate;   Fair Recent;   Fair Remote;   Fair  Concentration:  Concentration: Poor and Attention Span: Poor  Recall:  Fair  Attention  Poor  Eye Contact:  Minimal  Speech:  Pressured  Language:  Fair  Volume:  Increased  Mood: Angry, agitated  Affect:  Depressed  Thought Process:  Irrelevant  Thought Content:  Illogical and Paranoid Ideation  Suicidal Thoughts:  Yes.  without intent/plan  Homicidal Thoughts:  Yes.  without intent/plan  Judgement:  Poor  Insight:  Fair  Psychomotor Activity:  Restlessness  Akathisia:  NA  Fund of Knowledge:  Fair      Assets:  Manufacturing Systems Engineer Desire for Improvement  Cognition:  WNL  ADL's:  Intact  AIMS (if indicated):   NA     Other History   These have been pulled in through the EMR, reviewed, and updated if appropriate.  Family History:  The patient's family history includes  COPD in his mother; Cancer in his father and another family member; Hypertension in his father.  Medical History: Past Medical History:  Diagnosis Date  . Arthritis   . Asthma   . Bipolar 1 disorder (HCC)   . Chronic back pain   . Depression   . Schizophrenia, paranoid type (HCC)   . Seasonal allergies     Surgical History: History reviewed. No pertinent surgical history.   Medications:   Current Facility-Administered Medications:  .  albuterol  (VENTOLIN  HFA) 108 (90 Base) MCG/ACT inhaler 1-2 puff, 1-2 puff, Inhalation, Q4H PRN, Garrick Charleston, MD .  carvedilol  (COREG ) tablet 3.125 mg, 3.125 mg, Oral, BID WC, Garrick Charleston, MD .  hydrOXYzine  (ATARAX ) tablet 25 mg, 25 mg, Oral, TID PRN, Garrick Charleston, MD .  risperiDONE  (RISPERDAL  M-TABS) disintegrating tablet 2 mg, 2 mg, Oral, Q8H PRN **AND** LORazepam  (ATIVAN ) tablet 1 mg, 1 mg, Oral, PRN **AND** ziprasidone  (GEODON ) injection 20 mg, 20 mg, Intramuscular, PRN, Garrick Charleston, MD .  OLANZapine  (ZYPREXA ) tablet 10 mg, 10 mg, Oral, QHS, Garrick Charleston, MD  Current Outpatient Medications:  .  albuterol  (VENTOLIN  HFA) 108 (90 Base) MCG/ACT inhaler, Inhale 1-2 puffs into the lungs every 4 (four) hours as needed for wheezing or shortness of breath., Disp: 17 g, Rfl: 0 .  atorvastatin  (LIPITOR) 20 MG tablet, Take 20 mg by mouth See admin instructions. Take it on Sunday, Tuesday, Wednesday, Thursday, and Saturday as needed for high cholesterol., Disp: , Rfl:  .  budesonide -glycopyrrolate -formoterol  (BREZTRI ) 160-9-4.8 MCG/ACT AERO inhaler, Inhale 2 puffs into the lungs 2 (two) times daily., Disp: 10.7 g, Rfl: 0 .  carvedilol  (COREG ) 3.125 MG tablet, Take 1 tablet (3.125 mg total) by mouth 2 (two) times daily with a meal., Disp: 60 tablet, Rfl: 0 .  Coenzyme Q10 (COQ10 PO), Take 1 tablet by mouth 3 (three) times a week. Monday, Wednesday, and Friday, Disp: , Rfl:  .  hydrOXYzine  (ATARAX ) 25 MG tablet, Take 1 tablet (25 mg total) by  mouth 3 (three) times daily as needed for anxiety., Disp: 30 tablet, Rfl: 0 .  loratadine  (CLARITIN ) 10 MG tablet, Take 1 tablet (10 mg total) by mouth daily as needed for allergies., Disp: 30 tablet, Rfl: 0 .  OLANZapine  (ZYPREXA ) 10 MG tablet, Take 1 tablet (10 mg total) by mouth at bedtime., Disp: 30 tablet, Rfl: 0 .  rosuvastatin  (CRESTOR ) 10 MG tablet, Take 10 mg by mouth 2 (two) times a week. Monday and Friday, Disp: , Rfl:  .  thiamine (VITAMIN B1) 100 MG tablet, Take 100 mg by mouth daily., Disp: , Rfl:  .  zinc gluconate 50 MG tablet, Take 50 mg by mouth daily., Disp: , Rfl:   Allergies: Allergies  Allergen Reactions  . Cat Dander Itching and Other (See Comments)    Sneezing    . Dog Epithelium (Canis Lupus Familiaris) Itching and Other (See Comments)    Sneezing     Randall Bouquet, NP

## 2023-12-03 NOTE — ED Notes (Signed)
 This RN spoke with [place] @ [facility] at this time.

## 2023-12-03 NOTE — ED Notes (Signed)
 ED Provider at bedside.  Pt getting verbally loud and agitated. Pt making statements of being a scape goat and being accused of things he did not do.  Pt reports he wishes for someone to try to put their hands on him. He reports he is gonna snap.

## 2023-12-03 NOTE — ED Notes (Signed)
 Patient requested his bible and glasses from locker. Retrieved for patient.

## 2023-12-03 NOTE — ED Triage Notes (Signed)
 Pt arrived via POV c/o homelessness and having SI/HI. Pt denies AVH. Pt presents agitated in Triage and reports being out of prison for 1 year now.

## 2023-12-03 NOTE — Progress Notes (Signed)
 Pt has been accepted to [facility] on 12/03/2023 . Unit assignment:Unit 500  Pt meets inpatient criteria per Starlyn Patron, NP.   Attending Physician will be Dr. Alm Calender   Report can be called to: [phone]  Pt can arrive after asap  Care Team Notified: Bernice Blood, RN

## 2023-12-03 NOTE — Progress Notes (Signed)
 Inpatient Psychiatric Referral  Patient was recommended inpatient per Starlyn Patron, NP. There are no available beds at [facility], per [facility] . Patient was referred to the following out of network facilities:  Destination  Service Provider Address Phone Fax  [facility]  [address] [place] [address] [phone]  [facility]  [address], [place] [address] [phone]  [facility]  [address], [place] [address] [phone]  [facility]  [address] [place] [address] [phone]  [facility] EFAX  [address] [place], [place] [phone]  [facility]  [address], [place] [address] [phone]  [facility]  [address] [place] [address] [phone]    Situation ongoing, CSW to continue following and update chart as more information becomes available.   Harrie Sofia MSW, LCSWA 12/03/2023  8:09PM

## 2023-12-04 DIAGNOSIS — R456 Violent behavior: Secondary | ICD-10-CM | POA: Diagnosis not present

## 2023-12-04 NOTE — ED Provider Notes (Signed)
 Emergency Medicine Observation Re-evaluation Note  Wesley Beck is a 53 y.o. male, seen on rounds today.  Pt initially presented to the ED for complaints of Medical Clearance Currently, the patient is sleeping.  Physical Exam  BP (!) 140/79 (BP Location: Left Arm)   Pulse 72   Temp 98.3 F (36.8 C) (Oral)   Resp 18   Ht 5' 9 (1.753 m)   Wt 97.5 kg   SpO2 100%   BMI 31.75 kg/m  Physical Exam General: Sleeping Cardiac: Extremities well-perfused Lungs: Breathing is unlabored Psych: Deferred  ED Course / MDM  EKG:   I have reviewed the labs performed to date as well as medications administered while in observation.  Recent changes in the last 24 hours include presentation to the urgency department yesterday with SI and HI.  He has been medically cleared.  He was evaluated by TTS who recommends inpatient psychiatric admission.  He has been accepted to [facility].  Accepting physician is Dr. Jackee.  Plan  Current plan is for inpatient psychiatric admission.    Melvenia Motto, MD 12/04/23 [phone]

## 2023-12-04 NOTE — ED Notes (Addendum)
 Attempted to call [facility] to give report. [phone]  1 time spoke with house supervisor, and placed on hold for 10 minutes.  Left voicemail other two times.  If noone answers then house supervisor number [phone]

## 2023-12-27 ENCOUNTER — Other Ambulatory Visit: Payer: Self-pay

## 2023-12-27 ENCOUNTER — Emergency Department
Admission: EM | Admit: 2023-12-27 | Discharge: 2023-12-28 | Disposition: A | Discharge status: Home / Self Care | Attending: Emergency Medicine | Admitting: Emergency Medicine

## 2023-12-27 DIAGNOSIS — Z760 Encounter for issue of repeat prescription: Secondary | ICD-10-CM | POA: Diagnosis present

## 2023-12-27 DIAGNOSIS — F319 Bipolar disorder, unspecified: Secondary | ICD-10-CM | POA: Diagnosis present

## 2023-12-27 DIAGNOSIS — Z5900 Homelessness unspecified: Secondary | ICD-10-CM | POA: Diagnosis not present

## 2023-12-27 DIAGNOSIS — Z79899 Other long term (current) drug therapy: Secondary | ICD-10-CM | POA: Diagnosis not present

## 2023-12-27 DIAGNOSIS — M25562 Pain in left knee: Secondary | ICD-10-CM | POA: Diagnosis not present

## 2023-12-27 LAB — COMPREHENSIVE METABOLIC PANEL WITH GFR
ALT: 33 U/L (ref 0–44)
AST: 37 U/L (ref 15–41)
Albumin: 4.1 g/dL (ref 3.5–5.0)
Alkaline Phosphatase: 101 U/L (ref 38–126)
Anion gap: 11 (ref 5–15)
BUN: 16 mg/dL (ref 6–20)
CO2: 29 mmol/L (ref 22–32)
Calcium: 9.2 mg/dL (ref 8.9–10.3)
Chloride: 102 mmol/L (ref 98–111)
Creatinine, Ser: 0.96 mg/dL (ref 0.61–1.24)
GFR, Estimated: >60 mL/min (ref >60)
Glucose, Bld: 96 mg/dL (ref 70–99)
Potassium: 3.8 mmol/L (ref 3.5–5.1)
Sodium: 142 mmol/L (ref 135–145)
Total Bilirubin: 0.3 mg/dL (ref 0.0–1.2)
Total Protein: 7.3 g/dL (ref 6.5–8.1)

## 2023-12-27 LAB — CBC
HCT: 39.3 % (ref 39.0–52.0)
Hemoglobin: 13.5 g/dL (ref 13.0–17.0)
MCH: 31.9 pg (ref 26.0–34.0)
MCHC: 34.4 g/dL (ref 30.0–36.0)
MCV: 92.9 fL (ref 80.0–100.0)
Platelets: 483 K/uL — ABNORMAL HIGH (ref 150–400)
RBC: 4.23 MIL/uL (ref 4.22–5.81)
RDW: 12.2 % (ref 11.5–15.5)
WBC: 7.1 K/uL (ref 4.0–10.5)
nRBC: 0 % (ref 0.0–0.2)

## 2023-12-27 LAB — ETHANOL: Alcohol, Ethyl (B): <15 mg/dL (ref <15)

## 2023-12-27 MED ORDER — LORATADINE 10 MG PO TABS: 10.0000 mg | ORAL_TABLET | Freq: Every day | ORAL | Status: DC | PRN

## 2023-12-27 MED ORDER — OLANZAPINE 10 MG PO TABS
10.0000 mg | ORAL_TABLET | Freq: Every day | ORAL | Status: DC
  Administered 2023-12-27: 10 mg via ORAL
  Filled 2023-12-27: qty 1

## 2023-12-27 MED ORDER — NAPROXEN 250 MG PO TABS
375.0000 mg | ORAL_TABLET | Freq: Three times a day (TID) | ORAL | Status: DC
  Administered 2023-12-28: 375 mg via ORAL
  Filled 2023-12-27: qty 2
  Filled 2023-12-27: qty 1
  Filled 2023-12-27: qty 2

## 2023-12-27 MED ORDER — IBUPROFEN 600 MG PO TABS: 600.0000 mg | ORAL_TABLET | Freq: Three times a day (TID) | ORAL | Status: DC | PRN

## 2023-12-27 MED ORDER — THIAMINE HCL 100 MG PO TABS
100.0000 mg | ORAL_TABLET | Freq: Every day | ORAL | Status: DC
  Administered 2023-12-28: 100 mg via ORAL
  Filled 2023-12-27 (×2): qty 1

## 2023-12-27 MED ORDER — ONDANSETRON HCL 4 MG PO TABS: 4.0000 mg | ORAL_TABLET | Freq: Three times a day (TID) | ORAL | Status: DC | PRN

## 2023-12-27 MED ORDER — BUDESON-GLYCOPYRROL-FORMOTEROL 160-9-4.8 MCG/ACT IN AERO: 2.0000 | INHALATION_SPRAY | Freq: Two times a day (BID) | RESPIRATORY_TRACT | Status: DC

## 2023-12-27 MED ORDER — ALUM & MAG HYDROXIDE-SIMETH 200-200-20 MG/5ML PO SUSP: 30.0000 mL | Freq: Four times a day (QID) | ORAL | Status: DC | PRN

## 2023-12-27 MED ORDER — HYDROXYZINE HCL 25 MG PO TABS: 25.0000 mg | ORAL_TABLET | Freq: Three times a day (TID) | ORAL | Status: DC | PRN

## 2023-12-27 MED ORDER — ATORVASTATIN CALCIUM 20 MG PO TABS: 20.0000 mg | ORAL_TABLET | ORAL | Status: DC

## 2023-12-27 MED ORDER — ALBUTEROL SULFATE HFA 108 (90 BASE) MCG/ACT IN AERS: 1.0000 | INHALATION_SPRAY | RESPIRATORY_TRACT | Status: DC | PRN

## 2023-12-27 MED ORDER — CARVEDILOL 6.25 MG PO TABS
3.1250 mg | ORAL_TABLET | Freq: Two times a day (BID) | ORAL | Status: DC
  Administered 2023-12-27 – 2023-12-28 (×2): 3.125 mg via ORAL
  Filled 2023-12-27 (×2): qty 1

## 2023-12-27 MED ORDER — ROSUVASTATIN CALCIUM 10 MG PO TABS
10.0000 mg | ORAL_TABLET | ORAL | Status: DC
  Administered 2023-12-28: 10 mg via ORAL
  Filled 2023-12-27: qty 1

## 2023-12-27 MED ORDER — MELATONIN 5 MG PO TABS
5.0000 mg | ORAL_TABLET | Freq: Every day | ORAL | Status: DC
  Administered 2023-12-27: 5 mg via ORAL
  Filled 2023-12-27: qty 1

## 2023-12-27 MED ORDER — TIOTROPIUM BROMIDE 18 MCG IN CAPS: 1.0000 | ORAL_CAPSULE | Freq: Every day | RESPIRATORY_TRACT | Status: DC

## 2023-12-27 MED ORDER — BUDESON-GLYCOPYRROL-FORMOTEROL 160-9-4.8 MCG/ACT IN AERO
2.0000 | INHALATION_SPRAY | Freq: Two times a day (BID) | RESPIRATORY_TRACT | Status: DC
  Administered 2023-12-27 – 2023-12-28 (×2): 2 via RESPIRATORY_TRACT
  Filled 2023-12-27: qty 5.9

## 2023-12-27 NOTE — ED Triage Notes (Addendum)
 Pt states he does not have his medication, states he has been walking for two days and has not slept for two days. Pt denies SI/HI, AVH. Pt states he wants his Bibles, photo album, clothes, and pills that are at the [facility].
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
## 2023-12-27 NOTE — ED Notes (Signed)
 Moved to [facility] 3 pending consult

## 2023-12-27 NOTE — ED Provider Notes (Signed)
 [facility] Provider Note    Event Date/Time   First MD Initiated Contact with Patient 12/27/23 1557     (approximate)   History   Chief Complaint: Psychiatric Evaluation   HPI  Wesley Beck is a 53 y.o. male with a history of bipolar disorder, schizoaffective, polysubstance abuse who comes ED complaining of not having anywhere to sleep so he has been walking for the past 2 days and awake this whole time.  Reports that he does not have any of his medication because it was all left at the [facility] rescue mission.  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  He complains that they discriminate against none Christians.  Denies hallucinations but states I wish they would come back.  Does feel suicidal.  No HI.     Physical Exam   Triage Vital Signs: ED Triage Vitals  Encounter Vitals Group     BP 12/27/23 1532 (!) 157/96     Girls Systolic BP Percentile --      Girls Diastolic BP Percentile --      Boys Systolic BP Percentile --      Boys Diastolic BP Percentile --      Pulse Rate 12/27/23 1532 79     Resp 12/27/23 1532 18     Temp 12/27/23 1532 98.3 F (36.8 C)     Temp Source 12/27/23 1532 Oral     SpO2 12/27/23 1532 96 %     Weight 12/27/23 1535 212 lb (96.2 kg)     Height 12/27/23 1535 5' 9 (1.753 m)     Head Circumference --      Peak Flow --      Pain Score 12/27/23 1533 0     Pain Loc --      Pain Education --      Exclude from Growth Chart --     Most recent vital signs: Vitals:   12/27/23 1532 12/27/23 1900  BP: (!) 157/96 117/75  Pulse: 79 81  Resp: 18 16  Temp: 98.3 F (36.8 C) 98.4 F (36.9 C)  SpO2: 96% 96%    General: Awake, no distress. CV:  Good peripheral perfusion.  Resp:  Normal effort.  Abd:  No distention.  Other:  No wounds   ED Results / Procedures / Treatments   Labs (all labs ordered are listed, but only abnormal results are displayed) Labs  Reviewed  CBC - Abnormal; Notable for the following components:      Result Value   Platelets 483 (*)    All other components within normal limits  COMPREHENSIVE METABOLIC PANEL WITH GFR  ETHANOL  URINE DRUG SCREEN     EKG    RADIOLOGY    PROCEDURES:  Procedures   MEDICATIONS ORDERED IN ED: Medications  atorvastatin  (LIPITOR) tablet 20 mg (has no administration in time range)  carvedilol  (COREG ) tablet 3.125 mg (has no administration in time range)  hydrOXYzine  (ATARAX ) tablet 25 mg (has no administration in time range)  melatonin tablet 5 mg (has no administration in time range)  naproxen (NAPROSYN) tablet 375 mg (has no administration in time range)  OLANZapine  (ZYPREXA ) tablet 10 mg (has no administration in time range)  budesonide -glycopyrrolate -formoterol  (BREZTRI ) 160-9-4.8 MCG/ACT inhaler 2 puff (has no administration in time range)  rosuvastatin  (CRESTOR ) tablet 10 mg (has no administration in time range)  ibuprofen  (ADVIL ) tablet 600 mg (has no administration in  time range)  ondansetron  (ZOFRAN ) tablet 4 mg (has no administration in time range)  alum & mag hydroxide-simeth (MAALOX/MYLANTA) 200-200-20 MG/5ML suspension 30 mL (has no administration in time range)     IMPRESSION / MDM / ASSESSMENT AND PLAN / ED COURSE  I reviewed the triage vital signs and the nursing notes.  Patient's presentation is most consistent with severe exacerbation of chronic illness.  Patient presents with SI, worsening agitation in the setting of being out of his medications for bipolar/schizoaffective.  Will consult psychiatry.  He is medically stable.  The patient has been placed in psychiatric observation due to the need to provide a safe environment for the patient while obtaining psychiatric consultation and evaluation, as well as ongoing medical and medication management to treat the patient's condition.  The patient has not been placed under full IVC at this time.       FINAL CLINICAL IMPRESSION(S) / ED DIAGNOSES   Final diagnoses:  Bipolar affective disorder, remission status unspecified (HCC)     Rx / DC Orders   ED Discharge Orders     None        Note:  This document was prepared using Dragon voice recognition software and may include unintentional dictation errors.   Viviann Pastor, MD 12/27/23 [phone]

## 2023-12-27 NOTE — BH Assessment (Signed)
 This Clinical research associate contacted [facility] via phone to request an assessment for this patient, request has been made, assessment is currently pending

## 2023-12-27 NOTE — ED Notes (Signed)
 Dinner tray given to pt

## 2023-12-27 NOTE — ED Notes (Addendum)
 Pt dressed out in hospital scrubs. Pt belongings include:  Suspenders Tan pants Tan plaid shirt Brown sandals Black socks Black coat Camo hat 2 inhalers, keys, notepad Bracelet

## 2023-12-28 ENCOUNTER — Other Ambulatory Visit: Payer: Self-pay

## 2023-12-28 DIAGNOSIS — Z79899 Other long term (current) drug therapy: Secondary | ICD-10-CM | POA: Insufficient documentation

## 2023-12-28 DIAGNOSIS — F319 Bipolar disorder, unspecified: Secondary | ICD-10-CM | POA: Diagnosis not present

## 2023-12-28 DIAGNOSIS — Z760 Encounter for issue of repeat prescription: Secondary | ICD-10-CM | POA: Insufficient documentation

## 2023-12-28 DIAGNOSIS — Z5900 Homelessness unspecified: Secondary | ICD-10-CM | POA: Insufficient documentation

## 2023-12-28 LAB — CBC
HCT: 39.8 % (ref 39.0–52.0)
Hemoglobin: 13.3 g/dL (ref 13.0–17.0)
MCH: 31.9 pg (ref 26.0–34.0)
MCHC: 33.4 g/dL (ref 30.0–36.0)
MCV: 95.4 fL (ref 80.0–100.0)
Platelets: 497 K/uL — ABNORMAL HIGH (ref 150–400)
RBC: 4.17 MIL/uL — ABNORMAL LOW (ref 4.22–5.81)
RDW: 12.2 % (ref 11.5–15.5)
WBC: 11.9 K/uL — ABNORMAL HIGH (ref 4.0–10.5)
nRBC: 0 % (ref 0.0–0.2)

## 2023-12-28 LAB — COMPREHENSIVE METABOLIC PANEL WITH GFR
ALT: 35 U/L (ref 0–44)
AST: 36 U/L (ref 15–41)
Albumin: 4 g/dL (ref 3.5–5.0)
Alkaline Phosphatase: 92 U/L (ref 38–126)
Anion gap: 11 (ref 5–15)
BUN: 20 mg/dL (ref 6–20)
CO2: 29 mmol/L (ref 22–32)
Calcium: 9.3 mg/dL (ref 8.9–10.3)
Chloride: 101 mmol/L (ref 98–111)
Creatinine, Ser: 1.22 mg/dL (ref 0.61–1.24)
GFR, Estimated: >60 mL/min (ref >60)
Glucose, Bld: 131 mg/dL — ABNORMAL HIGH (ref 70–99)
Potassium: 4.3 mmol/L (ref 3.5–5.1)
Sodium: 141 mmol/L (ref 135–145)
Total Bilirubin: 0.2 mg/dL (ref 0.0–1.2)
Total Protein: 6.7 g/dL (ref 6.5–8.1)

## 2023-12-28 LAB — ETHANOL: Alcohol, Ethyl (B): <15 mg/dL (ref <15)

## 2023-12-28 NOTE — ED Notes (Signed)
 Pt provided with lunch tray and beverage

## 2023-12-28 NOTE — ED Triage Notes (Signed)
 Pt presents requesting to speak to psychiatry. Seen this morning and discharged. Pt states he came back because I don't have anywhere else to go and don't have medications. Denies SI/HI/AH/VH.

## 2023-12-28 NOTE — ED Notes (Signed)
 Breakfast tray provided at this time

## 2023-12-28 NOTE — ED Notes (Signed)
vol/pending consult.. 

## 2023-12-28 NOTE — ED Provider Notes (Signed)
 Emergency Medicine Observation Re-evaluation Note  Wesley Beck is a 53 y.o. male, seen on rounds today.  Pt initially presented to the ED for complaints of Psychiatric Evaluation  Currently, the patient is resting comfortably.  Physical Exam  BP 117/75 (BP Location: Left Arm)   Pulse 81   Temp 98.4 F (36.9 C) (Oral)   Resp 16   Ht 5' 9 (1.753 m)   Wt 96.2 kg   SpO2 96%   BMI 31.31 kg/m  General: No acute distress Cardiac: Well-perfused extremities Lungs: No respiratory distress Psych: Appropriate mood and affect  ED Course / MDM  EKG:   I have reviewed the labs performed to date as well as medications administered while in observation.  Recent changes in the last 24 hours include none.  Plan  Current plan is for placement.   Marien Manship K, MD 12/28/23 1001

## 2023-12-29 ENCOUNTER — Emergency Department
Admission: EM | Admit: 2023-12-29 | Discharge: 2023-12-29 | Disposition: A | Discharge status: Home / Self Care | Attending: Emergency Medicine | Admitting: Emergency Medicine

## 2023-12-29 ENCOUNTER — Emergency Department

## 2023-12-29 ENCOUNTER — Other Ambulatory Visit: Payer: Self-pay

## 2023-12-29 DIAGNOSIS — Z760 Encounter for issue of repeat prescription: Secondary | ICD-10-CM | POA: Diagnosis not present

## 2023-12-29 DIAGNOSIS — Z5900 Homelessness unspecified: Secondary | ICD-10-CM | POA: Insufficient documentation

## 2023-12-29 DIAGNOSIS — W010XXA Fall on same level from slipping, tripping and stumbling without subsequent striking against object, initial encounter: Secondary | ICD-10-CM | POA: Insufficient documentation

## 2023-12-29 DIAGNOSIS — Z765 Malingerer [conscious simulation]: Secondary | ICD-10-CM | POA: Diagnosis not present

## 2023-12-29 DIAGNOSIS — M25562 Pain in left knee: Secondary | ICD-10-CM | POA: Diagnosis not present

## 2023-12-29 DIAGNOSIS — F319 Bipolar disorder, unspecified: Secondary | ICD-10-CM | POA: Diagnosis not present

## 2023-12-29 LAB — URINE DRUG SCREEN
Amphetamines: NEGATIVE
Barbiturates: NEGATIVE
Benzodiazepines: NEGATIVE
Cocaine: NEGATIVE
Fentanyl: NEGATIVE
Methadone Scn, Ur: NEGATIVE
Opiates: NEGATIVE
Tetrahydrocannabinol: POSITIVE — AB

## 2023-12-29 MED ORDER — OLANZAPINE 10 MG PO TABS
10.0000 mg | ORAL_TABLET | Freq: Every day | ORAL | 3 refills | Status: DC
  Filled 2023-12-29: qty 90, 90d supply, fill #0

## 2023-12-29 MED ORDER — OLANZAPINE 5 MG PO TBDP
10.0000 mg | ORAL_TABLET | Freq: Every day | ORAL | Status: DC
  Administered 2023-12-29: 10 mg via ORAL
  Filled 2023-12-29: qty 2

## 2023-12-29 NOTE — ED Triage Notes (Addendum)
 Pt presents for homelessness and medication refill. Pt states he came in because he has no where else to go but in route to the ED, stepped in a hole and now endorsing left knee pain. Able to ambulate without difficulty. Denies SI/HI/AH/VH. Endorsing smoking an herbal cigarette. Pt states he has run out of all his medications

## 2023-12-29 NOTE — ED Provider Notes (Signed)
 [facility] Provider Note    Event Date/Time   First MD Initiated Contact with Patient 12/29/23 [phone]     (approximate)   History   Homeless (/)   HPI  Wesley Beck is a 53 y.o. male   Past medical history of homelessness, bipolar, chronic pain and schizophrenia presents the emergency department for medication refill.  Was prescribed olanzapine  but was unable to fill his prescription once his nighttime dose.  He also complains that he is homeless.  He was seen by psychiatry and discharged this morning.  He reports no other acute medical complaints and denies SI HI or AVH.    External Medical Documents Reviewed:  Prior psychiatry notes      Physical Exam   Triage Vital Signs: ED Triage Vitals  Encounter Vitals Group     BP 12/28/23 2325 137/84     Girls Systolic BP Percentile --      Girls Diastolic BP Percentile --      Boys Systolic BP Percentile --      Boys Diastolic BP Percentile --      Pulse Rate 12/28/23 2324 79     Resp 12/28/23 2324 18     Temp 12/28/23 2324 98 F (36.7 C)     Temp Source 12/28/23 2324 Oral     SpO2 --      Weight 12/28/23 2325 212 lb (96.2 kg)     Height 12/28/23 2325 5' 9 (1.753 m)     Head Circumference --      Peak Flow --      Pain Score --      Pain Loc --      Pain Education --      Exclude from Growth Chart --     Most recent vital signs: Vitals:   12/28/23 2324 12/28/23 2325  BP:  137/84  Pulse: 79   Resp: 18   Temp: 98 F (36.7 C)     General: Awake, no distress.  CV:  Good peripheral perfusion.  Resp:  Normal effort.  Abd:  No distention.  Other:  No acute distress, normal vital signs, resting comfortably, pleasant and appropriate.   ED Results / Procedures / Treatments   Labs (all labs ordered are listed, but only abnormal results are displayed) Labs Reviewed  COMPREHENSIVE METABOLIC PANEL WITH GFR - Abnormal; Notable for the following components:      Result Value    Glucose, Bld 131 (*)    All other components within normal limits  CBC - Abnormal; Notable for the following components:   WBC 11.9 (*)    RBC 4.17 (*)    Platelets 497 (*)    All other components within normal limits  URINE DRUG SCREEN - Abnormal; Notable for the following components:   Tetrahydrocannabinol POSITIVE (*)    All other components within normal limits  ETHANOL     I ordered and reviewed the above labs they are notable for cell counts electrolytes unremarkable.  THC positive in urine drug screen.  PROCEDURES:  Critical Care performed: No  Procedures   MEDICATIONS ORDERED IN ED: Medications  OLANZapine  zydis (ZYPREXA ) disintegrating tablet 10 mg (10 mg Oral Given 12/29/23 0133)    IMPRESSION / MDM / ASSESSMENT AND PLAN / ED COURSE  I reviewed the triage vital signs and the nursing notes.  Patient's presentation is most consistent with acute, uncomplicated illness.  Differential diagnosis includes, but is not limited to, homelessness, medication refill   MDM: Medication given and a refill sent to our hospital pharmacy per patient request.  No other acute medical complaints does not meet IVC criteria, no psychiatric complaints, already given resources for homelessness prior visits.  Discharge.        FINAL CLINICAL IMPRESSION(S) / ED DIAGNOSES   Final diagnoses:  Encounter for medication refill     Rx / DC Orders   ED Discharge Orders          Ordered    OLANZapine  (ZYPREXA ) 10 MG tablet  Daily at bedtime        12/29/23 0130             Note:  This document was prepared using Dragon voice recognition software and may include unintentional dictation errors.    Cyrena Mylar, MD 12/29/23 [phone]

## 2023-12-29 NOTE — ED Notes (Signed)
 Unable to locate patient at this time for room placement.

## 2023-12-29 NOTE — Discharge Instructions (Signed)
 Your olanzapine  prescription was sent to the [facility] at [facility] located at [address] in [place], [place] [address].  The pharmacy opens at 8 AM on Saturday.

## 2023-12-30 ENCOUNTER — Other Ambulatory Visit: Payer: Self-pay

## 2023-12-30 ENCOUNTER — Emergency Department
Admission: EM | Admit: 2023-12-30 | Discharge: 2023-12-30 | Disposition: A | Discharge status: Home / Self Care | Attending: Emergency Medicine | Admitting: Emergency Medicine

## 2023-12-30 DIAGNOSIS — Z5900 Homelessness unspecified: Secondary | ICD-10-CM

## 2023-12-30 DIAGNOSIS — Z765 Malingerer [conscious simulation]: Secondary | ICD-10-CM

## 2023-12-30 DIAGNOSIS — Z760 Encounter for issue of repeat prescription: Secondary | ICD-10-CM

## 2023-12-30 MED ORDER — LORATADINE 10 MG PO TABS
10.0000 mg | ORAL_TABLET | Freq: Every day | ORAL | 0 refills | Status: AC | PRN
  Filled 2023-12-30 (×2): qty 30, 30d supply, fill #0

## 2023-12-30 MED ORDER — HYDROXYZINE HCL 25 MG PO TABS
25.0000 mg | ORAL_TABLET | Freq: Three times a day (TID) | ORAL | 0 refills | Status: DC | PRN
  Filled 2023-12-30 (×2): qty 30, 10d supply, fill #0

## 2023-12-30 MED ORDER — ALBUTEROL SULFATE HFA 108 (90 BASE) MCG/ACT IN AERS
1.0000 | INHALATION_SPRAY | RESPIRATORY_TRACT | 1 refills | Status: AC | PRN
  Filled 2023-12-30: qty 6.7, 30d supply, fill #0

## 2023-12-30 MED ORDER — BUDESON-GLYCOPYRROL-FORMOTEROL 160-9-4.8 MCG/ACT IN AERO
2.0000 | INHALATION_SPRAY | Freq: Two times a day (BID) | RESPIRATORY_TRACT | 0 refills | Status: DC
  Filled 2023-12-30 (×2): qty 10.7, 30d supply, fill #0

## 2023-12-30 MED ORDER — ATORVASTATIN CALCIUM 20 MG PO TABS
20.0000 mg | ORAL_TABLET | ORAL | 0 refills | Status: DC
  Filled 2023-12-30 (×3): qty 30, 42d supply, fill #0

## 2023-12-30 MED ORDER — CARVEDILOL 3.125 MG PO TABS
3.1250 mg | ORAL_TABLET | Freq: Two times a day (BID) | ORAL | 2 refills | Status: DC
  Filled 2023-12-30 (×2): qty 60, 30d supply, fill #0

## 2023-12-30 NOTE — ED Provider Notes (Signed)
 [facility] Provider Note    Event Date/Time   First MD Initiated Contact with Patient 12/30/23 0114     (approximate)   History   Homeless (/) and Knee Pain (/)   HPI Wesley Beck is a 53 y.o. male well-known to the emergency department with this being his eighth visit in 6 months.  He was just here yesterday.  He came back because he has no place else to stay and is homeless.  He also states that he needs medication refills.  When he was seen the last time he had his psychiatric medications sent to the [facility] community pharmacy, but he did not go pick them up.  He said he needs the rest of his medications as well.  He says that his knee hurts because he walks so much and then he tripped and injured his knee again on the left side.  He is still able to walk and bear weight and there is no swelling.     Physical Exam   Triage Vital Signs: ED Triage Vitals  Encounter Vitals Group     BP 12/29/23 2252 (!) 164/125     Girls Systolic BP Percentile --      Girls Diastolic BP Percentile --      Boys Systolic BP Percentile --      Boys Diastolic BP Percentile --      Pulse Rate 12/29/23 2252 81     Resp 12/29/23 2252 18     Temp 12/29/23 2252 98.3 F (36.8 C)     Temp Source 12/29/23 2252 Oral     SpO2 12/29/23 2252 97 %     Weight 12/29/23 2254 97 kg (213 lb 13.5 oz)     Height 12/29/23 2254 1.753 m (5' 9)     Head Circumference --      Peak Flow --      Pain Score --      Pain Loc --      Pain Education --      Exclude from Growth Chart --     Most recent vital signs: Vitals:   12/29/23 2252  BP: (!) 164/125  Pulse: 81  Resp: 18  Temp: 98.3 F (36.8 C)  SpO2: 97%    General: Awake, no distress.  CV:  Good peripheral perfusion.  Resp:  Normal effort. Speaking easily and comfortably, no accessory muscle usage nor intercostal retractions.   Abd:  No distention.  Other:  No swelling or effusion to the left knee and he is able to bear  weight without any limp.  Normal range of motion, no tenderness to palpation.   ED Results / Procedures / Treatments   Labs (all labs ordered are listed, but only abnormal results are displayed) Labs Reviewed - No data to display   RADIOLOGY I independently viewed and interpreted the patient's knee x-rays and there is no evidence of fracture nor dislocation.   PROCEDURES:  Critical Care performed: No  Procedures    IMPRESSION / MDM / ASSESSMENT AND PLAN / ED COURSE  I reviewed the triage vital signs and the nursing notes.                              Differential diagnosis includes, but is not limited to, malingering, secondary gain, lack of resources.  Patient's presentation is most consistent with acute, uncomplicated illness.  Labs/studies ordered: Left knee x-rays  Interventions/Medications given:  Medications - No data to display  (Note:  hospital course my include additional interventions and/or labs/studies not listed above.)   Patient is malingering essentially admitting as much by stating he had nowhere else to go tonight so he came here and hoped to sleep until morning.  I explained that is not acceptable and that we would need to discharge him with outpatient resources including local shelters.  I refilled many of his medications by sending them to the [facility] community pharmacy.  He knows he needs to go pick them up.  I also given him information about local shelters, social services, transportation, and the [facility].    The patient's medical screening exam is reassuring with no indication of an emergent medical condition requiring hospitalization or additional evaluation at this point.  The patient is safe and appropriate for discharge and outpatient follow up.       FINAL CLINICAL IMPRESSION(S) / ED DIAGNOSES   Final diagnoses:  Homelessness  Malingering  Medication refill     Rx / DC Orders   ED Discharge Orders          Ordered     albuterol  (VENTOLIN  HFA) 108 (90 Base) MCG/ACT inhaler  Every 4 hours PRN        12/30/23 0318    atorvastatin  (LIPITOR) 20 MG tablet  See admin instructions        12/30/23 0318    budesonide -glycopyrrolate -formoterol  (BREZTRI ) 160-9-4.8 MCG/ACT AERO inhaler  2 times daily        12/30/23 0318    carvedilol  (COREG ) 3.125 MG tablet  2 times daily with meals        12/30/23 0318    hydrOXYzine  (ATARAX ) 25 MG tablet  3 times daily PRN        12/30/23 0318    loratadine  (CLARITIN ) 10 MG tablet  Daily PRN        12/30/23 0318             Note:  This document was prepared using Dragon voice recognition software and may include unintentional dictation errors.   Gordan Huxley, MD 12/30/23 [id number]

## 2023-12-30 NOTE — Discharge Instructions (Signed)
 Medication refills were sent to the [facility] regional community pharmacy.  Please consider contacting the [facility] clinic to establish a primary care provider.  Please look through the included resource guides for options of shelters and other outpatient resources that may be available to you.

## 2023-12-31 ENCOUNTER — Other Ambulatory Visit: Payer: Self-pay

## 2024-01-01 ENCOUNTER — Other Ambulatory Visit: Payer: Self-pay

## 2024-01-10 ENCOUNTER — Other Ambulatory Visit: Payer: Self-pay

## 2024-01-31 NOTE — Consults (Signed)
 [facility] Acute Telepsychiatry ED Initial Consult   SERVICE DATE: January 31, 2024  Assessment:   Wesley Beck is a 53 y.o., Other Race race, Not Hispanic, Latino/a, or Spanish origin ethnicity,  ENGLISH speaking male  with a history of schizoaffective disorder, bipolar type, who presents for evaluation of agitation.   Per chart review and initial telepsych evaluation, patient's presentation is most consistent with known diagnosis of schizoaffective disorder, bipolar type, currently manic. Patient was tangential and disorganized, largely unable to provide valuable history, but has been agitated towards family and demonstrates paranoia, word salad, disorganized thought, and bizarre beliefs, all in the context of medication non-adherence for a month per patient report. Given degree of disorganization, recommend hospitalization for safety, stabilization, and medication re-initiation.     The patient is at acute elevated risk of suicide/dangerousness to others and further worsening of psychiatric condition. Risk factors for suicide for this patient include: current treatment non-compliance, current diagnosis of schizophrenia, agitation, chronic mental illness > 5 years, past substance abuse, and chronic mood lability .  Risk factors for violence for this patient include: male gender, active symptoms of psychosis, active symptoms of mania, aggression, agitation, perceives threats in others, history of aggressive behavior, past substance abuse, and lack of insight. Protective factors for this patient are limited to: no know access to weapons or firearms. The patient DOES meet [place]  involuntary commitment criteria at this time.   Psychiatric Diagnoses:  Schizoaffective disorder, bipolar type  Recommendations:   Disposition:  Recommend inpatient psychiatric hospitalization. Please work with appropriate staff to facilitate this process. - Recommend follow-up consultation requests only as  specific clinical questions arise.  IVC Status:  This patient DOES meet IVC criteria given presence of mental illness and evidence of acute or imminent dangerousness to self or others. -- Per review of records, patient has been placed under petition. 1st Qualified Physician's Exam (QPE) should be completed by qualified provider.. -- Specific behaviors that justify IVC include: psychosis causing patient to have aggressive behavior towards family and lack of situational awareness/reality testing  Safety Considerations: -- Based on this psychiatric evaluation, we estimate the patient to be at low risk for suicide in the current setting. We recommend routine level of observation per hospital policy.   Psychiatric Medication Recommendations:  -- Restart olanzapine  10 mg at bedtime  -- Offer olanzapine  2.5 mg TID PRN agitation, aggression. Can be given IM, do not use within 2 hours of administering IM lorazepam .   Further Work-up: -- Recommend labs/studies including: UDS   I conducted a audio/video visit. I spent 10 minutes on the video call with the patient. I spent an additional 40 minutes on pre- and post-visit activities on the date of service .    Rockie Che, MD    Subjective:   REASON FOR CONSULT: Patient is seen in consultation at the request of Abid Hildegard Fairly, MD for evaluation of Aggression/Agitation and Psychosis.  PATIENT INTERVIEW: Pt states that he needs 8 years away from them. When asked who them is, gives address. Person he used to call his father lives at that [employer]. Wanted to see his son to have a question that his son had answers to. Demonstrates significant word salad and disorganization of speech, decompensates with anger towards his family and states that he needs somewhere to stay, even if it's at the bottom of a river. Wants his ashes halfway between his parents house and [facility]. Hasn't taken medications for a month. Doesn't want to die,  people want him dead. He'll  take medications if he has some place to stay until 02/19/32.   PAST PSYCHIATRIC HISTORY: - Prior psychiatric diagnoses: substance use, schizophrenia, schizoaffective disorder, paranoid personality disorder, antisocial personality disorder - Psychiatric hospitalizations: several, numerous ER presentations this year - Inpatient substance abuse treatment: numerous - Suicide attempts: once in the 90s via overdose - Non-suicidal self-injury: denies - Medication trials/compliance: olanzapine  10 mg, past abilify  - Current/previous mental health treatment: none  SOCIAL HISTORY: Unhoused, single, has one adult son. Family lives in the area.   SUBSTANCE USE: - Tobacco use: PPD - Alcohol use: denies - Drug use: denies  FAMILY HISTORY: The patient's family history includes Hypertension in his father..  Medical/surgical history, medications, and allergies reviewed and updated as appropriate.  Objective:   VITAL SIGNS: BP 148/103   Pulse 90   Temp 36.5 C (97.7 F) (Oral)   Resp 18   SpO2 97%   Mental Status Exam: Appearance:   limited grooming, well-developed, and well-nourished  Behavior:  agitated  Motor:  psychomotor agitation  Speech/Language:    increased volume, hyperverbal, and pressured  Mood:   Awful  Affect:  Paranoid, irritable  Thought process and Associations:  disorganized, flight of ideas, and tangential  Thought Content:    Paranoia towards family  Perceptual disturbances:    behavior not concerning for response to internal stimuli   Thoughts of self harm  denies thoughts of self-harm. Denies suicidal ideation, plans, or intent.   Thoughts of harm to others  feels anger and having difficulty controlling the anger   Orientation:  Oriented to person, place, time, and general circumstances  Attention and Concentration:  Intermittent loss of concentration  Memory:  Unable to assess due to patient status/symptoms   Fund of knowledge:   Unable to assess due to patient  status/symptoms  Insight:    Poor  Judgment:   Poor  Impulse Control:  Poor   TEST RESULTS: I have reviewed the labs, studies, and imaging from the last 24 hours if available.  Psychometrics: N/A

## 2024-01-31 NOTE — ED Provider Notes (Addendum)
 [facility]  Emergency Department Provider Note     History   Chief Complaint Agitation   HPI  Wesley Beck is a 53 y.o. male  with a history of bipolar disorder, schizoaffective, polysubstance abuse who presents today with the police officer.  Patient apparently threw a chair became upset angry at home where he lives with both of his parents.  He did not hurt anyone and did not claim to hurt anyone.  He denies any homicidal or any suicidal ideations.  On evaluating the patient and talking to him he is acutely psychotic.  He goes from talking about dates from 41 anno domini 2 years till 2034.  He starts talking about the fact that he wants to live for another 8 years.  Does not seem to make any sense when he speak to him and neither do his explanations are his answers.  Family members are not at bedside.  Past Medical History: No date: Asthma (HHS-HCC)  No past surgical history on file.  Prior to Admission medications  Medication Dose, Route, Frequency  albuterol  (PROVENTIL  HFA) 90 mcg/actuation inhaler INHALE TWO INHALATIONS INTO THE LUNGS EVERY 6 HOURS AS NEEDED FOR WHEEZING  ARIPiprazole  (ABILIFY ) 20 MG tablet 20 mg, Oral  ARIPiprazole  (ABILIFY ) 30 MG tablet 30 mg, Oral, Nightly  azithromycin (ZITHROMAX) 250 MG tablet 250 mg, Oral, Daily (standard)  budesonide -formoterol  (SYMBICORT ) 80-4.5 mcg/actuation inhaler Inhalation  cetirizine (ZYRTEC) 10 MG tablet 10 mg, Oral  hydrocortisone  1 % cream Topical  hydrOXYzine  (VISTARIL ) 50 MG capsule Oral, Every 6 hours PRN  ibuprofen  (ADVIL ,MOTRIN ) 800 MG tablet TAKE ONE TABLET BY MOUTH EVERY 6 HOURS AS NEEDED FOR PAIN  loratadine  (CLARITIN ) 10 mg tablet 10 mg, Oral, Daily (standard)  predniSONE  (DELTASONE ) 20 MG tablet TAKE TWO TABLETS BY MOUTH ONCE DAILY FOR 3 DAYS  thiothixene  (NAVANE ) 10 MG capsule 10 mg, Oral  triamcinolone (KENALOG) 0.1 % cream Topical    Allergies Patient has no known allergies.   Short  Social History[1]  Review of Systems  Psychiatric/Behavioral:  The patient is nervous/anxious.   All other systems reviewed and are negative.   As in HPI, all systems reviewed and otherwise negative.  Physical Exam    There were no vitals filed for this visit.   Physical Exam  Constitutional: Patient appears well-developed and well nourished. Non toxic in appearance. HEENT: Unremarkable. Head: Atraumatic.  Eyes: Normal ocular movements. Neck: Supple with normal range of motion.  Pulmonary/Chest: Effort normal. No respiratory distress. Abdominal: Soft and non tender abdomen. Musculoskeletal: Extremities atraumatic. Neurological: Alert with no focal neurological deficit. Ambulatory with a steady gait. Skin: Warm and dry.  Nursing note and vital signs reviewed. Alert awake oriented to person place time able to converse and answer questions appropriately normally.  Denies any suicidal or homicidal ideations  ED Course        Procedures  No orders of the defined types were placed in this encounter.   ED Results No results found for any visits on 01/31/24.  Radiology No results found.   Medical Decision Making   I have reviewed the vital signs and the nursing notes. Labs and radiology results that were available during my care of the patient were independently reviewed by me and considered in my medical decision making.    This is a 53 year old male with underlying history of schizoaffective disorder bipolar disorder who became upset angry and threw a chair without injuring or wanting to injure anybody.  He has no suicidal  ideation no homicidal ideations.  Medical Decision Making Amount and/or Complexity of Data Reviewed Labs: ordered. ECG/medicine tests: ordered.     Differential Diagnosis: Bipolar disorder, schizoaffective disorder, depression, psychosis     ED Clinical Impression   Final diagnoses:  None   Acute psychosis  Procedures      This  record has been created using [employer]. Chart creation errors have been sought, but may not always have been located. Such creation errors do not reflect on the standard of medical care.    Maree Jonelle Lash, MD 01/31/24 1003       [1] Social History Tobacco Use   Smoking status: Every Day   Smokeless tobacco: Current  Substance Use Topics   Alcohol use: No   Maree Jonelle Lash, MD 01/31/24 1025

## 2024-02-03 NOTE — ED Provider Notes (Signed)
 ED Progress Note  The patient is still being observed in the emergency department for acute psychosis, but also for persistent asthma.  1113: Awake minimal distress Expiratory wheezes noted bilateral, but no significant increased work of breathing Heart regular rate and rhythm  4 puffs of albuterol  ordered  The patient has been transferred to [facility], Dr. Kondal Madaram

## 2024-02-12 NOTE — ED Notes (Signed)
 Provider cleared patient to leave with current vital signs, patient A&Ox4, NAD, resp even and non-labored. Discharge instructions, follow-up care, and any prescribed medications were reviewed with patient, patient verbalized understanding. No indication of any barriers to patient understanding discharge teaching. All questions answered at this time. Patient left with copy of discharge instructions, Ambulated out of ED with steady gait.

## 2024-02-12 NOTE — ED Provider Notes (Signed)
 " [facility] EMERGENCY DEPARTMENT  ED Provider Note History  No chief complaint on file.   History of Present Illness    Wesley Beck is a 54 y.o. male with a history of bipolar disorder, depression, polysubstance abuse, tobacco use. Presents with SVT. The pt has had this before once before in the setting of cocaine use. Denies drugs or alcohol today Denies si or hi He is homeless Overall well appearing and hd stable Patient Info:    History provided by:  Patient  Level of Interpreter Services: No interpreter needed (no language barrier)  Past Medical History:  Diagnosis Date   Asthma without status asthmaticus (HHS-HCC)    Bipolar affective disorder (CMS/HHS-HCC)    Schizoaffective disorder (CMS/HHS-HCC)    No past surgical history on file. Family History  Problem Relation Age of Onset   Emphysema Mother    Social History   Socioeconomic History   Marital status: Single  Tobacco Use   Smoking status: Every Day    Current packs/day: 0.00    Types: Cigarettes    Last attempt to quit: 05/03/2014    Years since quitting: 9.7   Smokeless tobacco: Never  Substance and Sexual Activity   Alcohol use: Yes   Social Drivers of Health   Food Insecurity: Patient Declined (09/02/2023)   Received from [facility]   Hunger Vital Sign    Within the past 12 months, you worried that your food would run out before you got the money to buy more.: Patient declined    Within the past 12 months, the food you bought just didn't last and you didn't have money to get more.: Patient declined  Transportation Needs: No Transportation Needs (09/02/2023)   Received from [facility] - Transportation    In the past 12 months, has lack of transportation kept you from medical appointments or from getting medications?: No    In the past 12 months, has lack of transportation kept you from meetings, work, or from getting things needed for daily living?: No  Housing Stability: High  Risk (12/20/2023)   Housing Stability Vital Sign    Homeless in the Last Year: Yes   Review of Systems  All other systems reviewed and are negative.   Physical Exam  BP 93/51 (BP Location: Right upper arm, Patient Position: Sitting)   Pulse (!) 215   Temp 36.9 C (98.4 F) (Oral)   Resp 22   Ht 175.3 cm (5' 9)   Wt 97 kg (213 lb 12.8 oz)   SpO2 97%   BMI 31.57 kg/m  Physical Exam Vitals and nursing note reviewed.  Constitutional:      General: He is not in acute distress.    Appearance: He is well-developed. He is not diaphoretic.  HENT:     Head: Normocephalic and atraumatic.     Right Ear: External ear normal.     Left Ear: External ear normal.     Nose: Nose normal.     Mouth/Throat:     Mouth: Mucous membranes are moist.     Pharynx: No oropharyngeal exudate.  Eyes:     General: No scleral icterus.       Right eye: No discharge.        Left eye: No discharge.     Conjunctiva/sclera: Conjunctivae normal.     Pupils: Pupils are equal, round, and reactive to light.  Neck:     Thyroid : No thyromegaly.     Vascular: No  JVD.     Trachea: No tracheal deviation.  Cardiovascular:     Rate and Rhythm: Normal rate and regular rhythm.     Heart sounds: Normal heart sounds. No murmur heard.    No friction rub. No gallop.  Pulmonary:     Effort: Pulmonary effort is normal. No respiratory distress.     Breath sounds: Normal breath sounds. No stridor. No wheezing or rales.  Chest:     Chest wall: No tenderness.  Abdominal:     General: Bowel sounds are normal. There is no distension.     Palpations: Abdomen is soft. There is no mass.     Tenderness: There is no abdominal tenderness. There is no guarding or rebound.     Hernia: No hernia is present.  Musculoskeletal:        General: No tenderness or deformity. Normal range of motion.     Cervical back: Normal range of motion and neck supple.  Lymphadenopathy:     Cervical: No cervical adenopathy.  Skin:    General:  Skin is warm and dry.     Capillary Refill: Capillary refill takes less than 2 seconds.     Coloration: Skin is not jaundiced or pale.     Findings: No bruising or erythema.  Neurological:     Mental Status: He is alert and oriented to person, place, and time.     Cranial Nerves: No cranial nerve deficit.     Sensory: No sensory deficit.     Motor: No abnormal muscle tone.     Coordination: Coordination normal.     Deep Tendon Reflexes: Reflexes normal.    Physical Exam   Procedures  Critical Care  Performed by: Maree Bonney Dilling, MD Authorized by: Maree Bonney Dilling, MD   Critical care provider statement:    Critical care time (minutes):  35   Critical care time was exclusive of:  Separately billable procedures and treating other patients   Critical care was necessary to treat or prevent imminent or life-threatening deterioration of the following conditions: arrythmia.   Critical care was time spent personally by me on the following activities:  Blood draw for specimens, development of treatment plan with patient or surrogate, ordering and review of radiographic studies, ordering and review of laboratory studies, ordering and performing treatments and interventions, pulse oximetry, evaluation of patient's response to treatment, re-evaluation of patient's condition and examination of patient Comments:     CC time for arrythmia requiring constant bedside physician attendance with potential for acute decompensation and death for SVT requiring anti-arrythmic meds    Results  Medical Decision Making   Medical Decision Making     Medical Complexity:    New and requires workup.     Pertinent labs & imaging results that were available during my care of the patient were reviewed by me and considered in my medical decision making.     I obtained history from someone other than the patient.     I reviewed previous medical records.   Summary Statement:     1. SVT- pt is stable and  cardioverted with 6 mg iv adenosine  under emergency conditions and pt tolerated tx well. Pt returned to sinus rhythm Overall well appearing and in nad          ED Clinical Impression  1. Shortness of breath   2. Palpitations               This note has been created using  automated tools and reviewed for accuracy by Kant Onnie Elders   AI Note Feedback-- Point of Care Survey   Maree Bonney Onnie, MD 02/12/24 2142  "

## 2024-02-13 NOTE — ED Notes (Signed)
 Pt offered ride share for dsicharge, states I don't have anywhere to go, I'll just sit in the lobby until I can call someone

## 2024-02-13 NOTE — ED Provider Notes (Signed)
 "   [facility] Emergency Department Provider Note Room: W ED 06/W 06   Medical Decision Making   Wesley Beck is a 54 y.o. male past medical history asthma presents ED for shortness of breath and cough.  Vital signs reviewed.  Physical exam as above.  Differential includes asthma exacerbation, pneumonia, viral infection.  Low suspicion for ACS/PE without complaint of chest pain, and obvious wheezing on exam.  Will obtain labs, EKG, chest x-ray, viral swab.  Ordered albuterol , steroids, will reassess.  Independent interpretation of studies: EKG shows normal sinus rhythm, incomplete right bundle branch block, left anterior fascicular block, no STEMI, chest x-ray shows no consolidation or infiltrate    Progress Notes     ED Course as of 02/13/24 0528  Wed Feb 13, 2024  0321 EKG by my independent interpretation shows normal sinus rhythm, incomplete right bundle branch block, left anterior fascicular block, no STEMI  0322 Chest x-ray by my independent interpretation shows no focal consolidation or infiltrate  0428 Viral swab negative  0428 Patient does have a mild transaminitis on his lab work, denies recent alcohol use, no abdominal pain or tenderness on exam, recommended to follow-up with his primary care physician regarding this  0428 On reevaluation, patient saturating 95% on room air, he has an occasional expiratory wheeze, but otherwise he has no increased work of breathing.  I feel he stable for discharge at this time with strict return precautions, recommended to follow-up with PCP closely, will write for short course of steroids.    Disposition   Clinical Impression:     Diagnosis Comment Added By Time Added   Exacerbation of asthma, unspecified asthma severity, unspecified whether persistent  Cameron Rudy Farr, MD 02/13/2024  4:29 AM   Elevated liver enzymes  Conner Rudy Farr, MD 02/13/2024  4:31 AM      Final Disposition: Discharge   History   Chief Complaint in  Triage  Patient presents with   Shortness of Breath    Bib ems, for shortness of breath. Hx of asthma, ems gave him 1 dose of Duoneb. Ems states he was in [facility] ED earlier due to SVT and patient was given Adenosine . Denies chest pain at this time.    The historian is the patient   HPI:  Wesley Beck is a 54 y.o. male past medical history asthma presents ED for shortness of breath and cough.  Reportedly, patient was recently seen at [facility] ER earlier for SVT and was given adenosine .  He reports he is had shortness of breath, cough, wheezing over the last several days, has been using his inhaler without significant relief.  No chest pain.  No fevers or chills.  He was exposed to recent sick contacts at home with shelter.  He continues to actively smoke.  Received a DuoNeb in the field with some relief.  MEDICATIONS:  Patient's Medications  Previous Medications   No medications on file  Modified Medications   No medications on file    ALLERGIES: Allergies[1]  PAST MEDICAL HISTORY:  Past Medical History[2]  PAST SURGICAL HISTORY: Past Surgical History[3]  SOCIAL HISTORY:  Social History   Tobacco Use   Smoking status: Every Day    Current packs/day: 0.50    Types: Cigarettes   Smokeless tobacco: Not on file  Substance Use Topics   Alcohol use: Never     FAMILY HISTORY:  Family History[4]  Physical Exam    ED Triage Vitals [02/13/24 0232]  Temp  97.5 F (36.4 C)  Temp Source Oral  Heart Rate 79  BP 126/70  Respirations 16  SpO2 98 %   Reviewed vital signs and nursing note as charted by RN.  Physical Exam Constitutional:      General: He is not in acute distress. HENT:     Head: Normocephalic and atraumatic.     Nose: Nose normal.     Mouth/Throat:     Mouth: Mucous membranes are moist.  Eyes:     Conjunctiva/sclera: Conjunctivae normal.  Cardiovascular:     Rate and Rhythm: Normal rate and regular rhythm.     Heart sounds: No  murmur heard.    No friction rub. No gallop.  Pulmonary:     Comments: No respiratory distress, on room air, bilateral expiratory wheeze present Abdominal:     General: Abdomen is flat. There is no distension.     Palpations: Abdomen is soft.     Tenderness: There is no abdominal tenderness.  Musculoskeletal:        General: No swelling or tenderness.  Skin:    General: Skin is warm and dry.  Neurological:     General: No focal deficit present.     Mental Status: He is alert. Mental status is at baseline.  Psychiatric:        Mood and Affect: Mood normal.        Results    I personally reviewed the results in the chart as listed below  Results for orders placed or performed during the hospital encounter of 02/13/24  COVID, RSV and Influenza A/B Amplified Probes  Result Value Ref Range   Influenza A, Amplified Probe Negative Negative   Influenza B, Amplified Probe Negative Negative   RSV, Amplified Probe Negative Negative   SARS-CoV-2 Negative Negative  CBC with Differential  Result Value Ref Range   Differential Percent Diff %    Differential Absolute Diff Absolute    WBC 9.0 3.6 - 11.2 K/uL   RBC 4.27 4.06 - 5.63 M/uL   Hemoglobin 13.8 12.5 - 16.3 g/dL   Hematocrit 40 37 - 47 %   Mean Cell Volume 94 74 - 96 fL   Mean Cell Hemoglobin 32 24 - 33 pg   Mean Cell Hemoglobin Concentration 35 33 - 36 g/dL   RDW 85.9 87.6 - 82.9 %   Platelet Count 294 150 - 450 K/uL   Mean Platelet Volume 7.6 7.5 - 11.2 fL   Neutrophils 52 43 - 77 %   Lymphocytes 27 16 - 44 %   Monocytes 12 5 - 13 %   Eosinophils 8 1 - 8 %   Basophils 1 0 - 1 %   Neutrophils Absolute 4.8 1.8 - 7.8 K/uL   Lymphocytes Absolute 2.4 1.0 - 3.0 K/uL   Monocytes Absolute 1.1 (H) 0.3 - 1.0 K/uL   Eosinophils Absolute 0.7 (H) 0.0 - 0.5 K/uL   Basophils Absolute 0.1 0.0 - 0.1 K/uL   Nucleated RBCS 0 /100 WBC  CMP  Result Value Ref Range   Sodium 140 136 - 145 mmol/L   Potassium 4.1 3.5 - 5.1 mmol/L    Chloride 104 98 - 107 mmol/L   CO2 29 21 - 31 mmol/L   BUN 8 7 - 25 mg/dL   Creatinine 9.07 9.29 - 1.30 mg/dL   Glucose, Random 86 70 - 199 mg/dL   Calcium , Total 9.3 8.6 - 10.3 mg/dL   Osmolality (calculated) 277 270 - 295 mOsm/kg  Anion Gap 7 4 - 12   Albumin 4.2 3.5 - 5.7 g/dL   Bilirubin, Total 0.3 0.3 - 1.0 mg/dL   Alkaline Phosphatase 76 34 - 104 IU/L   ALT 78 (H) 7 - 52 IU/L   AST 81 (H) 13 - 39 IU/L   Protein, Total 6.0 (L) 6.4 - 8.9 g/dL   Albumin/Globulin Ratio 2.3 1.2 - 2.3  Lipase  Result Value Ref Range   Lipase 18 11 - 82 U/L  eGFR (CKD-EPI)  Result Value Ref Range   eGFR >60 mL/min/1.44m2  Lab Add On  Result Value Ref Range   Lab Add On PROCESSED   Alcohol, Ethyl Level  Result Value Ref Range   Alcohol, Ethyl <10 0 - 80 mg/dL   Imaging Results          XR Chest 2 Views (In process)                   ECG Results          ECG 12 lead; (Final result)  Result time 02/13/24 03:21:11    Final result             Impression:   Normal sinus rhythm Incomplete right bundle branch block Left anterior fascicular block Abnormal ECG No previous ECGs available Confirmed by LIN, CONNER ([id number]) on 02/13/2024 3:21:10 AM          Narrative:   Ventricular Rate: 70 Atrial Rate: 70 P-R Interval: 152 QRS Duration: 110 Q-T Interval: 414 QTC Calculation(Bazett): 447 P Axis: 72 R Axis: -72 T Axis: 56                              [1] No Known Allergies [2] Past Medical History: Diagnosis Date   Asthma   [3] History reviewed. No pertinent surgical history. [4] No family history on file.  Cameron Rudy Farr, MD 02/13/24 [phone]  "

## 2024-02-14 ENCOUNTER — Other Ambulatory Visit: Payer: Self-pay

## 2024-02-14 ENCOUNTER — Emergency Department (EMERGENCY_DEPARTMENT_HOSPITAL)
Admission: EM | Admit: 2024-02-14 | Discharge: 2024-02-15 | Disposition: A | Discharge status: Other Acute Inpatient Hospital | Source: Home / Self Care | Attending: Emergency Medicine | Admitting: Emergency Medicine

## 2024-02-14 ENCOUNTER — Encounter (HOSPITAL_COMMUNITY): Payer: Self-pay | Admitting: Emergency Medicine

## 2024-02-14 DIAGNOSIS — J45909 Unspecified asthma, uncomplicated: Secondary | ICD-10-CM | POA: Insufficient documentation

## 2024-02-14 DIAGNOSIS — Z7951 Long term (current) use of inhaled steroids: Secondary | ICD-10-CM | POA: Insufficient documentation

## 2024-02-14 DIAGNOSIS — F250 Schizoaffective disorder, bipolar type: Secondary | ICD-10-CM | POA: Insufficient documentation

## 2024-02-14 DIAGNOSIS — F1290 Cannabis use, unspecified, uncomplicated: Secondary | ICD-10-CM | POA: Insufficient documentation

## 2024-02-14 DIAGNOSIS — F29 Unspecified psychosis not due to a substance or known physiological condition: Secondary | ICD-10-CM

## 2024-02-14 LAB — COMPREHENSIVE METABOLIC PANEL WITH GFR
ALT: 52 U/L — ABNORMAL HIGH (ref 0–44)
AST: 38 U/L (ref 15–41)
Albumin: 4.4 g/dL (ref 3.5–5.0)
Alkaline Phosphatase: 86 U/L (ref 38–126)
Anion gap: 13 (ref 5–15)
BUN: 12 mg/dL (ref 6–20)
CO2: 27 mmol/L (ref 22–32)
Calcium: 9.5 mg/dL (ref 8.9–10.3)
Chloride: 101 mmol/L (ref 98–111)
Creatinine, Ser: 0.99 mg/dL (ref 0.61–1.24)
GFR, Estimated: >60 mL/min
Glucose, Bld: 87 mg/dL (ref 70–99)
Potassium: 3.5 mmol/L (ref 3.5–5.1)
Sodium: 141 mmol/L (ref 135–145)
Total Bilirubin: 0.3 mg/dL (ref 0.0–1.2)
Total Protein: 6.6 g/dL (ref 6.5–8.1)

## 2024-02-14 LAB — URINE DRUG SCREEN
Amphetamines: NEGATIVE
Barbiturates: NEGATIVE
Benzodiazepines: NEGATIVE
Cocaine: NEGATIVE
Fentanyl: NEGATIVE
Methadone Scn, Ur: NEGATIVE
Opiates: NEGATIVE
Tetrahydrocannabinol: POSITIVE — AB

## 2024-02-14 LAB — CBC
HCT: 42.7 % (ref 39.0–52.0)
Hemoglobin: 14.5 g/dL (ref 13.0–17.0)
MCH: 32.8 pg (ref 26.0–34.0)
MCHC: 34 g/dL (ref 30.0–36.0)
MCV: 96.6 fL (ref 80.0–100.0)
Platelets: 338 K/uL (ref 150–400)
RBC: 4.42 MIL/uL (ref 4.22–5.81)
RDW: 13.3 % (ref 11.5–15.5)
WBC: 10.1 K/uL (ref 4.0–10.5)
nRBC: 0 % (ref 0.0–0.2)

## 2024-02-14 LAB — URINALYSIS, ROUTINE W REFLEX MICROSCOPIC
Bilirubin Urine: NEGATIVE
Glucose, UA: NEGATIVE mg/dL
Hgb urine dipstick: NEGATIVE
Ketones, ur: NEGATIVE mg/dL
Leukocytes,Ua: NEGATIVE
Nitrite: NEGATIVE
Protein, ur: NEGATIVE mg/dL
Specific Gravity, Urine: 1.015 (ref 1.005–1.030)
pH: 5 (ref 5.0–8.0)

## 2024-02-14 LAB — ETHANOL: Alcohol, Ethyl (B): <15 mg/dL

## 2024-02-14 NOTE — ED Provider Notes (Signed)
 " [place] EMERGENCY DEPARTMENT AT [facility] Provider Note   CSN: [id number] Arrival date & time: 02/14/24  1706     Patient presents with: No chief complaint on file.   Wesley Beck is a 54 y.o. male with a history including schizophrenia, bipolar disorder, asthma, chronic back pain history of polysubstance abuse presenting for evaluation of a recognized mental health crisis.  He is unable to give a helpful history, multiple times his thoughts drift towards anger towards his father for not believing him when he hears voices and sees things that the father does not see and hear.  He is quite agitated but cooperative.  He frequently makes nonsensical statements, multiple times to the conversation mentioned needing to make it until January 2032.  When asked what he meant by this he demonstrated a string of word salad and nonsensical explanation.   The history is provided by the patient.       Prior to Admission medications  Medication Sig Start Date End Date Taking? Authorizing Provider  carvedilol  (COREG ) 3.125 MG tablet Take 1 tablet (3.125 mg total) by mouth 2 (two) times daily with a meal. 12/30/23 12/29/24 Yes Gordan Huxley, MD  loratadine  (CLARITIN ) 10 MG tablet Take 1 tablet (10 mg total) by mouth daily as needed for allergies. 12/30/23  Yes Gordan Huxley, MD  albuterol  (VENTOLIN  HFA) 108 [phone] Base) MCG/ACT inhaler Inhale 1-2 puffs into the lungs every 4 (four) hours as needed for wheezing or shortness of breath. 12/30/23   Gordan Huxley, MD  atorvastatin  (LIPITOR) 20 MG tablet Take 1 tablet (20 mg total) by mouth See admin instructions. Take it on Sunday, Tuesday, Wednesday, Thursday, and Saturday as needed for high cholesterol. 12/30/23   Gordan Huxley, MD  budesonide -glycopyrrolate -formoterol  (BREZTRI ) 160-9-4.8 MCG/ACT AERO inhaler Inhale 2 puffs into the lungs 2 (two) times daily. 12/30/23   Gordan Huxley, MD  Coenzyme Q10 (COQ10 PO) Take 1 tablet by mouth 3 (three)  times a week. Monday, Wednesday, and Friday    [provider]  hydrOXYzine  (ATARAX ) 25 MG tablet Take 1 tablet (25 mg total) by mouth 3 (three) times daily as needed for anxiety. 12/30/23   Gordan Huxley, MD  Melatonin 12 MG TABS Take 5-12 tablets by mouth at bedtime.    [provider]  meloxicam (MOBIC) 7.5 MG tablet Take 7.5 mg by mouth 2 (two) times daily. 12/03/23   [provider]  Menthol-Zinc Oxide 0.44-20.6 % OINT Apply topically 2 (two) times daily. 09/09/23   [provider]  OLANZapine  (ZYPREXA ) 10 MG tablet Take 1 tablet (10 mg total) by mouth at bedtime. 12/29/23 12/28/24  Cyrena Mylar, MD  rosuvastatin  (CRESTOR ) 10 MG tablet Take 10 mg by mouth 2 (two) times a week. Monday and Friday 06/01/23   [provider]  SPIRIVA HANDIHALER 18 MCG CAPS Irrigate with 1 capsule as directed daily. 12/27/23   [provider]  thiamine  (VITAMIN B1) 100 MG tablet Take 100 mg by mouth daily.    [provider]  DOMINIC BECK 100-62.5-25 MCG/ACT AEPB Take 1 puff by mouth daily. 10/24/23   [provider]  zinc gluconate 50 MG tablet Take 50 mg by mouth daily.    [provider]    Allergies: Cat dander and Dog epithelium (canis lupus familiaris)    Review of Systems  Unable to perform ROS: Psychiatric disorder    Updated Vital Signs BP 138/84   Pulse (!) 58   Temp 98.3 F (36.8  C) (Oral)   Resp 16   SpO2 96%   Physical Exam Vitals and nursing note reviewed.  Constitutional:      General: He is not in acute distress.    Appearance: He is well-developed.  HENT:     Head: Normocephalic and atraumatic.  Eyes:     Conjunctiva/sclera: Conjunctivae normal.  Cardiovascular:     Rate and Rhythm: Normal rate.  Pulmonary:     Effort: Pulmonary effort is normal.  Musculoskeletal:        General: Normal range of motion.     Cervical back: Normal range of motion.  Skin:    General: Skin is warm and dry.   Neurological:     General: No focal deficit present.     Mental Status: He is alert.  Psychiatric:        Attention and Perception: He is inattentive.        Mood and Affect: Affect is labile.        Speech: Speech is rapid and pressured.        Behavior: Behavior is agitated. Behavior is cooperative.        Thought Content: Thought content is delusional. Thought content does not include homicidal or suicidal ideation.        Judgment: Judgment is inappropriate.     (all labs ordered are listed, but only abnormal results are displayed) Labs Reviewed  URINE DRUG SCREEN - Abnormal; Notable for the following components:      Result Value   Tetrahydrocannabinol POSITIVE (*)    All other components within normal limits  COMPREHENSIVE METABOLIC PANEL WITH GFR - Abnormal; Notable for the following components:   ALT 52 (*)    All other components within normal limits  URINALYSIS, ROUTINE W REFLEX MICROSCOPIC  ETHANOL  CBC    EKG: None  Radiology: No results found.   Procedures   Medications Ordered in the ED - No data to display                                  Medical Decision Making Patient presenting with suspected untreated schizophrenia and probable noncompliance with medications.  He will require evaluation by TTS which is currently pending.  Commitment papers were submitted to prevent patient from leaving prior to full evaluation.  Amount and/or Complexity of Data Reviewed Labs: ordered.    Details: Labs reviewed without significant findings, positive for THC.  Risk Diagnosis or treatment significantly limited by social determinants of health.        Final diagnoses:  None    ED Discharge Orders     None          Birdena Clarity, DEVONNA 02/15/24 0016  "

## 2024-02-14 NOTE — ED Notes (Signed)
 Pt is very comfortable at this time, pt is in the bed at this time.

## 2024-02-14 NOTE — ED Notes (Signed)
Pt changed into scrubs and wanded 

## 2024-02-14 NOTE — ED Notes (Signed)
 Pts belongings placed in locker 9 and a [employer] bag on the floor in front of locker 9 (bag did not fit in locker)
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
## 2024-02-15 ENCOUNTER — Other Ambulatory Visit: Payer: Self-pay

## 2024-02-15 ENCOUNTER — Encounter (HOSPITAL_COMMUNITY): Payer: Self-pay | Admitting: Psychiatry

## 2024-02-15 ENCOUNTER — Encounter (HOSPITAL_COMMUNITY): Payer: Self-pay

## 2024-02-15 ENCOUNTER — Inpatient Hospital Stay (HOSPITAL_COMMUNITY)
Admission: AD | Admit: 2024-02-15 | Discharge: 2024-02-25 | DRG: 885 | Disposition: A | Discharge status: Home / Self Care

## 2024-02-15 DIAGNOSIS — G8929 Other chronic pain: Secondary | ICD-10-CM | POA: Diagnosis present

## 2024-02-15 DIAGNOSIS — Z79899 Other long term (current) drug therapy: Secondary | ICD-10-CM | POA: Diagnosis not present

## 2024-02-15 DIAGNOSIS — F1910 Other psychoactive substance abuse, uncomplicated: Secondary | ICD-10-CM | POA: Diagnosis present

## 2024-02-15 DIAGNOSIS — Z7951 Long term (current) use of inhaled steroids: Secondary | ICD-10-CM

## 2024-02-15 DIAGNOSIS — F200 Paranoid schizophrenia: Secondary | ICD-10-CM | POA: Diagnosis present

## 2024-02-15 DIAGNOSIS — M1990 Unspecified osteoarthritis, unspecified site: Secondary | ICD-10-CM | POA: Diagnosis present

## 2024-02-15 DIAGNOSIS — M549 Dorsalgia, unspecified: Secondary | ICD-10-CM | POA: Diagnosis present

## 2024-02-15 DIAGNOSIS — F209 Schizophrenia, unspecified: Secondary | ICD-10-CM | POA: Diagnosis not present

## 2024-02-15 DIAGNOSIS — Z5901 Sheltered homelessness: Secondary | ICD-10-CM | POA: Diagnosis not present

## 2024-02-15 DIAGNOSIS — Z91411 Personal history of adult psychological abuse: Secondary | ICD-10-CM | POA: Diagnosis not present

## 2024-02-15 DIAGNOSIS — E785 Hyperlipidemia, unspecified: Secondary | ICD-10-CM | POA: Diagnosis present

## 2024-02-15 DIAGNOSIS — Z765 Malingerer [conscious simulation]: Secondary | ICD-10-CM | POA: Diagnosis not present

## 2024-02-15 DIAGNOSIS — I10 Essential (primary) hypertension: Secondary | ICD-10-CM | POA: Diagnosis present

## 2024-02-15 DIAGNOSIS — Z8249 Family history of ischemic heart disease and other diseases of the circulatory system: Secondary | ICD-10-CM | POA: Diagnosis not present

## 2024-02-15 DIAGNOSIS — F201 Disorganized schizophrenia: Principal | ICD-10-CM

## 2024-02-15 DIAGNOSIS — F250 Schizoaffective disorder, bipolar type: Secondary | ICD-10-CM | POA: Diagnosis not present

## 2024-02-15 DIAGNOSIS — F1290 Cannabis use, unspecified, uncomplicated: Secondary | ICD-10-CM

## 2024-02-15 DIAGNOSIS — F602 Antisocial personality disorder: Secondary | ICD-10-CM | POA: Diagnosis present

## 2024-02-15 DIAGNOSIS — Z9109 Other allergy status, other than to drugs and biological substances: Secondary | ICD-10-CM

## 2024-02-15 DIAGNOSIS — L309 Dermatitis, unspecified: Secondary | ICD-10-CM | POA: Diagnosis present

## 2024-02-15 DIAGNOSIS — F17210 Nicotine dependence, cigarettes, uncomplicated: Secondary | ICD-10-CM | POA: Diagnosis present

## 2024-02-15 DIAGNOSIS — Z91410 Personal history of adult physical and sexual abuse: Secondary | ICD-10-CM

## 2024-02-15 DIAGNOSIS — Z825 Family history of asthma and other chronic lower respiratory diseases: Secondary | ICD-10-CM | POA: Diagnosis not present

## 2024-02-15 DIAGNOSIS — J4489 Other specified chronic obstructive pulmonary disease: Secondary | ICD-10-CM | POA: Diagnosis present

## 2024-02-15 DIAGNOSIS — F1210 Cannabis abuse, uncomplicated: Secondary | ICD-10-CM | POA: Diagnosis present

## 2024-02-15 DIAGNOSIS — F259 Schizoaffective disorder, unspecified: Secondary | ICD-10-CM | POA: Diagnosis present

## 2024-02-15 DIAGNOSIS — Z9151 Personal history of suicidal behavior: Secondary | ICD-10-CM | POA: Diagnosis not present

## 2024-02-15 HISTORY — DX: Suicide attempt, initial encounter: T14.91XA

## 2024-02-15 HISTORY — DX: Chronic obstructive pulmonary disease, unspecified: J44.9

## 2024-02-15 MED ORDER — CARVEDILOL 3.125 MG PO TABS
3.1250 mg | ORAL_TABLET | Freq: Two times a day (BID) | ORAL | Status: DC
  Administered 2024-02-15: 3.125 mg via ORAL
  Filled 2024-02-15: qty 1

## 2024-02-15 MED ORDER — DIPHENHYDRAMINE HCL 50 MG/ML IJ SOLN: 50.0000 mg | Freq: Three times a day (TID) | INTRAMUSCULAR | Status: DC | PRN

## 2024-02-15 MED ORDER — ACETAMINOPHEN 325 MG PO TABS: 650.0000 mg | ORAL_TABLET | ORAL | Status: DC | PRN

## 2024-02-15 MED ORDER — CARVEDILOL 3.125 MG PO TABS
3.1250 mg | ORAL_TABLET | Freq: Two times a day (BID) | ORAL | Status: DC
  Administered 2024-02-15 – 2024-02-25 (×20): 3.125 mg via ORAL
  Filled 2024-02-15: qty 14
  Filled 2024-02-15 (×20): qty 1

## 2024-02-15 MED ORDER — CLONIDINE HCL 0.1 MG PO TABS
0.1000 mg | ORAL_TABLET | Freq: Three times a day (TID) | ORAL | Status: DC | PRN
  Administered 2024-02-17 – 2024-02-23 (×4): 0.1 mg via ORAL
  Filled 2024-02-15 (×4): qty 1

## 2024-02-15 MED ORDER — OLANZAPINE 5 MG PO TABS
10.0000 mg | ORAL_TABLET | Freq: Every day | ORAL | Status: DC
  Administered 2024-02-15: 10 mg via ORAL
  Filled 2024-02-15: qty 2

## 2024-02-15 MED ORDER — ALBUTEROL SULFATE HFA 108 (90 BASE) MCG/ACT IN AERS
1.0000 | INHALATION_SPRAY | RESPIRATORY_TRACT | Status: DC | PRN
  Administered 2024-02-15: 2 via RESPIRATORY_TRACT
  Filled 2024-02-15: qty 6.7

## 2024-02-15 MED ORDER — DIPHENHYDRAMINE HCL 25 MG PO CAPS: 50.0000 mg | ORAL_CAPSULE | Freq: Three times a day (TID) | ORAL | Status: DC | PRN

## 2024-02-15 MED ORDER — BUDESON-GLYCOPYRROL-FORMOTEROL 160-9-4.8 MCG/ACT IN AERO
2.0000 | INHALATION_SPRAY | Freq: Two times a day (BID) | RESPIRATORY_TRACT | Status: DC
  Administered 2024-02-16 – 2024-02-25 (×19): 2 via RESPIRATORY_TRACT
  Filled 2024-02-15 (×2): qty 5.9

## 2024-02-15 MED ORDER — LORATADINE 10 MG PO TABS: 10.0000 mg | ORAL_TABLET | Freq: Every day | ORAL | Status: DC | PRN

## 2024-02-15 MED ORDER — DIPHENHYDRAMINE HCL 25 MG PO CAPS: 50.0000 mg | ORAL_CAPSULE | Freq: Four times a day (QID) | ORAL | Status: DC | PRN

## 2024-02-15 MED ORDER — ATORVASTATIN CALCIUM 10 MG PO TABS: 20.0000 mg | ORAL_TABLET | ORAL | Status: DC

## 2024-02-15 MED ORDER — NICOTINE 21 MG/24HR TD PT24
21.0000 mg | MEDICATED_PATCH | Freq: Every day | TRANSDERMAL | Status: DC
  Administered 2024-02-15: 21 mg via TRANSDERMAL
  Filled 2024-02-15: qty 1

## 2024-02-15 MED ORDER — ONDANSETRON HCL 4 MG PO TABS: 4.0000 mg | ORAL_TABLET | Freq: Three times a day (TID) | ORAL | Status: DC | PRN

## 2024-02-15 MED ORDER — ALBUTEROL SULFATE HFA 108 (90 BASE) MCG/ACT IN AERS
1.0000 | INHALATION_SPRAY | RESPIRATORY_TRACT | Status: DC | PRN
  Administered 2024-02-15 – 2024-02-19 (×4): 2 via RESPIRATORY_TRACT
  Administered 2024-02-19: 1 via RESPIRATORY_TRACT
  Administered 2024-02-20: 2 via RESPIRATORY_TRACT
  Administered 2024-02-21 – 2024-02-22 (×2): 1 via RESPIRATORY_TRACT
  Filled 2024-02-15 (×2): qty 6.7

## 2024-02-15 MED ORDER — LORAZEPAM 2 MG/ML IJ SOLN: 2.0000 mg | Freq: Three times a day (TID) | INTRAMUSCULAR | Status: DC | PRN

## 2024-02-15 MED ORDER — HALOPERIDOL LACTATE 5 MG/ML IJ SOLN: 5.0000 mg | Freq: Four times a day (QID) | INTRAMUSCULAR | Status: DC | PRN

## 2024-02-15 MED ORDER — ALUM & MAG HYDROXIDE-SIMETH 200-200-20 MG/5ML PO SUSP: 30.0000 mL | ORAL | Status: DC | PRN

## 2024-02-15 MED ORDER — OLANZAPINE 5 MG PO TABS: 15.0000 mg | ORAL_TABLET | Freq: Every day | ORAL | Status: DC

## 2024-02-15 MED ORDER — HALOPERIDOL 5 MG PO TABS: 5.0000 mg | ORAL_TABLET | Freq: Three times a day (TID) | ORAL | Status: DC | PRN

## 2024-02-15 MED ORDER — HALOPERIDOL 5 MG PO TABS: 5.0000 mg | ORAL_TABLET | Freq: Four times a day (QID) | ORAL | Status: DC | PRN

## 2024-02-15 MED ORDER — MAGNESIUM HYDROXIDE 400 MG/5ML PO SUSP: 30.0000 mL | Freq: Every day | ORAL | Status: DC | PRN

## 2024-02-15 MED ORDER — NICOTINE 21 MG/24HR TD PT24
21.0000 mg | MEDICATED_PATCH | Freq: Every day | TRANSDERMAL | Status: DC
  Administered 2024-02-16 – 2024-02-25 (×10): 21 mg via TRANSDERMAL
  Filled 2024-02-15 (×10): qty 1
  Filled 2024-02-15: qty 7

## 2024-02-15 MED ORDER — HALOPERIDOL LACTATE 5 MG/ML IJ SOLN: 10.0000 mg | Freq: Three times a day (TID) | INTRAMUSCULAR | Status: DC | PRN

## 2024-02-15 MED ORDER — ROSUVASTATIN CALCIUM 5 MG PO TABS
10.0000 mg | ORAL_TABLET | ORAL | Status: DC
  Administered 2024-02-15: 10 mg via ORAL
  Filled 2024-02-15: qty 2

## 2024-02-15 MED ORDER — HALOPERIDOL LACTATE 5 MG/ML IJ SOLN: 5.0000 mg | Freq: Three times a day (TID) | INTRAMUSCULAR | Status: DC | PRN

## 2024-02-15 MED ORDER — LORATADINE 10 MG PO TABS
10.0000 mg | ORAL_TABLET | Freq: Every day | ORAL | Status: DC | PRN
  Administered 2024-02-17: 10 mg via ORAL
  Filled 2024-02-15: qty 1

## 2024-02-15 MED ORDER — BUDESON-GLYCOPYRROL-FORMOTEROL 160-9-4.8 MCG/ACT IN AERO
2.0000 | INHALATION_SPRAY | Freq: Two times a day (BID) | RESPIRATORY_TRACT | Status: DC
  Administered 2024-02-15: 2 via RESPIRATORY_TRACT

## 2024-02-15 MED ORDER — BUDESON-GLYCOPYRROL-FORMOTEROL 160-9-4.8 MCG/ACT IN AERO
2.0000 | INHALATION_SPRAY | Freq: Two times a day (BID) | RESPIRATORY_TRACT | Status: DC
  Filled 2024-02-15 (×2): qty 5.9

## 2024-02-15 MED ORDER — HYDROXYZINE HCL 25 MG PO TABS
25.0000 mg | ORAL_TABLET | Freq: Three times a day (TID) | ORAL | Status: DC | PRN
  Administered 2024-02-15 – 2024-02-24 (×7): 25 mg via ORAL
  Filled 2024-02-15 (×7): qty 1

## 2024-02-15 MED ORDER — ACETAMINOPHEN 325 MG PO TABS
650.0000 mg | ORAL_TABLET | Freq: Four times a day (QID) | ORAL | Status: DC | PRN
  Administered 2024-02-16 – 2024-02-25 (×6): 650 mg via ORAL
  Filled 2024-02-15 (×6): qty 2

## 2024-02-15 MED ORDER — NICOTINE 21 MG/24HR TD PT24: 21.0000 mg | MEDICATED_PATCH | Freq: Every day | TRANSDERMAL | Status: DC

## 2024-02-15 MED ORDER — OLANZAPINE 7.5 MG PO TABS
15.0000 mg | ORAL_TABLET | Freq: Every day | ORAL | Status: DC
  Administered 2024-02-15: 15 mg via ORAL
  Filled 2024-02-15: qty 2

## 2024-02-15 NOTE — Plan of Care (Signed)
   Problem: Education: Goal: Knowledge of [facility] Education information/materials will improve Outcome: Progressing Goal: Verbalization of understanding the information provided will improve Outcome: Progressing

## 2024-02-15 NOTE — Progress Notes (Signed)
 Pt has been accepted to [facility] on 02/15/2024. Bed assignment:407-1  Pt meets inpatient criteria per, Wyline Pizza, PMHNP  Attending Physician will be, Leita Arts, MD  Report can be called to:  -Adult unit: [phone]  Pt can arrive after  TBD  Care Team Notified: [facility], Cherylynn Ernst, RN, Wyline Pizza, PMHNP,    Niel Nightingale MSW, LCSW

## 2024-02-15 NOTE — ED Provider Notes (Signed)
" °  Physical Exam  BP 138/84   Pulse (!) 58   Temp 98.3 F (36.8 C) (Oral)   Resp 16   SpO2 96%   Physical Exam  Procedures  Procedures  ED Course / MDM    Medical Decision Making Amount and/or Complexity of Data Reviewed Labs: ordered.  Risk OTC drugs. Prescription drug management.   Inpatient treatment recommended.  Appears IVC has been done.       Patsey Lot, MD 02/15/24 [phone]  "

## 2024-02-15 NOTE — Group Note (Signed)
 Date:  02/15/2024 Time:  6:34 PM  Group Topic/Focus: The diet's effect on mental wellness. Self Care:   The focus of this group is to help patients understand the importance of self-care in order to improve or restore emotional, physical, spiritual, interpersonal, and financial health.      Participation Level:  Did Not Attend   Wesley Beck 02/15/2024, 6:34 PM
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
## 2024-02-15 NOTE — BH Assessment (Signed)
 Clinician spoke to Faith with [facility] to complete pt's TTS assessment. Clinician provided pt's name, MRN, location, age, room number. Secure message completed.    [facility] coordinator to update secure chat when assessment time and provider are assigned.   Jackson JONETTA Broach, MS, [facility], [facility] Triage Specialist [phone]
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
## 2024-02-15 NOTE — ED Notes (Signed)
 Report given To rebecca at [facility].

## 2024-02-15 NOTE — ED Notes (Signed)
 Patient picked up by [place] law enforcement to be transported to [facility].

## 2024-02-15 NOTE — ED Notes (Signed)
 IVC PAPERWORK UPLOADED INTO PATIENTS CHART

## 2024-02-16 DIAGNOSIS — F201 Disorganized schizophrenia: Principal | ICD-10-CM

## 2024-02-16 DIAGNOSIS — F200 Paranoid schizophrenia: Principal | ICD-10-CM | POA: Diagnosis present

## 2024-02-16 LAB — HEMOGLOBIN A1C
Hgb A1c MFr Bld: 5.3 % (ref 4.8–5.6)
Mean Plasma Glucose: 105.41 mg/dL

## 2024-02-16 LAB — LIPID PANEL
Cholesterol: 131 mg/dL (ref 0–200)
HDL: 37 mg/dL — ABNORMAL LOW
LDL Cholesterol: 73 mg/dL (ref 0–99)
Total CHOL/HDL Ratio: 3.5 ratio
Triglycerides: 104 mg/dL
VLDL: 21 mg/dL (ref 0–40)

## 2024-02-16 LAB — TSH: TSH: 4.12 u[IU]/mL (ref 0.350–4.500)

## 2024-02-16 MED ORDER — ATORVASTATIN CALCIUM 10 MG PO TABS
20.0000 mg | ORAL_TABLET | Freq: Every day | ORAL | Status: DC
  Administered 2024-02-16 – 2024-02-25 (×10): 20 mg via ORAL
  Filled 2024-02-16 (×2): qty 2
  Filled 2024-02-16: qty 7
  Filled 2024-02-16 (×8): qty 2

## 2024-02-16 MED ORDER — ARIPIPRAZOLE 10 MG PO TABS
10.0000 mg | ORAL_TABLET | Freq: Every day | ORAL | Status: DC
  Administered 2024-02-16 – 2024-02-18 (×3): 10 mg via ORAL
  Filled 2024-02-16 (×3): qty 1

## 2024-02-16 MED ORDER — OLANZAPINE 10 MG PO TABS
10.0000 mg | ORAL_TABLET | Freq: Every day | ORAL | Status: DC
  Administered 2024-02-18 – 2024-02-20 (×3): 10 mg via ORAL
  Filled 2024-02-16 (×5): qty 1

## 2024-02-16 NOTE — Group Note (Signed)
"                                                 [facility] LCSW Group Therapy Note    Group Date: 02/16/2024 Start Time: 1000 End Time: 1100  Type of Therapy and Topic:  Group Therapy:  Overcoming Obstacles  Participation Level:  [facility] PARTICIPATION LEVEL: Did Not Attend  Mood:  Description of Group:   In this group patients will be encouraged to explore what they see as obstacles to their own wellness and recovery. They will be guided to discuss their thoughts, feelings, and behaviors related to these obstacles. The group will process together ways to cope with barriers, with attention given to specific choices patients can make. Each patient will be challenged to identify changes they are motivated to make in order to overcome their obstacles. This group will be process-oriented, with patients participating in exploration of their own experiences as well as giving and receiving support and challenge from other group members.  Therapeutic Goals: 1. Patient will identify personal and current obstacles as they relate to admission. 2. Patient will identify barriers that currently interfere with their wellness or overcoming obstacles.  3. Patient will identify feelings, thought process and behaviors related to these barriers. 4. Patient will identify two changes they are willing to make to overcome these obstacles:    Summary of Patient Progress   NA   Therapeutic Modalities:   Cognitive Behavioral Therapy Solution Focused Therapy Motivational Interviewing Relapse Prevention Therapy   Viola Kinnick O Denasia Venn, LCSWA "

## 2024-02-16 NOTE — Plan of Care (Signed)

## 2024-02-16 NOTE — BH Assessment (Signed)
(  Sleep Hours) - 7.25 (Any PRNs that were needed, meds refused, or side effects to meds)-  (Any disturbances and when (visitation, over night)- None (Concerns raised by the patient)- None (SI/HI/AVH)- Denies with noted confusion

## 2024-02-16 NOTE — BHH Counselor (Signed)
 Adult Comprehensive Assessment  Patient ID: Wesley Beck, male   DOB: 1970/09/13, 54 y.o.   MRN: [id number]  Information Source: Information source: Patient  Current Stressors:  Patient states their primary concerns and needs for treatment are:: I'm homeless and the family I thought I knew does not wish to assist me Patient states their goals for this hospitilization and ongoing recovery are:: I need a place to stay, I'm homeless, I need 8 years of peace Educational / Learning stressors: None reported Employment / Job issues: None reported Family Relationships: Pt reports stressors with his family, pt reports issues specifically with his father Surveyor, Quantity / Lack of resources (include bankruptcy): SSDI- 20 years Housing / Lack of housing: Pt reports that he is homeless, reports that his dad univited him to sleeping in the tool shed Physical health (include injuries & life threatening diseases): Pt reports asthma and foot pain, I deal with it Social relationships: I don't really have any Substance abuse: I smoke a little green herb on occassion Bereavement / Loss: All of them bother me  Living/Environment/Situation:  Living Arrangements: Parent Who else lives in the home?: Pt reports he lives in his dad's tool shed, that he rents from his dad How long has patient lived in current situation?: on and off 15 years What is atmosphere in current home: Chaotic  Family History:  Marital status: Single Are you sexually active?: No What is your sexual orientation?: straight Has your sexual activity been affected by drugs, alcohol, medication, or emotional stress?:  I don't focus on that right now Does patient have children?: Yes How many children?: 1 How is patient's relationship with their children?: They're so eager and willing to believe everything and anything untrue about me  Childhood History:  By whom was/is the patient raised?: Both parents Additional  childhood history information: Parents seperated and divorced when pt was age 35 and they shared joint custody. Pt reports his dad remarried when he was 47 years old. Pt reports his biological mom also got remarried Description of patient's relationship with caregiver when they were a child: Pt reports a strained relationship with his dad and step-mother. Pt reports his biological mother and he have a good relationship Patient's description of current relationship with people who raised him/her: Pt reports that he and his father have a strained relationship and he and his mother have a good relationship but reports he does not get to see her because his sister always tell him that she is sleeping How were you disciplined when you got in trouble as a child/adolescent?: Pt does not report Does patient have siblings?: Yes Number of Siblings: 1 Description of patient's current relationship with siblings: Pgt reports 1 sister, she does not think you can look at somebody and tell when they're lying Did patient suffer any verbal/emotional/physical/sexual abuse as a child?: No (not that I care to talk about) Did patient suffer from severe childhood neglect?: No Has patient ever been sexually abused/assaulted/raped as an adolescent or adult?: No Was the patient ever a victim of a crime or a disaster?: No Witnessed domestic violence?: Yes Has patient been affected by domestic violence as an adult?: No Description of domestic violence: Pt reports he does not want to disclose  Education:  Highest grade of school patient has completed: 11th grade then GED Currently a student?: No Learning disability?: No  Employment/Work Situation:   Employment Situation: On disability Why is Patient on Disability: Pt reports mental health How Long has Patient Been  on Disability: 20 years What is the Longest Time Patient has Held a Job?: off and on 30 years 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?: No  Financial Resources:   Financial resources: Insurance Claims Handler Does patient have a lawyer or guardian?: No  Alcohol/Substance Abuse:   What has been your use of drugs/alcohol within the last 12 months?: Pt reports he smokes marijuana If attempted suicide, did drugs/alcohol play a role in this?: No Alcohol/Substance Abuse Treatment Hx: Past Tx, Inpatient If yes, describe treatment and response: I don't remember Does patient live in an environment that promotes recovery or serves as an obstacle to recovery?: Yes - promotes recovery Describe how the environment promotes recovery or serves as an obstacle to recovery: Pt does not disclose Are others in the home using alcohol or other substances?: No Are significant others in the home willing to participate in the patient's care?: Yes Describe significant others willing to participate in the patient's care: Pt's father Has alcohol/substance abuse ever caused legal problems?: No  Social Support System:   Conservation Officer, Nature Support System: Poor Describe Community Support System: my granddaughter Type of faith/religion: I'm just straight up pagan, you can call me a Christian pagan or [employer] How does patient's faith help to cope with current illness?: I pray, meditate, I listen to music, I believe in the sublime, I believe in the afterlife  Leisure/Recreation:   Do You Have Hobbies?: Yes Leisure and Hobbies: walk through the woods, walk the street, I play video games on the Roku  Strengths/Needs:   What is the patient's perception of their strengths?: I'm not gonna turn the other cheek anymore, I will defend myself Patient states they can use these personal strengths during their treatment to contribute to their recovery: Pt does not report Patient states these barriers may affect/interfere with their treatment: None reported Patient states these barriers may  affect their return to the community: Pt reports he is homeless Other important information patient would like considered in planning for their treatment: Pt reports that he needs assistance with legal aid  Discharge Plan:   Currently receiving community mental health services: No Patient states concerns and preferences for aftercare planning are: It's something, I feel like I need before I leave Patient states they will know when they are safe and ready for discharge when: As of right now, when the clock spins to 2034, I'll consider that a possibility, unless I can be offered something kind of grounded, I need an apartment something of my own or I don't think I'll really be wanting to leave because I have nowhere to go for one thing Does patient have access to transportation?: No Does patient have financial barriers related to discharge medications?: No Patient description of barriers related to discharge medications: None reported Plan for no access to transportation at discharge: CSW to assist with transportation resources Will patient be returning to same living situation after discharge?: Yes  Summary/Recommendations:   Summary and Recommendations (to be completed by the evaluator): Patient is a 55 year-old male from [place], [place] [facility]). According to H&P, past psychiatric history of Schizoaffective bipolar disorder, polysubstance use. Patient initially arrived to [facility] on 1/8 for psychotic behavior, and admitted to [facility] under IVC on 1/9 for impaired functioning. PMHx is significant for Asthma, Back pain, HTN, HLD According to pt, his family is no longer supportive of him. Pt reports his main issues are  due to conflict with his father and step-mother. Pt reports his father told him he could no longer sleep in the tool shed that he was renting from him and told him to sleep in the backseat of the car. Pt reports that he is currently homeless and will not feel safe to leave  the hospital until he has somewhere to go. Pt reports that he does not want to speak with his family until 2034. Pt reports he has son, but reports they also have an estranged relationship. Pt reports marijuana use. Pt reports he would like therapy and psychiatry follow-up. Pt's primary diagnosis is Schizophrenia. Recommendations include: crisis stabilization, therapeutic milieu, encourage group attendance and participation, medication management for mood stabilization and development of comprehensive mental wellness/sobriety plan.  Lum JONETTA Croft. 02/16/2024

## 2024-02-16 NOTE — Plan of Care (Signed)

## 2024-02-16 NOTE — H&P (Addendum)
 [facility] Psychiatric Adult H&P  Patient Identification: Wesley Beck MRN:  [id number] Date of Evaluation:  02/16/2024 Chief Complaint:  Schizoaffective disorder (HCC) [F25.9] Principal Diagnosis: Schizophrenia, disorganized (HCC) Diagnosis:  Principal Problem:   Schizophrenia, disorganized (HCC)    Subjective  CC: Psychotic behavior  Wesley Beck is a 54 y.o. male  with a past psychiatric history of Schizoaffective bipolar disorder, polysubstance use. Patient initially arrived to [facility] on 1/8 for psychotic behavior, and admitted to [facility] under IVC on 1/9 for impaired functioning. PMHx is significant for Asthma, Back pain, HTN, HLD.   HPI:  Patient reports complaint of 15 years I've homeless and tired of it and continues to be very talkative about a poor relationship with his biological father, however he has had multiple father and mother figures throughout his life, how he thinks he needs 8 years of peace before he could return to live to his family's home where he will return in 2032.  He is having a lot of concerns with the multiple places he has tried to stay for shelter, including being kicked out of multiple group homes including the shelter in [place] a few nights ago.  Endorses having been diagnosed with schizoaffective bipolar disorder, and having been on Abilify  for over 10 years for that.  He says that that was helpful for him and he is unsure why it was stopped and he is now on Zyprexa  which he feels slows him down too much.  He has not been on the long-acting form of Abilify .   Past Psychiatric Hx: Previous Psychiatric Diagnoses: Schizoaffective Disorder, Schizophrenia, Polysubstance use disorder  Psychiatric Medications: Current Zyprexa  10mg  daily Past Abilify  Hydroxyzine  Risperdal  Ambien  Navane  Benztropine  Perphenazine Trazodone  Psychiatric Hospitalization hx: Multiple throughout [place]  Substance Use Hx: Alcohol: 20 years  sober Tobacco: 0.5ppd Cannabis: when I can afford it about once a month Other Substances: denies  Past Medical History: Medical Dx: Asthma, COPD, HTN, HLD, Seasonal Allergies Meds: Albuterol , Lipitor, Coreg , Claritin , Crestor , Trelegy Ellipta Allergies: Cats and dogs Hospitalizations: Dozens of ED visits   Family History: family history includes COPD in his mother; Cancer in his father and another family member; Hypertension in his father.   Social History: Current Living Situation: Homeless Occupation: None Spirituality/religiosity: Sherlean  Is the patient at risk to self? Yes.    Has the patient been a risk to self in the past 6 months? No.  Has the patient been a risk to self within the distant past? No.  Is the patient a risk to others? No.  Has the patient been a risk to others in the past 6 months? No.  Has the patient been a risk to others within the distant past? No.    Tobacco Screening:  Tobacco Use History[1]  BH Tobacco Counseling     Are you interested in Tobacco Cessation Medications?  No, patient refused Counseled patient on smoking cessation:  Refused/Declined practical counseling Reason Tobacco Screening Not Completed: Patient Refused Screening       Social History:  Social History   Substance and Sexual Activity  Alcohol Use No   Comment: social use     Social History   Substance and Sexual Activity  Drug Use Yes   Types: Marijuana      Objective  Labs  EKG monitoring: QTc:  Metabolism / endocrine: BMI: Body mass index is 29.89 kg/m.  CBC: unremarkable CMP: unremarkable UDS: + for THC Ethanol: <15 UA: unremarkable TSH: WNL A1c:  5.3 Lipid panel: Cholesterol 131, LDL 73, HDL 37  Mental Status Exam  Apperance: Appropriate for environment and Sitting upright Behavior: PSYCHOMOTOR AGITATION Speech: Fast (not pressured), Articulate, Normal Volume, and TALKATIVE Attitude: Cooperative and Friendly Mood: I have high  hopes Affect: ANXIOUS, Normal Range, and Mood INCONGRUENT Perception: Not responding to internal stimuli Thought Content: Delusions: Paranoid Thought Form: DISORGANIZED (mild), TANGENTIALITY, and PERSEVERATION Cognition: Alert & Oriented to person, place, and time, Recent and Remote memory grossly intact by recounting personal history, and Immediate memory grossly intact by interview Judgment: POOR Insight: POOR  Physical Exam: Physical Exam Vitals reviewed.  Constitutional:      General: He is not in acute distress.    Appearance: He is not toxic-appearing.  Pulmonary:     Effort: Pulmonary effort is normal. No tachypnea, bradypnea, accessory muscle usage, prolonged expiration or respiratory distress.  Skin:    General: Skin is warm and dry.  Neurological:     Mental Status: He is alert.     Gait: Gait normal.    Review of Systems  Constitutional: Negative.   Respiratory: Negative.    Cardiovascular: Negative.   Gastrointestinal: Negative.   Musculoskeletal:        Foot pain   Blood pressure 127/72, pulse 64, temperature 98.2 F (36.8 C), temperature source Oral, resp. rate 20, height 5' 9 (1.753 m), weight 91.8 kg, SpO2 92%. Body mass index is 29.89 kg/m.  Allergies:  Allergies[2] Lab Results:  Results for orders placed or performed during the hospital encounter of 02/15/24 (from the past 48 hours)  TSH     Status: None   Collection Time: 02/16/24  6:27 AM  Result Value Ref Range   TSH 4.120 0.350 - 4.500 uIU/mL    Comment: Performed at [facility], [address], [place], [place] [address]  Lipid panel     Status: Abnormal   Collection Time: 02/16/24  6:27 AM  Result Value Ref Range   Cholesterol 131 0 - 200 mg/dL    Comment:        ATP III CLASSIFICATION:  <200     mg/dL   Desirable  799-760  mg/dL   Borderline High  >=759    mg/dL   High           Triglycerides 104 <150 mg/dL   HDL 37 (L) >59 mg/dL   Total CHOL/HDL Ratio 3.5 RATIO    VLDL 21 0 - 40 mg/dL   LDL Cholesterol 73 0 - 99 mg/dL    Comment:        Total Cholesterol/HDL:CHD Risk Coronary Heart Disease Risk Table                     Men   Women  1/2 Average Risk   3.4   3.3  Average Risk       5.0   4.4  2 X Average Risk   9.6   7.1  3 X Average Risk  23.4   11.0        Use the calculated Patient Ratio above and the CHD Risk Table to determine the patient's CHD Risk.        ATP III CLASSIFICATION (LDL):  <100     mg/dL   Optimal  899-870  mg/dL   Near or Above                    Optimal  130-159  mg/dL   Borderline  160-189  mg/dL   High  >809     mg/dL   Very High Performed at [facility], [address], [place], [place] [address]   Hemoglobin A1c     Status: None   Collection Time: 02/16/24  6:27 AM  Result Value Ref Range   Hgb A1c MFr Bld 5.3 4.8 - 5.6 %    Comment: (NOTE) Diagnosis of Diabetes The following HbA1c ranges recommended by the American Diabetes Association (ADA) may be used as an aid in the diagnosis of diabetes mellitus.  Hemoglobin             Suggested A1C NGSP%              Diagnosis  <5.7                   Non Diabetic  5.7-6.4                Pre-Diabetic  >6.4                   Diabetic  <7.0                   Glycemic control for                       adults with diabetes.     Mean Plasma Glucose 105.41 mg/dL    Comment: Performed at [facility], [address], [place], [place] [address]    Blood Alcohol level:  Lab Results  Component Value Date   [facility] <15 02/14/2024   ETH <15 12/28/2023    Metabolic Disorder Labs:  Lab Results  Component Value Date   HGBA1C 5.3 02/16/2024   MPG 105.41 02/16/2024   MPG 93.93 10/08/2021   No results found for: PROLACTIN Lab Results  Component Value Date   CHOL 131 02/16/2024   TRIG 104 02/16/2024   HDL 37 (L) 02/16/2024   CHOLHDL 3.5 02/16/2024   VLDL 21 02/16/2024   LDLCALC 73 02/16/2024   LDLCALC 63 05/08/2022    [place]  Scale:  Flowsheet Row [facility] (Current) [facility] 02/14/2024 in [facility] at [facility] ED from 12/30/2023 in [facility] Department at [facility]  C-SSRS RISK CATEGORY Moderate Risk Moderate Risk No Risk    Current Medications: Current Facility-Administered Medications  Medication Dose Route Frequency Provider Last Rate Last Admin   acetaminophen  (TYLENOL ) tablet 650 mg  650 mg Oral Q6H PRN White, [name], NP   650 mg at 02/16/24 1303   albuterol  (VENTOLIN  HFA) 108 (90 Base) MCG/ACT inhaler 1-2 puff  1-2 puff Inhalation Q4H PRN White, [name], NP   2 puff at 02/16/24 0420   alum & mag hydroxide-simeth (MAALOX/MYLANTA) 200-200-20 MG/5ML suspension 30 mL  30 mL Oral Q4H PRN White, [name], NP       ARIPiprazole  (ABILIFY ) tablet 10 mg  10 mg Oral Daily Renisha Cockrum B, DO       atorvastatin  (LIPITOR) tablet 20 mg  20 mg Oral Daily Matteson Blue B, DO       budesonide -glycopyrrolate -formoterol  (BREZTRI ) 160-9-4.8 MCG/ACT inhaler 2 puff  2 puff Inhalation BID White, [name], NP   2 puff at 02/16/24 0949   carvedilol  (COREG ) tablet 3.125 mg  3.125 mg Oral BID WC White, [name], NP   3.125 mg at 02/16/24 [phone]  cloNIDine  (CATAPRES ) tablet 0.1 mg  0.1 mg Oral TID PRN Butler, Laura N, MD       haloperidol  (HALDOL ) tablet 5 mg  5 mg Oral TID PRN White, [name], NP       And   diphenhydrAMINE  (BENADRYL ) capsule 50 mg  50 mg Oral TID PRN White, [name], NP       haloperidol  lactate (HALDOL ) injection 5 mg  5 mg Intramuscular TID PRN White, [name], NP       And   diphenhydrAMINE  (BENADRYL ) injection 50 mg  50 mg Intramuscular TID PRN White, [name], NP       And   LORazepam  (ATIVAN ) injection 2 mg  2 mg Intramuscular TID PRN White, [name], NP       haloperidol  lactate (HALDOL ) injection 10 mg  10 mg Intramuscular TID PRN White, [name], NP       And   diphenhydrAMINE   (BENADRYL ) injection 50 mg  50 mg Intramuscular TID PRN White, [name], NP       And   LORazepam  (ATIVAN ) injection 2 mg  2 mg Intramuscular TID PRN White, [name], NP       hydrOXYzine  (ATARAX ) tablet 25 mg  25 mg Oral TID PRN White, [name], NP   25 mg at 02/15/24 2142   loratadine  (CLARITIN ) tablet 10 mg  10 mg Oral Daily PRN White, [name], NP       magnesium  hydroxide (MILK OF MAGNESIA) suspension 30 mL  30 mL Oral Daily PRN White, [name], NP       nicotine  (NICODERM CQ  - dosed in mg/24 hours) patch 21 mg  21 mg Transdermal Daily White, [name], NP   21 mg at 02/16/24 0949   OLANZapine  (ZYPREXA ) tablet 10 mg  10 mg Oral QHS Yicel Shannon B, DO       ondansetron  (ZOFRAN ) tablet 4 mg  4 mg Oral Q8H PRN White, [name], NP       PTA Medications: Medications Prior to Admission  Medication Sig Dispense Refill Last Dose/Taking   B Complex-C (B-COMPLEX WITH VITAMIN C) tablet Take 1 tablet by mouth daily.   Taking   Cholecalciferol 25 MCG (1000 UT) tablet Take 1,000 Units by mouth daily.   Taking   magnesium  oxide (MAG-OX) 400 (240 Mg) MG tablet Take 400 mg by mouth daily.   Taking   Multiple Vitamins-Minerals (MULTIVITAMIN WITH MINERALS) tablet Take 1 tablet by mouth daily.   Taking   albuterol  (VENTOLIN  HFA) 108 (90 Base) MCG/ACT inhaler Inhale 1-2 puffs into the lungs every 4 (four) hours as needed for wheezing or shortness of breath. 6.7 g 1    atorvastatin  (LIPITOR) 20 MG tablet Take 1 tablet (20 mg total) by mouth See admin instructions. Take it on Sunday, Tuesday, Wednesday, Thursday, and Saturday as needed for high cholesterol. 30 tablet 0    carvedilol  (COREG ) 3.125 MG tablet Take 1 tablet (3.125 mg total) by mouth 2 (two) times daily with a meal. 60 tablet 2    Coenzyme Q10 (COQ10 PO) Take 1 tablet by mouth 3 (three) times a week. Monday, Wednesday, and Friday      loratadine  (CLARITIN ) 10 MG tablet Take 1 tablet (10 mg total) by mouth daily as needed for allergies. 30  tablet 0    meloxicam (MOBIC) 7.5 MG tablet Take 7.5 mg by mouth 2 (two) times daily.      OLANZapine  (ZYPREXA ) 10 MG tablet Take 1  tablet (10 mg total) by mouth at bedtime. 90 tablet 3    rosuvastatin  (CRESTOR ) 10 MG tablet Take 10 mg by mouth 2 (two) times a week. Monday and Friday      thiamine  (VITAMIN B1) 100 MG tablet Take 100 mg by mouth daily.      TRELEGY ELLIPTA 100-62.5-25 MCG/ACT AEPB Take 1 puff by mouth daily.      zinc gluconate 50 MG tablet Take 50 mg by mouth daily.       Musculoskeletal: Strength & Muscle Tone: within normal limits Gait & Station: normal Patient leans: N/A  Psychiatric Specialty Exam:  Presentation  General Appearance:  Appropriate for Environment  Eye Contact: Fleeting  Speech: Clear and Coherent  Speech Volume: Normal  Handedness: Right   Mood and Affect  Mood: Anxious  Affect: Congruent; Appropriate   Thought Process  Thought Processes: Disorganized  Descriptions of Associations: Tangential  Orientation: Full (Time, Place and Person)  Thought Content: Perseveration  History of Schizophrenia/Schizoaffective disorder: No  Hallucinations:Hallucinations: None  Ideas of Reference: None  Suicidal Thoughts:Suicidal Thoughts: No  Homicidal Thoughts:Homicidal Thoughts: No   Sensorium  Memory: Immediate Fair; Recent Fair  Judgment: Poor  Insight: Poor   Executive Functions  Concentration: Fair  Attention Span: Fair  Recall: Fair  Fund of Knowledge: Fair  Language: Good   Psychomotor Activity  Psychomotor Activity:Psychomotor Activity: Restlessness   Assets  Assets: Communication Skills; Physical Health; Leisure Time; Resilience   Sleep  Sleep:Sleep: Poor    Assessment & Plan    Diagnoses / Active Problems:  Principal Problem:   Schizophrenia, disorganized (HCC)    Wesley Beck is a 54 y.o. male  with a past psychiatric history of Schizoaffective bipolar disorder,  polysubstance use. Patient initially arrived to [facility] on 1/8 for psychotic behavior, and admitted to [facility] under IVC on 1/9 for impaired functioning. PMHx is significant for Asthma, Back pain, HTN, HLD.   Pt presentation is clearly showing psychotic behavior. There is some perseveration on religious themes (which are within the realm of disorganized normalcy and not necessarily delusions) and family strife. Pt has a slightly higher level of energy which is suspected to be more from a psychotic disturbance opposed to mania. Unable to currently get sufficient history to differentiate bipolar components. Pt has self-reported schizoaffective disorder, bipolar type. Will watch closely for bipolar symptoms. Said that Abilify  worked well for him in the past, and that he doesn't like being on his current prescription of Zyprexa  because it slows him down too much. Switching from Zyprexa  to Abilify  with hopes for LAI by DC.  PLAN:  # Schizophrenia  - Start Abilify  10mg  PO daily   - Reports being at 30mg  daily in the past  - Continue Olanzapine  10mg  nightly   - Discontinue by discharge    Psychiatry General PRNs  -- Trazodone  50 mg once nightly as needed for insomnia  -- Hydroxyzine  25 mg 3 times daily as needed for anxiety  -- Agitation protocol: Haldol , Benadryl , lorazepam    Medical Issues Being Addressed:  # Asthma # COPD  - Continue home Breztri   - Continue home Albuterol   # HLD  - Continue home Atorvastatin  20mg  daily   # HTN  - Continue home Coreg  3.125mg   - Clonodine PRN  # Allergies  - Continue home Claratin   Medical General PRNs  - Tylenol  tablets 650 mg every 6 hours as needed for pain - Maalox/Mylanta suspension 30 mL every 4 hours as needed for indigestion -  Milk of Magnesia 30 mL daily as needed for constipation  Discharge Planning:              -- Social work and case management to assist with discharge planning and identification of hospital follow-up needs prior to  discharge             -- Estimated LOS: 7-10 days              -- Discharge Concerns: Need to establish a safety plan; Medication compliance and effectiveness             -- Discharge Goals: Return home with outpatient referrals for mental health follow-up including medication management/psychotherapy   Safety and Monitoring:             -- Involuntary admission to inpatient psychiatric unit for safety, stabilization and treatment             -- Daily contact with patient to assess and evaluate symptoms and progress in treatment             -- Patient's case to be discussed in multi-disciplinary team meeting             -- Observation Level : q15 minute checks             -- Vital signs:  q12 hours             -- Precautions: suicide, elopement, and assault  Group Therapy Encouraged patient to participate in unit milieu and in scheduled group therapies  -- Short Term Goals: Ability to identify changes in lifestyle to reduce recurrence of condition will improve, Ability to verbalize feelings will improve, Ability to disclose and discuss suicidal ideas, Ability to demonstrate self-control will improve, Ability to identify and develop effective coping behaviors will improve, Ability to maintain clinical measurements within normal limits will improve, Compliance with prescribed medications will improve, and Ability to identify triggers associated with substance abuse/mental health issues will improve. -- Long Term Goals: Improvement in symptoms so as ready for discharge.  The risks/benefits/side-effects/alternatives to medication were discussed in detail with the patient and time was given for questions. The patient consents to medication trial.  Metabolic profile and EKG monitoring obtained while on an atypical antipsychotic.              The patient did not present with any abnormal findings indicating the need for additional neurological or psychological testing.  I certify that inpatient  services furnished can reasonably be expected to improve the patient's condition.   CHARM Penne Mori Psychiatry  PGY-1 02/16/2024, 1:39 PM      [1]  Social History Tobacco Use  Smoking Status Every Day   Current packs/day: 0.50   Average packs/day: 0.5 packs/day for 25.0 years (12.5 ttl pk-yrs)   Types: Cigarettes   Passive exposure: Current  Smokeless Tobacco Never  [2]  Allergies Allergen Reactions   Cat Dander Itching and Other (See Comments)    Sneezing     Dog Epithelium (Canis Lupus Familiaris) Itching and Other (See Comments)    Sneezing

## 2024-02-16 NOTE — Group Note (Signed)
 Date:  02/16/2024 Time:  9:22 AM  Group Topic/Focus:  Goals Group:   The focus of this group is to help patients establish daily goals to achieve during treatment and discuss how the patient can incorporate goal setting into their daily lives to aide in recovery. Orientation:   The focus of this group is to educate the patient on the purpose and policies of crisis stabilization and provide a format to answer questions about their admission.  The group details unit policies and expectations of patients while admitted.    Participation Level:  Did Not Attend

## 2024-02-16 NOTE — Group Note (Signed)
 Date:  02/16/2024 Time:  6:16 PM  Group Topic/Focus:  Addiction and how our neurotransmitters are affected and the effects of them are manifested. Alternatives to getting illicit dopamine hits, natural ways to do so.   Participation Level:  Active  Participation Quality:  Appropriate  Affect:  Appropriate  Cognitive:  Appropriate  Insight: Appropriate  Engagement in Group:  Engaged  Modes of Intervention:  Discussion and Education  Additional Comments:    Wesley Beck 02/16/2024, 6:16 PM

## 2024-02-16 NOTE — [facility] Group Notes (Signed)
 Adult Psychoeducational Group Note  Date:  02/16/2024 Time:  11:14 PM  Group Topic/Focus:  Wrap-Up Group:   The focus of this group is to help patients review their daily goal of treatment and discuss progress on daily workbooks.  Participation Level:  Did Not Attend  Participation Quality:  did not attend  Affect:  did not attend  Cognitive:  did not attend  Insight: None  Engagement in Group:  did not attend  Modes of Intervention:  did not attend  Additional Comments:  did not attend  Lang Drilling Long 02/16/2024, 11:14 PM

## 2024-02-16 NOTE — [facility] Suicide Risk Assessment (Signed)
 Suicide Risk Assessment  Admission Assessment    [facility] Admission Suicide Risk Assessment   Nursing information obtained from:  Patient Demographic factors:  Low socioeconomic status, Male, Living alone Current Mental Status:  NA Loss Factors:  Financial problems / change in socioeconomic status Historical Factors:  Prior suicide attempts Risk Reduction Factors:  Sense of responsibility to family   Principal Problem: Schizophrenia, disorganized (HCC) Diagnosis:  Principal Problem:   Schizophrenia, disorganized (HCC)  Subjective Data:  CC: Psychotic behavior   Wesley Beck is a 54 y.o. male  with a past psychiatric history of Schizoaffective bipolar disorder, polysubstance use. Patient initially arrived to [facility] on 1/8 for psychotic behavior, and admitted to [facility] under IVC on 1/9 for impaired functioning. PMHx is significant for Asthma, Back pain, HTN, HLD.    HPI:  Patient reports complaint of 15 years I've homeless and tired of it and continues to be very talkative about a poor relationship with his biological father, however he has had multiple father and mother figures throughout his life, how he thinks he needs 8 years of peace before he could return to live to his family's home where he will return in 2032.  He is having a lot of concerns with the multiple places he has tried to stay for shelter, including being kicked out of multiple group homes including the shelter in [place] a few nights ago.  Endorses having been diagnosed with schizoaffective bipolar disorder, and having been on Abilify  for over 10 years for that.  He says that that was helpful for him and he is unsure why it was stopped and he is now on Zyprexa  which he feels slows him down too much.  He has not been on the long-acting form of Abilify .    Past Psychiatric Hx: Previous Psychiatric Diagnoses: Schizoaffective Disorder, Schizophrenia, Polysubstance use disorder  Psychiatric Medications: Current Zyprexa   10mg  daily Past Abilify  Hydroxyzine  Risperdal  Ambien  Navane  Benztropine  Perphenazine Trazodone  Psychiatric Hospitalization hx: Multiple throughout [place]   Substance Use Hx: Alcohol: 20 years sober Tobacco: 0.5ppd Cannabis: when I can afford it about once a month Other Substances: denies   Past Medical History: Medical Dx: Asthma, COPD, HTN, HLD, Seasonal Allergies Meds: Albuterol , Lipitor, Coreg , Claritin , Crestor , Trelegy Ellipta Allergies: Cats and dogs Hospitalizations: Dozens of ED visits    Family History: family history includes COPD in his mother; Cancer in his father and another family member; Hypertension in his father.    Social History: Current Living Situation: Homeless Occupation: None Spirituality/religiosity: Christian  Continued Clinical Symptoms:  Alcohol Use Disorder Identification Test Final Score (AUDIT): 0 The Alcohol Use Disorders Identification Test, Guidelines for Use in Primary Care, Second Edition.  World Science Writer [facility]). Score between 0-7:  no or low risk or alcohol related problems. Score between 8-15:  moderate risk of alcohol related problems. Score between 16-19:  high risk of alcohol related problems. Score 20 or above:  warrants further diagnostic evaluation for alcohol dependence and treatment.   CLINICAL FACTORS:   Currently Psychotic   Musculoskeletal: Strength & Muscle Tone: within normal limits Gait & Station: normal Patient leans: N/A  Psychiatric Specialty Exam:  Presentation  General Appearance:  Appropriate for Environment  Eye Contact: Fleeting  Speech: Clear and Coherent  Speech Volume: Normal  Handedness: Right   Mood and Affect  Mood: Anxious  Affect: Congruent; Appropriate   Thought Process  Thought Processes: Disorganized  Descriptions of Associations:Tangential  Orientation:Full (Time, Place and Person)  Thought Content:Perseveration  History  of  Schizophrenia/Schizoaffective disorder:No  Duration of Psychotic Symptoms:Greater than six months  Hallucinations:Hallucinations: None  Ideas of Reference:None  Suicidal Thoughts:Suicidal Thoughts: No  Homicidal Thoughts:Homicidal Thoughts: No   Sensorium  Memory: Immediate Fair; Recent Fair  Judgment: Poor  Insight: Poor   Executive Functions  Concentration: Fair  Attention Span: Fair  Recall: Fair  Fund of Knowledge: Fair  Language: Good   Psychomotor Activity  Psychomotor Activity: Psychomotor Activity: Restlessness   Assets  Assets: Communication Skills; Physical Health; Leisure Time; Resilience   Sleep  Sleep: Sleep: Poor    Physical Exam: Physical Exam Vitals reviewed.  Constitutional:      General: He is not in acute distress.    Appearance: He is not toxic-appearing.  Pulmonary:     Effort: Pulmonary effort is normal. No respiratory distress.  Skin:    General: Skin is warm and dry.  Neurological:     Mental Status: He is alert and oriented to person, place, and time.     Gait: Gait normal.    Review of Systems  Constitutional: Negative.   Respiratory: Negative.    Cardiovascular: Negative.   Gastrointestinal: Negative.    Blood pressure 127/72, pulse 64, temperature 98.2 F (36.8 C), temperature source Oral, resp. rate 20, height 5' 9 (1.753 m), weight 91.8 kg, SpO2 92%. Body mass index is 29.89 kg/m.   COGNITIVE FEATURES THAT CONTRIBUTE TO RISK:  Thought constriction (tunnel vision)    SUICIDE RISK:   Mild:  Pt is disorganized to the point of neglect. Has potential to worsen to include self-destructive behabvior, but neglect is most likely. Denies SI.  PLAN OF CARE:  - Admit to inpatient care - See H&P for more complete plan  I certify that inpatient services furnished can reasonably be expected to improve the patient's condition.   Penne Mori, DO, PGY1 02/16/2024, 1:39 PM

## 2024-02-16 NOTE — [facility] Suicide Risk Assessment (Signed)
 [facility] INPATIENT:  Family/Significant Other Suicide Prevention Education  Suicide Prevention Education:  Patient Refusal for Family/Significant Other Suicide Prevention Education: The patient Wesley Beck has refused to provide written consent for family/significant other to be provided Family/Significant Other Suicide Prevention Education during admission and/or prior to discharge.  Physician notified.  Wesley Beck 02/16/2024, 10:35 AM

## 2024-02-17 MED ORDER — NAPHAZOLINE-GLYCERIN 0.012-0.25 % OP SOLN
1.0000 [drp] | Freq: Four times a day (QID) | OPHTHALMIC | Status: DC | PRN
  Administered 2024-02-21 – 2024-02-24 (×3): 2 [drp] via OPHTHALMIC
  Filled 2024-02-17: qty 15

## 2024-02-17 MED ORDER — HYDROCORTISONE 1 % EX CREA
TOPICAL_CREAM | Freq: Three times a day (TID) | CUTANEOUS | Status: DC
  Administered 2024-02-22 – 2024-02-24 (×4): 1 via TOPICAL
  Filled 2024-02-17 (×2): qty 28

## 2024-02-17 MED ORDER — AQUAPHOR EX OINT
TOPICAL_OINTMENT | Freq: Every day | CUTANEOUS | Status: DC | PRN
  Administered 2024-02-22: 1 via TOPICAL
  Filled 2024-02-17: qty 50

## 2024-02-17 NOTE — Group Note (Signed)
 Date:  02/17/2024 Time:  6:17 PM  Group Topic/Focus:  Goals Group:   The focus of this group is to help patients establish daily goals to achieve during treatment and discuss how the patient can incorporate goal setting into their daily lives to aide in recovery. Orientation:   The focus of this group is to educate the patient on the purpose and policies of crisis stabilization and provide a format to answer questions about their admission.  The group details unit policies and expectations of patients while admitted.    Participation Level:  Did Not Attend   Wesley Beck 02/17/2024, 6:17 PM

## 2024-02-17 NOTE — Group Note (Addendum)
 Date:  02/17/2024 Time:  11:11 AM  Group Topic/Focus:   Emotional Education:   The focus of this group is to increase self-awareness, emotional expression, autonomy, and self esteem through discussion on control and a lyric substitution activity.   Participation Level:  Active  Participation Quality:  Appropriate and Sharing  Affect:  Appropriate  Cognitive:  Appropriate  Insight: Appropriate  Engagement in Group:  Engaged  Modes of Intervention:  Activity and Discussion  Additional Comments:    Lisa Milian D Charne Mcbrien 02/17/2024, 11:11 AM

## 2024-02-17 NOTE — Progress Notes (Signed)
(  Sleep Hours) -7.75 (Any PRNs that were needed, meds refused, or side effects to meds)-  (Any disturbances and when (visitation, over night)- N/A (Concerns raised by the patient)- none (SI/HI/AVH)- Denies

## 2024-02-17 NOTE — Plan of Care (Signed)
   Problem: Education: Goal: Knowledge of [facility] Education information/materials will improve Outcome: Progressing   Problem: Activity: Goal: Interest or engagement in activities will improve Outcome: Progressing   Problem: Coping: Goal: Ability to verbalize frustrations and anger appropriately will improve Outcome: Progressing   Problem: Safety: Goal: Periods of time without injury will increase Outcome: Progressing

## 2024-02-17 NOTE — Group Note (Signed)
 Date:  02/17/2024 Time:  7:29 PM  Group Topic/Focus:  Healthy Relationships - Description of what a healthy relationship looks like and how each person needs to be active in the relationship.     Participation Level:  Did Not Attend   Juliene CHRISTELLA Huddle 02/17/2024, 7:29 PM

## 2024-02-17 NOTE — Group Note (Signed)
 Date:  02/17/2024 Time:  4:02 PM  Group Topic/Focus:  Physical Wellness:   This group focuses on introducing patients to Progressive Muscle Relaxation, a technique that involves tensing and then slowly releasing different muscle groups in the body. The goal is to help individuals become more aware of physical tension and stress in their body, and to learn how to consciously release it .   Participation Level:  Active  Participation Quality:  Appropriate  Affect:  Appropriate  Cognitive:  Appropriate  Insight: Appropriate  Engagement in Group:  Limited  Modes of Intervention:  Activity  Additional Comments:  Pt was removed from group by a treatment team member, so participation and engagement were limited.  Arlissa Monteverde D Jes Costales 02/17/2024, 4:02 PM

## 2024-02-17 NOTE — Progress Notes (Signed)
 [facility] Inpatient Psychiatry Progress Note  Date: 02/17/2024 Patient: Wesley Beck MRN: [id number]  Assessment and Plan: GILMAN OLAZABAL is a 54 y.o. male a past psychiatric history of Schizoaffective bipolar disorder, polysubstance use. Patient initially arrived to [facility] on 1/8 for psychotic behavior, and admitted to [facility] under IVC on 1/9 for impaired functioning. PMHx is significant for Asthma, Back pain, HTN, HLD.   Patient continues to be perseverative, paranoid towards family, delusional, and mildly to disorganized. He continues to have unrealistic expectations for what inpatient hospitalization can assist him with. Patient refused zyprexa  and states that he wants to be on Abilify  monotherapy. Patient has moderate eczema on face and neck causing irritation, will order cream.   # Schizophrenia             - Continue Abilify  10mg  PO daily                         - Reports being at 30mg  daily in the past             - Continue Olanzapine  10mg  nightly                         - Discontinue by discharge   Psychiatry General PRNs             -- Trazodone  50 mg once nightly as needed for insomnia             -- Hydroxyzine  25 mg 3 times daily as needed for anxiety             -- Agitation protocol: Haldol , Benadryl , lorazepam      Medical Issues Being Addressed:  #Eczema  - Start hydrocortisone  1% cream PRN for skin irritation  - Start Aquaphor PRN for skin irritation  # Asthma # COPD             - Continue home Breztri              - Continue home Albuterol    # HLD             - Continue home Atorvastatin  20mg  daily              # HTN             - Continue home Coreg  3.125mg              - Clonodine PRN   # Allergies             - Continue home Claratin  - Start Clear Eyes Redness eye drops for eye irritation   Medical General PRNs             - Tylenol  tablets 650 mg every 6 hours as needed for pain - Maalox/Mylanta suspension 30 mL every 4  hours as needed for indigestion - Milk of Magnesia 30 mL daily as needed for constipation      Risk Assessment -   Discharge Planning Barriers to discharge: Estimated length of stay:  Predicted Discharge location:      Interval History and update:   02/17/2024 No significant events overnight. Vital Signs are Stable. MAR was reviewed and patient was not compliant with medications yesterday. They received PRN atarax  yesterday. They spent 50% of their time in the bedroom over the last 24 hours. Case was discussed in the multidisciplinary team.   On interview, patient speaks at length about  perceived betrayal by his hometown and states he requires 8 years of peace before he could return to live near his family. He denies SI, HI, an AVH. He remains paranoid and endorses fixed delusional beliefs regarding his family.  Physical exam notable for erythema and excoriations involving the face, eyes, and neck. Patient reports a history of eczema and requested topical treatment and eye drops.  Discussed plan with patient to which they were agreeable. Pt questions were answered.    Physical Exam MSK/Neuro - Normal gait and station   Mental Status Exam  Apperance: BIZARRE and Standing Behavior: Calm Speech: Normal Rate, Articulate, Normal Volume, and Responsive Attitude: Cooperative and Friendly Mood: I'm irritated Affect: Euthymic, RESTRICTED, and Mood Congruent Perception: Not responding to internal stimuli Thought Content: Delusions: Paranoid and Persecutory Thought Form: DISORGANIZED (moderate), Linear, and Logical Cognition: Alert & Oriented to person, place, and time, Recent and Remote memory grossly intact by recounting personal history, and Immediate memory grossly intact by interview Judgment: POOR Insight: POOR      Lab Results:  Admission on 02/15/2024  Component Date Value Ref Range Status   TSH 02/16/2024 4.120  0.350 - 4.500 uIU/mL Final   Cholesterol 02/16/2024  131  0 - 200 mg/dL Final   Triglycerides 98/89/7973 104  <150 mg/dL Final   HDL 98/89/7973 37 (L)  >40 mg/dL Final   Total CHOL/HDL Ratio 02/16/2024 3.5  RATIO Final   VLDL 02/16/2024 21  0 - 40 mg/dL Final   LDL Cholesterol 02/16/2024 73  0 - 99 mg/dL Final   Hgb J8r MFr Bld 02/16/2024 5.3  4.8 - 5.6 % Final   Mean Plasma Glucose 02/16/2024 105.41  mg/dL Final    Qtc: 428ms   Vitals: Blood pressure (!) 149/100, pulse 67, temperature (!) 97.4 F (36.3 C), temperature source Oral, resp. rate 20, height 5' 9 (1.753 m), weight 91.8 kg, SpO2 96%.    Alan Maiden, MD PGY-1  [facility] Psychiatry

## 2024-02-17 NOTE — Progress Notes (Signed)
 D:  Patient's self inventory sheet, patient sleeps good, sleep medication helpful.  Good appetite, normal energy level, good concentration.  Denied depression and hopeless, rated anxiety 5.  Denied withdrawals.  Denied SI.  Denied physical problems.  Physical pain, foot pain, worst pain #3 in past 24 hours.  Pain medicine helpful.  Goal is talk to MD and SW.  Plans to talk.  Will discuss resources and meds.   A:  Medications administered per MD orders.  Emotional support and encouragement. R:  Denied SI and HI, contracts for safety.  Denied A/V hallucinations.  Safety maintained with 15 minute checks.

## 2024-02-17 NOTE — Plan of Care (Signed)
 Nurse discussed anxiety, depression and coping skills with patient.

## 2024-02-17 NOTE — [facility] Group Notes (Signed)
 [facility] Group Notes:  (Nursing/MHT/Case Management/Adjunct)  Date:  02/17/2024  Time:  10:38 PM  Type of Therapy:  Psychoeducational Skills  Participation Level:  Did Not Attend  Participation Quality:  Did not attend.  Affect:  Did not attend.  Cognitive:  Did not attend.   Insight:  None  Engagement in Group:  Did not attend.   Modes of Intervention:  Education  Summary of Progress/Problems: Patient did not attend group.   Maely Clements S 02/17/2024, 10:38 PM

## 2024-02-18 DIAGNOSIS — F209 Schizophrenia, unspecified: Secondary | ICD-10-CM

## 2024-02-18 MED ORDER — ARIPIPRAZOLE 15 MG PO TABS
15.0000 mg | ORAL_TABLET | Freq: Every day | ORAL | Status: DC
  Administered 2024-02-19: 15 mg via ORAL
  Filled 2024-02-18: qty 1

## 2024-02-18 NOTE — Group Note (Signed)
 Occupational Therapy Group Note  Group Topic: Sleep Hygiene  Group Date: 02/18/2024 Start Time: 1500 End Time: 1530 Facilitators: Dot [place] MATSU, OT   Group Description: Group encouraged increased participation and engagement through topic focused on sleep hygiene. Patients reflected on the quality of sleep they typically receive and identified areas that need improvement. Group was given background information on sleep and sleep hygiene, including common sleep disorders. Group members also received information on how to improve ones sleep and introduced a sleep diary as a tool that can be utilized to track sleep quality over a length of time. Group session ended with patients identifying one or more strategies they could utilize or implement into their sleep routine in order to improve overall sleep quality.        Therapeutic Goal(s):  Identify one or more strategies to improve overall sleep hygiene  Identify one or more areas of sleep that are negatively impacted (sleep too much, too little, etc)     Participation Level: Engaged   Participation Quality: Independent   Behavior: Appropriate   Speech/Thought Process: Relevant   Affect/Mood: Appropriate   Insight: Fair   Judgement: Fair      Modes of Intervention: Education  Patient Response to Interventions:  Attentive   Plan: Continue to engage patient in OT groups 2 - 3x/week.  02/18/2024  [place] MATSU Dot, OT  Lamone Ferrelli, OT

## 2024-02-18 NOTE — [facility] Group Notes (Signed)
 Adult Psychoeducational Group Note  Date:  02/18/2024 Time:  5:21 PM  Group Topic/Focus: Occupational Therapy  Participation Level:  Active  Participation Quality:  Attentive  Affect:  Appropriate  Cognitive:  Appropriate  Insight: Appropriate  Engagement in Group:  Engaged  Modes of Intervention:  Discussion  Additional Comments:    Ronnell Puller 02/18/2024, 5:21 PM

## 2024-02-18 NOTE — Group Note (Signed)
 Date:  02/18/2024 Time:  8:54 AM  Group Topic/Focus:  Goals Group:   The focus of this group is to help patients establish daily goals to achieve during treatment and discuss how the patient can incorporate goal setting into their daily lives to aide in recovery.    Participation Level:  Active  Participation Quality:  Appropriate  Affect:  Appropriate  Cognitive:  Appropriate  Insight: Appropriate  Engagement in Group:  Engaged  Modes of Intervention:  Education  Additional Comments:  Pt goal of there day is to discuss plan to put in place after treatment.  Murphy JONELLE Schlichter 02/18/2024, 8:54 AM

## 2024-02-18 NOTE — Progress Notes (Signed)
 Spiritual care group on grief and loss facilitated by Chaplain Rockie Sofia, [facility]  Group Goal: Support / Education around grief and loss  Members engage in facilitated group support and psycho-social education.  Group Description:  Following introductions and group rules, group members engaged in facilitated group dialogue and support around topic of loss, with particular support around experiences of loss in their lives. Group Identified types of loss (relationships / self / things) and identified patterns, circumstances, and changes that precipitate losses. Reflected on thoughts / feelings around loss, normalized grief responses, and recognized variety in grief experience. Group encouraged individual reflection on safe space and on the coping skills that they are already utilizing.  Group drew on Adlerian / Rogerian and narrative framework  Patient Progress: Wesley Beck attended group and actively engaged and participated in group conversation and activities.

## 2024-02-18 NOTE — Progress Notes (Signed)
 [facility] Inpatient Psychiatry Progress Note  Date: 02/18/2024 Patient: Wesley Beck MRN: [id number]  Assessment and Plan: Wesley Beck is a 54 y.o. male a past psychiatric history of Schizoaffective bipolar disorder, polysubstance use. Patient initially arrived to [facility] on 1/8 for psychotic behavior, and admitted to [facility] under IVC on 1/9 for impaired functioning. PMHx is significant for Asthma, Back pain, HTN, HLD.   Patient continues to be perseverative, paranoid towards family, delusional, and mildly to disorganized. He continues to have unrealistic expectations for what inpatient hospitalization can assist him with. Patient refused zyprexa  and states that he wants to be on Abilify  monotherapy. Patient has moderate eczema on face and neck causing irritation. Hoping to get pt stable on an Abilify  LAI monotherapy. Using Zyprexa  and Abilify  initially with intent to discontinue Zyprexa  prior to DC once Abilify  dose is high enough; his last Abilify  stable dose was 30mg  daily. Speech has been improving with medications so far, but still mildly disorganized.  # Schizophrenia             - Increasing Abilify  to 15mg  PO daily                         - Reports being at 30mg  daily in the past             - Continue Olanzapine  10mg  nightly                         - Discontinue by discharge   Psychiatry General PRNs             -- Trazodone  50 mg once nightly as needed for insomnia             -- Hydroxyzine  25 mg 3 times daily as needed for anxiety             -- Agitation protocol: Haldol , Benadryl , lorazepam      Medical Issues Being Addressed:  #Eczema  - Continue hydrocortisone  1% cream PRN for skin irritation  - Continue Aquaphor PRN for skin irritation  # Asthma # COPD             - Continue home Breztri              - Continue home Albuterol    # HLD             - Continue home Atorvastatin  20mg  daily              # HTN             - Continue home  Coreg  3.125mg              - Clonodine PRN   # Allergies             - Continue home Claratin  - Continue Clear Eyes Redness eye drops for eye irritation   Medical General PRNs             - Tylenol  tablets 650 mg every 6 hours as needed for pain - Maalox/Mylanta suspension 30 mL every 4 hours as needed for indigestion - Milk of Magnesia 30 mL daily as needed for constipation    Risk Assessment - mild. Is denying SI, but is still disorganized.  Discharge Planning Barriers to discharge: Active psychosis Estimated length of stay: 7-10 days Predicted Discharge location: Shelter   Interval History and update:   02/18/2024 No significant  events overnight. Vital Signs are Stable. MAR was reviewed and patient was not compliant with Zyprexa  yesterday. They received  PRN Claritin , clonidine , and albuterol  yesterday. They spent 50% of their time in the bedroom over the last 24 hours. Case was discussed in the multidisciplinary team.   On interview patient reports that he is feeling more hopefull because being here is out of his family's way and they are likely happy that he is gone. Reports that he would be willing to be on both Zyprexa  and abilify  for now, but would like to be on Abilify  monotherapy for the long term if possible. Denies SI, HI, AH, VH.  Discussed plan with patient to which they were agreeable. Pt questions were answered.  Physical Exam MSK/Neuro - Normal gait and station   Mental Status Exam Apperance: Appropriate for environment, Sitting upright, and Standing Behavior: Calm Speech: Normal Rate, Articulate, Normal Volume, and TALKATIVE Attitude: Cooperative and Friendly Mood: more hopeful Affect: BLUNTED and Mood Congruent Perception: Not responding to internal stimuli Thought Content: within normal limits Thought Form: Goal Directed, DISORGANIZED (mild), Logical, and PERSEVERATION Cognition: Alert & Oriented to person, place, and time, Recent and Remote memory  grossly intact by recounting personal history, and Immediate memory grossly intact by interview Judgment: POOR Insight: POOR   Lab Results:  Admission on 02/15/2024  Component Date Value Ref Range Status   TSH 02/16/2024 4.120  0.350 - 4.500 uIU/mL Final   Cholesterol 02/16/2024 131  0 - 200 mg/dL Final   Triglycerides 98/89/7973 104  <150 mg/dL Final   HDL 98/89/7973 37 (L)  >40 mg/dL Final   Total CHOL/HDL Ratio 02/16/2024 3.5  RATIO Final   VLDL 02/16/2024 21  0 - 40 mg/dL Final   LDL Cholesterol 02/16/2024 73  0 - 99 mg/dL Final   Hgb J8r MFr Bld 02/16/2024 5.3  4.8 - 5.6 % Final   Mean Plasma Glucose 02/16/2024 105.41  mg/dL Final    Qtc: 428ms   Vitals: Blood pressure 131/89, pulse 69, temperature (!) 97.3 F (36.3 C), temperature source Oral, resp. rate 20, height 5' 9 (1.753 m), weight 91.8 kg, SpO2 95%.   DSABRA Penne Mori, DO, PGY1 [facility] Psychiatry Residency Program

## 2024-02-18 NOTE — Progress Notes (Signed)
(  Sleep Hours) 8 (Any PRNs that were needed, meds refused, or side effects to meds)- clonidine , loratadine  (Any disturbances and when (visitation, over night)- n/a (Concerns raised by the patient)- none (SI/HI/AVH)- Denies

## 2024-02-18 NOTE — Plan of Care (Signed)
   Problem: Education: Goal: Knowledge of [facility] Education information/materials will improve Outcome: Progressing Goal: Emotional status will improve Outcome: Progressing Goal: Mental status will improve Outcome: Progressing Goal: Verbalization of understanding the information provided will improve Outcome: Progressing

## 2024-02-18 NOTE — Plan of Care (Signed)
   Problem: Education: Goal: Emotional status will improve Outcome: Not Progressing Goal: Mental status will improve Outcome: Not Progressing

## 2024-02-18 NOTE — Group Note (Signed)
 Recreation Therapy Group Note   Group Topic:Stress Management  Group Date: 02/18/2024 Start Time: 9056 End Time: 1058 Facilitators: Win Guajardo-McCall, LRT,CTRS Location: 400 Hall Dayroom   Group Topic: Stress Management  Goal Area(s) Addresses:  Patient will identify positive stress management techniques. Patient will identify benefits of using stress management post d/c.  Behavioral Response: Engaged  Intervention: Let's Meditate App  Activity: LRT played a meditation called Turning the Page. The meditation encouraged patients to reflect on the past year, acknowledge their journey and everyday take each breath as a new step in towards their future goals.    Education:  Stress Management, Discharge Planning.   Education Outcome: Acknowledges Education   Affect/Mood: Appropriate   Participation Level: Engaged   Participation Quality: Independent   Behavior: Attentive    Speech/Thought Process: Focused   Insight: Good   Judgement: Good   Modes of Intervention: Meditation   Patient Response to Interventions:  Attentive   Education Outcome:  In group clarification offered    Clinical Observations/Individualized Feedback: Pt attended and participated in group session.     Plan: Continue to engage patient in RT group sessions 2-3x/week.   Alaja Goldinger-McCall, LRT,CTRS 02/18/2024 12:54 PM

## 2024-02-18 NOTE — Group Note (Signed)
 Date:  02/18/2024 Time:  10:06 AM  Patient participated in  group meditation during rec therapy.     Participation Level:  Active    Wesley Beck 02/18/2024, 10:06 AM

## 2024-02-18 NOTE — Progress Notes (Signed)
" °   02/18/24 1200  Psych Admission Type (Psych Patients Only)  Admission Status Involuntary  Psychosocial Assessment  Patient Complaints Anxiety;Irritability  Eye Contact Fair  Facial Expression Animated  Affect Preoccupied  Speech Pressured  Interaction Assertive  Motor Activity Restless  Appearance/Hygiene Disheveled  Behavior Characteristics Cooperative  Mood Anxious  Thought Process  Coherency Disorganized  Content Preoccupation  Delusions None reported or observed  Perception WDL  Hallucination None reported or observed  Judgment Poor  Confusion None  Danger to Self  Current suicidal ideation? Denies  Danger to Others  Danger to Others None reported or observed   DAR NOTE: Patient presents with anxious affect and irritable mood.  Denies suicidal thoughts, auditory and visual hallucinations.  Rates depression at 1, hopelessness at 3, and anxiety at 0.  Maintained on routine safety checks.  Vistaril  25 mg given for complain of anxiety with good effect.  Medications given as prescribed.  Support and encouragement offered as needed.  Attended group and participated.  States goal for today is home plan.  Patient observed socializing with peers in the dayroom.  Patient is safe on and off the unit.    "

## 2024-02-18 NOTE — Group Note (Unsigned)
 Date:  02/18/2024 Time:  8:32 PM  Group Topic/Focus:  Wrap-Up Group:   The focus of this group is to help patients review their daily goal of treatment and discuss progress on daily workbooks.     Participation Level:  {BHH PARTICIPATION OZCZO:77735}  Participation Quality:  {BHH PARTICIPATION QUALITY:22265}  Affect:  {BHH AFFECT:22266}  Cognitive:  {BHH COGNITIVE:22267}  Insight: {BHH Insight2:20797}  Engagement in Group:  {BHH ENGAGEMENT IN HMNLE:77731}  Modes of Intervention:  {BHH MODES OF INTERVENTION:22269}  Additional Comments:  ***  Gwenn Nobie [place] 02/18/2024, 8:32 PM

## 2024-02-18 NOTE — Progress Notes (Signed)
 Wesley Beck   Type of Note: Resources  Patient was given lists of shelters/transitional housing in the [facility] and surrounding areas. Pt also given food bank/hot meal list for [facility]. Pt declined food resources for [employer]. Pt fixated on his father and mother and being kicked out of their shed where he was living. Pt also fixated on religion and the importance of not seeing his family until 2034.   Pt states he would like staff to assist him with finding an apartment prior to discharge that only costs $400/mo at the most. Did mention having a friend who may let him stay at his house in return for helping him with roofing project.   Pt thanked this clinical research associate for resources.   Signed:  Candid Bovey, LCSW-A 02/18/2024  2:16 PM

## 2024-02-18 NOTE — BHH Group Notes (Signed)
 Adult Psychoeducational Group Note  Date:  02/18/2024 Time:  3:42 PM  Group Topic/Focus: Physical Wellness  Participation Level:  Active  Participation Quality:  Appropriate  Affect:  Appropriate  Cognitive:  Appropriate  Insight: Appropriate  Engagement in Group:  Engaged  Modes of Intervention:  Activity  Additional Comments:    Ronnell Puller 02/18/2024, 3:42 PM

## 2024-02-19 ENCOUNTER — Encounter (HOSPITAL_COMMUNITY): Payer: Self-pay

## 2024-02-19 MED ORDER — LORATADINE 10 MG PO TABS
10.0000 mg | ORAL_TABLET | Freq: Every day | ORAL | Status: DC
  Administered 2024-02-19 – 2024-02-25 (×7): 10 mg via ORAL
  Filled 2024-02-19 (×4): qty 1
  Filled 2024-02-19: qty 7
  Filled 2024-02-19 (×2): qty 1

## 2024-02-19 MED ORDER — ARIPIPRAZOLE 10 MG PO TABS
20.0000 mg | ORAL_TABLET | Freq: Every day | ORAL | Status: DC
  Administered 2024-02-20 – 2024-02-25 (×6): 20 mg via ORAL
  Filled 2024-02-19 (×6): qty 2
  Filled 2024-02-19: qty 14

## 2024-02-19 NOTE — Progress Notes (Signed)
(  Sleep Hours) -6.0 as of 0530 (Any PRNs that were needed, meds refused, or side effects to meds)- prn hydroxyzine  @ 2103 (Any disturbances and when (visitation, over night)-none (Concerns raised by the patient)- none (SI/HI/AVH)- denies all

## 2024-02-19 NOTE — Plan of Care (Signed)
   Problem: Education: Goal: Emotional status will improve Outcome: Not Progressing Goal: Mental status will improve Outcome: Not Progressing

## 2024-02-19 NOTE — Group Note (Signed)
 Date:  02/19/2024 Time:  10:11 AM  Group Topic/Focus:  Goals Group:   The focus of this group is to help patients establish daily goals to achieve during treatment and discuss how the patient can incorporate goal setting into their daily lives to aide in recovery.    Participation Level:  Did Not Attend  Wesley Beck 02/19/2024, 10:11 AM

## 2024-02-19 NOTE — Progress Notes (Incomplete)
(  Sleep Hours) -6.25  (Any PRNs that were needed, meds refused, or side effects to meds)- none  (Any disturbances and when (visitation, over night)-none  (Concerns raised by the patient)- none  (SI/HI/AVH)- denies

## 2024-02-19 NOTE — Group Note (Signed)
 Date:  02/19/2024 Time:  5:06 PM  Group Topic/Focus:  Dimensions of Wellness:   The focus of this group is to introduce the topic of wellness and discuss the role each dimension of wellness plays in total health.    Participation Level:  Active  Participation Quality:  Appropriate  Affect:  Appropriate  Cognitive:  Appropriate  Insight: Good  Engagement in Group:  Engaged  Modes of Intervention:  Discussion   Kiam Bransfield 02/19/2024, 5:06 PM

## 2024-02-19 NOTE — BH IP Treatment Plan (Signed)
 Interdisciplinary Treatment and Diagnostic Plan Update  02/19/2024 Time of Session: 10:50 am Wesley Beck MRN: [id number]  Principal Diagnosis: Schizophrenia, disorganized (HCC)  Secondary Diagnoses: Principal Problem:   Schizophrenia, disorganized (HCC)   Current Medications:  Current Facility-Administered Medications  Medication Dose Route Frequency Provider Last Rate Last Admin   acetaminophen  (TYLENOL ) tablet 650 mg  650 mg Oral Q6H PRN White, [name], NP   650 mg at 02/16/24 1303   albuterol  (VENTOLIN  HFA) 108 (90 Base) MCG/ACT inhaler 1-2 puff  1-2 puff Inhalation Q4H PRN White, [name], NP   2 puff at 02/17/24 1716   alum & mag hydroxide-simeth (MAALOX/MYLANTA) 200-200-20 MG/5ML suspension 30 mL  30 mL Oral Q4H PRN White, [name], NP       ARIPiprazole  (ABILIFY ) tablet 15 mg  15 mg Oral Daily Prunty, Donald B, DO   15 mg at 02/19/24 9183   atorvastatin  (LIPITOR) tablet 20 mg  20 mg Oral Daily Prunty, Donald B, DO   20 mg at 02/19/24 9183   budesonide -glycopyrrolate -formoterol  (BREZTRI ) 160-9-4.8 MCG/ACT inhaler 2 puff  2 puff Inhalation BID Teresa Jes L, NP   2 puff at 02/19/24 0816   carvedilol  (COREG ) tablet 3.125 mg  3.125 mg Oral BID WC White, [name], NP   3.125 mg at 02/19/24 9183   cloNIDine  (CATAPRES ) tablet 0.1 mg  0.1 mg Oral TID PRN Towana Leita SAILOR, MD   0.1 mg at 02/17/24 2106   haloperidol  (HALDOL ) tablet 5 mg  5 mg Oral TID PRN Teresa Jes L, NP       And   diphenhydrAMINE  (BENADRYL ) capsule 50 mg  50 mg Oral TID PRN White, [name], NP       haloperidol  lactate (HALDOL ) injection 5 mg  5 mg Intramuscular TID PRN White, [name], NP       And   diphenhydrAMINE  (BENADRYL ) injection 50 mg  50 mg Intramuscular TID PRN White, [name], NP       And   LORazepam  (ATIVAN ) injection 2 mg  2 mg Intramuscular TID PRN White, [name], NP       haloperidol  lactate (HALDOL ) injection 10 mg  10 mg Intramuscular TID PRN White, [name], NP       And    diphenhydrAMINE  (BENADRYL ) injection 50 mg  50 mg Intramuscular TID PRN White, [name], NP       And   LORazepam  (ATIVAN ) injection 2 mg  2 mg Intramuscular TID PRN White, [name], NP       hydrocortisone  cream 1 %   Topical TID Lera Golas B, DO   Given at 02/18/24 1703   hydrOXYzine  (ATARAX ) tablet 25 mg  25 mg Oral TID PRN White, [name], NP   25 mg at 02/18/24 2123   loratadine  (CLARITIN ) tablet 10 mg  10 mg Oral Daily Prunty, Donald B, DO       magnesium  hydroxide (MILK OF MAGNESIA) suspension 30 mL  30 mL Oral Daily PRN White, [name], NP       mineral oil-hydrophilic petrolatum (AQUAPHOR) ointment   Topical Daily PRN Elodie Palma, MD       naphazoline-glycerin  (CLEAR EYES REDNESS) ophth solution 1-2 drop  1-2 drop Both Eyes QID PRN Elodie Palma, MD       nicotine  (NICODERM CQ  - dosed in mg/24 hours) patch 21 mg  21 mg Transdermal Daily White, [name], NP   21 mg at 02/19/24 0815   OLANZapine  (ZYPREXA )  tablet 10 mg  10 mg Oral QHS Prunty, Donald B, DO   10 mg at 02/18/24 2124   ondansetron  (ZOFRAN ) tablet 4 mg  4 mg Oral Q8H PRN White, [name], NP       PTA Medications: Medications Prior to Admission  Medication Sig Dispense Refill Last Dose/Taking   B Complex-C (B-COMPLEX WITH VITAMIN C) tablet Take 1 tablet by mouth daily.   Taking   Cholecalciferol 25 MCG (1000 UT) tablet Take 1,000 Units by mouth daily.   Taking   magnesium  oxide (MAG-OX) 400 (240 Mg) MG tablet Take 400 mg by mouth daily.   Taking   Multiple Vitamins-Minerals (MULTIVITAMIN WITH MINERALS) tablet Take 1 tablet by mouth daily.   Taking   albuterol  (VENTOLIN  HFA) 108 (90 Base) MCG/ACT inhaler Inhale 1-2 puffs into the lungs every 4 (four) hours as needed for wheezing or shortness of breath. 6.7 g 1    atorvastatin  (LIPITOR) 20 MG tablet Take 1 tablet (20 mg total) by mouth See admin instructions. Take it on Sunday, Tuesday, Wednesday, Thursday, and Saturday as needed for high cholesterol. 30 tablet 0     carvedilol  (COREG ) 3.125 MG tablet Take 1 tablet (3.125 mg total) by mouth 2 (two) times daily with a meal. 60 tablet 2    Coenzyme Q10 (COQ10 PO) Take 1 tablet by mouth 3 (three) times a week. Monday, Wednesday, and Friday      loratadine  (CLARITIN ) 10 MG tablet Take 1 tablet (10 mg total) by mouth daily as needed for allergies. 30 tablet 0    meloxicam (MOBIC) 7.5 MG tablet Take 7.5 mg by mouth 2 (two) times daily.      OLANZapine  (ZYPREXA ) 10 MG tablet Take 1 tablet (10 mg total) by mouth at bedtime. 90 tablet 3    rosuvastatin  (CRESTOR ) 10 MG tablet Take 10 mg by mouth 2 (two) times a week. Monday and Friday      thiamine  (VITAMIN B1) 100 MG tablet Take 100 mg by mouth daily.      TRELEGY ELLIPTA 100-62.5-25 MCG/ACT AEPB Take 1 puff by mouth daily.      zinc gluconate 50 MG tablet Take 50 mg by mouth daily.       Patient Stressors: Other: hopelessness    Patient Strengths: Other: to read and understand the word to tell the truth from a lie  Treatment Modalities: Medication Management, Group therapy, Case management,  1 to 1 session with clinician, Psychoeducation, Recreational therapy.   Physician Treatment Plan for Primary Diagnosis: Schizophrenia, disorganized (HCC) Long Term Goal(s):     Short Term Goals:    Medication Management: Evaluate patient's response, side effects, and tolerance of medication regimen.  Therapeutic Interventions: 1 to 1 sessions, Unit Group sessions and Medication administration.  Evaluation of Outcomes: Not Progressing  Physician Treatment Plan for Secondary Diagnosis: Principal Problem:   Schizophrenia, disorganized (HCC)  Long Term Goal(s):     Short Term Goals:       Medication Management: Evaluate patient's response, side effects, and tolerance of medication regimen.  Therapeutic Interventions: 1 to 1 sessions, Unit Group sessions and Medication administration.  Evaluation of Outcomes: Not Progressing   RN Treatment Plan for  Primary Diagnosis: Schizophrenia, disorganized (HCC) Long Term Goal(s): Knowledge of disease and therapeutic regimen to maintain health will improve  Short Term Goals: Ability to remain free from injury will improve, Ability to verbalize frustration and anger appropriately will improve, Ability to demonstrate self-control, Ability to participate in decision making will  improve, Ability to verbalize feelings will improve, Ability to disclose and discuss suicidal ideas, Ability to identify and develop effective coping behaviors will improve, and Compliance with prescribed medications will improve  Medication Management: RN will administer medications as ordered by provider, will assess and evaluate patient's response and provide education to patient for prescribed medication. RN will report any adverse and/or side effects to prescribing provider.  Therapeutic Interventions: 1 on 1 counseling sessions, Psychoeducation, Medication administration, Evaluate responses to treatment, Monitor vital signs and CBGs as ordered, Perform/monitor CIWA, COWS, AIMS and Fall Risk screenings as ordered, Perform wound care treatments as ordered.  Evaluation of Outcomes: Not Progressing   LCSW Treatment Plan for Primary Diagnosis: Schizophrenia, disorganized (HCC) Long Term Goal(s): Safe transition to appropriate next level of care at discharge, Engage patient in therapeutic group addressing interpersonal concerns.  Short Term Goals: Engage patient in aftercare planning with referrals and resources, Increase social support, Increase ability to appropriately verbalize feelings, Increase emotional regulation, Facilitate acceptance of mental health diagnosis and concerns, Facilitate patient progression through stages of change regarding substance use diagnoses and concerns, Identify triggers associated with mental health/substance abuse issues, and Increase skills for wellness and recovery  Therapeutic Interventions: Assess  for all discharge needs, 1 to 1 time with Social worker, Explore available resources and support systems, Assess for adequacy in community support network, Educate family and significant other(s) on suicide prevention, Complete Psychosocial Assessment, Interpersonal group therapy.  Evaluation of Outcomes: Not Progressing   Progress in Treatment: Attending groups: Yes. Participating in groups: Yes. Taking medication as prescribed: Yes. Toleration medication: Yes. Family/Significant other contact made: No, will contact:  patient declined consents. Patient understands diagnosis: Yes. Discussing patient identified problems/goals with staff: Yes. Medical problems stabilized or resolved: Yes. Denies suicidal/homicidal ideation: Yes. Issues/concerns per patient self-inventory: No. None reported.  New problem(s) identified: No, Describe:  None identified.  New Short Term/Long Term Goal(s):  medication stabilization, elimination of SI thoughts, development of comprehensive mental wellness plan.    Patient Goals: Gain sanity and keep it  Discharge Plan or Barriers: Patient recently admitted. CSW will continue to follow and assess for appropriate referrals and possible discharge planning.    Reason for Continuation of Hospitalization: Delusions  Medication stabilization  Estimated Length of Stay: 3-5 days.  Last 3 [place] Suicide Severity Risk Score: Flowsheet Row [facility] (Current[facility] ED from 12/30/2023 in [facility] Emergency Department at [facility]  C-SSRS RISK CATEGORY Moderate Risk Moderate Risk No Risk    Last PHQ 2/9 Scores:    09/06/2023   10:38 AM  Depression screen PHQ 2/9  Decreased Interest 0  Down, Depressed, Hopeless 0  PHQ - 2 Score 0    Scribe for Treatment Team: Louetta Wynona SILK 02/19/2024 8:43 AM

## 2024-02-19 NOTE — Group Note (Signed)
 Recreation Therapy Group Note   Group Topic:Animal Assisted Therapy   Group Date: 02/19/2024 Start Time: 9050 End Time: 1030 Facilitators: Chinedu Agustin-McCall, LRT,CTRS Location: 300 Hall Dayroom   Animal-Assisted Activity (AAA) Program Checklist/Progress Notes Patient Eligibility Criteria Checklist & Daily Group note for Rec Tx Intervention  AAA/T Program Assumption of Risk Form signed by Patient/ or Parent Legal Guardian Yes  Patient understands his/her participation is voluntary Yes  Behavioral Response:    Education: Charity Fundraiser, Appropriate Animal Interaction   Education Outcome: Acknowledges education.    Affect/Mood: N/A   Participation Level: Did not attend    Clinical Observations/Individualized Feedback:      Plan: Continue to engage patient in RT group sessions 2-3x/week.   Saadiya Wilfong-McCall, LRT,CTRS 02/19/2024 12:40 PM

## 2024-02-19 NOTE — Progress Notes (Signed)
 [facility] Inpatient Psychiatry Progress Note  Date: 02/19/2024 Patient: Wesley Beck MRN: [id number]  Assessment and Plan: Wesley Beck is a 54 y.o. male a past psychiatric history of Schizoaffective bipolar disorder, polysubstance use. Patient initially arrived to [facility] on 1/8 for psychotic behavior, and admitted to [facility] under IVC on 1/9 for impaired functioning. PMHx is significant for Asthma, Back pain, HTN, HLD.   Patient continues to be perseverative, paranoid towards family, delusional, and mildly to disorganized. He continues to have unrealistic expectations for what inpatient hospitalization can assist him with. Patient refused zyprexa  and states that he wants to be on Abilify  monotherapy. Patient has moderate eczema on face and neck causing irritation. Hoping to get pt stable on an Abilify  LAI monotherapy. Using Zyprexa  and Abilify  initially with intent to discontinue Zyprexa  prior to DC once Abilify  dose is high enough; his last Abilify  stable dose was 30mg  daily. Speech has been improving with medications so far, but still mildly disorganized.  # Schizophrenia             - Increasing Abilify  to 20mg  PO daily                         - Reports being at 30mg  daily in the past             - Continue Olanzapine  10mg  nightly                         - Discontinue by discharge   Psychiatry General PRNs             -- Trazodone  50 mg once nightly as needed for insomnia             -- Hydroxyzine  25 mg 3 times daily as needed for anxiety             -- Agitation protocol: Haldol , Benadryl , lorazepam      Medical Issues Being Addressed:  #Eczema  - Continue hydrocortisone  1% cream PRN for skin irritation  - Continue Aquaphor PRN for skin irritation  # Asthma # COPD             - Continue home Breztri              - Continue home Albuterol    # HLD             - Continue home Atorvastatin  20mg  daily              # HTN             - Continue home  Coreg  3.125mg              - Clonodine PRN   # Allergies             - Continue home Claratin  - Continue Clear Eyes Redness eye drops for eye irritation   Medical General PRNs             - Tylenol  tablets 650 mg every 6 hours as needed for pain - Maalox/Mylanta suspension 30 mL every 4 hours as needed for indigestion - Milk of Magnesia 30 mL daily as needed for constipation    Risk Assessment - mild. Is denying SI, but is still disorganized.  Discharge Planning Barriers to discharge: Active psychosis Estimated length of stay: 7-10 days Predicted Discharge location: Shelter   Interval History and update:   02/19/2024 No significant  events overnight. Vital Signs are Stable. MAR was reviewed and patient was compliant with medications yesterday. They received  PRN Hydroxyzine  yesterday. They spent 67% of their time in the bedroom over the last 24 hours. Case was discussed in the multidisciplinary team.   On interview patient reports that his racing thoughts have improved, which has in turn helped his anxiety level. Still perseverates on family, but recognizes that the medication is helping him to focus more on the future and less on the past. Denies any physical concerns from the medications. Denies SI, HI, AH, VH.  Discussed plan with patient to which they were agreeable. Pt questions were answered.    Physical Exam MSK/Neuro - Normal gait and station   Mental Status Exam Apperance: Appropriate for environment, Sitting upright, and Standing Behavior: Calm Speech: Normal Rate, Articulate, Normal Volume, and TALKATIVE Attitude: Cooperative and Friendly Mood: better Affect: BLUNTED and Mood Congruent Perception: Not responding to internal stimuli Thought Content: within normal limits Thought Form: Goal Directed, DISORGANIZED (mild), Logical, and PERSEVERATION Cognition: Alert & Oriented to person, place, and time, Recent and Remote memory grossly intact by recounting  personal history, and Immediate memory grossly intact by interview Judgment: POOR Insight: POOR   Lab Results:  Admission on 02/15/2024  Component Date Value Ref Range Status   TSH 02/16/2024 4.120  0.350 - 4.500 uIU/mL Final   Cholesterol 02/16/2024 131  0 - 200 mg/dL Final   Triglycerides 98/89/7973 104  <150 mg/dL Final   HDL 98/89/7973 37 (L)  >40 mg/dL Final   Total CHOL/HDL Ratio 02/16/2024 3.5  RATIO Final   VLDL 02/16/2024 21  0 - 40 mg/dL Final   LDL Cholesterol 02/16/2024 73  0 - 99 mg/dL Final   Hgb J8r MFr Bld 02/16/2024 5.3  4.8 - 5.6 % Final   Mean Plasma Glucose 02/16/2024 105.41  mg/dL Final    Qtc: 428ms   Vitals: Blood pressure 125/89, pulse 76, temperature (!) 97.3 F (36.3 C), temperature source Oral, resp. rate 20, height 5' 9 (1.753 m), weight 91.8 kg, SpO2 97%.   DSABRA Penne Mori, DO, PGY1 [facility] Psychiat[facility] Res[facility]

## 2024-02-19 NOTE — BHH Group Notes (Signed)
 The focus of this group is to help patients review their daily goal of treatment and discuss progress on daily workbooks. Pt was not present during tonight's wrap up group discussion.

## 2024-02-19 NOTE — Group Note (Signed)
 LCSW Group Therapy Note   Group Date: 02/19/2024 Start Time: 1100 End Time: 1200   Participation:  patient was present and actively participated in the discussion.  Type of Therapy:  Group Therapy   Topic:  Understanding Your Path to Change.  Objective:  The goal is to help individuals understand the stages of change, identify where they currently are in the process, and provide actionable next steps to continue moving forward in their journey of change.  Goals: - Learn about the six stages of change:  Precontemplation, Contemplation, Preparation, Action, Maintenance, and Relapse - Reflect on Current Change Efforts:  Recognize which stage participants are in regarding a personal change. - Plan Next Steps for Moving Forward:  Create an action plan based on their current stage of change.  Class Summary:  In this session, we explored the Stages of Change as a framework to understand the process of change.  We discussed how each stage helps individuals recognize where they are in their personal journey and used the Stages of Change Worksheet for self-reflection. Participants answered questions to better understand their current stage, challenges, and progress. We also emphasized the importance of moving forward, even if setbacks (Relapse) occur, and created actionable steps to help participants continue progressing. By the end of the session, participants gained a clearer understanding of their path to change and left with a clear plan for next steps.  Therapeutic Modalities: Elements of CBT (cognitive restructuring, problem solving)  Element of DBT (mindfulness, distress tolerance)   Wesley Beck O Wesley Parish, LCSW 02/19/2024  1:07 PM

## 2024-02-19 NOTE — BHH Group Notes (Signed)
 Patient attended the [facility] group.

## 2024-02-19 NOTE — Plan of Care (Signed)
   Problem: Education: Goal: Emotional status will improve Outcome: Progressing Goal: Mental status will improve Outcome: Progressing   Problem: Activity: Goal: Interest or engagement in activities will improve Outcome: Progressing

## 2024-02-19 NOTE — BHH Group Notes (Signed)
 Patient attended the Social Work group.

## 2024-02-19 NOTE — BHH Group Notes (Signed)
 Adult Psychoeducational Group Note  Date:  02/19/2024 Time:  8:42 PM  Group Topic/Focus:  Wrap-Up Group:   The focus of this group is to help patients review their daily goal of treatment and discuss progress on daily workbooks.  Participation Level:  Active  Participation Quality:  Attentive  Affect:  Appropriate  Cognitive:  Alert  Insight: Appropriate  Engagement in Group:  Engaged  Modes of Intervention:  Discussion  Additional Comments:  Patient attended and participated in the Wrap-up group.  Wesley Beck 02/19/2024, 8:42 PM

## 2024-02-20 MED ORDER — NICOTINE POLACRILEX 2 MG MT GUM
2.0000 mg | CHEWING_GUM | OROMUCOSAL | Status: DC | PRN
  Administered 2024-02-20 – 2024-02-24 (×3): 2 mg via ORAL

## 2024-02-20 NOTE — Group Note (Addendum)
 Date:  02/20/2024 Time:  9:32 AM  Group Topic/Focus:  Goals Group:   The focus of this group is to help patients establish daily goals to achieve during treatment and discuss how the patient can incorporate goal setting into their daily lives to aide in recovery. Orientation:   The focus of this group is to educate the patient on the purpose and policies of crisis stabilization and provide a format to answer questions about their admission.  The group details unit policies and expectations of patients while admitted.    Participation Level:   Attended   Wesley Beck 02/20/2024, 9:32 AM

## 2024-02-20 NOTE — Progress Notes (Signed)
 [facility] Inpatient Psychiatry Progress Note  Date: 02/20/2024 Patient: Wesley Beck MRN: [id number]  Assessment and Plan: RAVEN HARMES is a 54 y.o. male a past psychiatric history of Schizoaffective bipolar disorder, polysubstance use. Patient initially arrived to [facility] on 1/8 for psychotic behavior, and admitted to [facility] under IVC on 1/9 for impaired functioning. PMHx is significant for Asthma, Back pain, HTN, HLD.   Patient continues to be perseverative, paranoid towards family, delusional, and mildly to disorganized. He continues to have unrealistic expectations for what inpatient hospitalization can assist him with. Patient has moderate eczema on face and neck causing irritation. Hoping to get pt stable on an Abilify  LAI monotherapy. His last Abilify  stable dose was 30mg  daily. Speech has been improving with medications so far, but still mildly disorganized and perseverative. Has shown no dangerous thoughts or actions toward others, and his perseveration has a strong sense of wanting to leave people alone. States clearly that while he has no intentions of harming others, he will absolutely defend himself if he feels threatened.   If Abilify  is helpful at 30mg  daily, could have LAI on Friday and DC possibly this weekend.  # Schizophrenia             - Increasing Abilify  to 30mg  PO daily                         - Reports being at 30mg  daily in the past             - Stop Olanzapine  10mg  nightly    Psychiatry General PRNs             -- Trazodone  50 mg once nightly as needed for insomnia             -- Hydroxyzine  25 mg 3 times daily as needed for anxiety             -- Agitation protocol: Haldol , Benadryl , lorazepam     Medical Issues Being Addressed:  #Eczema  - Continue hydrocortisone  1% cream PRN for skin irritation  - Continue Aquaphor PRN for skin irritation  # Asthma # COPD             - Continue home Breztri              - Continue home  Albuterol    # HLD             - Continue home Atorvastatin  20mg  daily              # HTN             - Continue home Coreg  3.125mg              - Clonodine PRN   # Allergies             - Continue home Claratin  - Continue Clear Eyes Redness eye drops for eye irritation   Medical General PRNs             - Tylenol  tablets 650 mg every 6 hours as needed for pain - Maalox/Mylanta suspension 30 mL every 4 hours as needed for indigestion - Milk of Magnesia 30 mL daily as needed for constipation    Risk Assessment - mild. Is denying SI, but is still disorganized.  Discharge Planning Barriers to discharge: Active psychosis Estimated length of stay: 7-10 days Predicted Discharge location: Shelter   Interval History and update:  02/20/2024 No significant events overnight. Vital Signs are Stable. MAR was reviewed and patient was compliant with medications yesterday. They received  PRN Atarax  yesterday. They spent 69% of their time in the bedroom over the last 24 hours. Case was discussed in the multidisciplinary team.   On interview patient reports feeling better than yesterday and says that he had a vision which was good because all visions are good. He states that it was of a bus, and he's not sure if he'll do anything because of it. No voices at the moment. Reports that he can see odd shadows that shouldn't be there if he looks for them, but they don't bother him. Racing thoughts still present from time to time, and still significant perseveration on the same family subjects from the past few days.  Discussed plan with patient to increase Abilify  and stop Zyprexa  to which they were agreeable. Pt questions were answered.   Physical Exam MSK/Neuro - Normal gait and station   Mental Status Exam  Apperance: Appropriate for environment and Standing Behavior: Calm Speech: Fast (not pressured), Articulate, Normal Volume, and TALKATIVE Attitude: Cooperative and Friendly Mood:  better than yesterday Affect: Euthymic, ANXIOUS, Normal Range, LABILE, and Mood Congruent Perception: Not responding to internal stimuli Thought Content: within normal limits Thought Form: Goal Directed, DISORGANIZED (mild), Logical, and PERSEVERATION Cognition: Alert & Oriented to person, place, and time, Recent and Remote memory grossly intact by recounting personal history, and Immediate memory grossly intact by interview Judgment: Fair Insight: POOR    Lab Results:  Admission on 02/15/2024  Component Date Value Ref Range Status   TSH 02/16/2024 4.120  0.350 - 4.500 uIU/mL Final   Cholesterol 02/16/2024 131  0 - 200 mg/dL Final   Triglycerides 98/89/7973 104  <150 mg/dL Final   HDL 98/89/7973 37 (L)  >40 mg/dL Final   Total CHOL/HDL Ratio 02/16/2024 3.5  RATIO Final   VLDL 02/16/2024 21  0 - 40 mg/dL Final   LDL Cholesterol 02/16/2024 73  0 - 99 mg/dL Final   Hgb J8r MFr Bld 02/16/2024 5.3  4.8 - 5.6 % Final   Mean Plasma Glucose 02/16/2024 105.41  mg/dL Final    Qtc: 428ms J8r: 5.3 Lipid Panel: Cholesterol 131, LDL 73  Vitals: Blood pressure (!) 140/89, pulse 73, temperature (!) 97.3 F (36.3 C), temperature source Oral, resp. rate 20, height 5' 9 (1.753 m), weight 91.8 kg, SpO2 96%.   DSABRA Penne Mori, DO, PGY1 [facility] Res[facility]

## 2024-02-20 NOTE — Progress Notes (Deleted)
" °   02/20/24 0800  Psych Admission Type (Psych Patients Only)  Admission Status Involuntary  Psychosocial Assessment  Patient Complaints Depression;Hopelessness  Eye Contact Fair  Facial Expression Sad  Affect Preoccupied  Speech Logical/coherent  Interaction Assertive  Motor Activity Other (Comment) (WNL)  Appearance/Hygiene Disheveled  Behavior Characteristics Cooperative  Mood Sad;Preoccupied  Thought Process  Coherency WDL  Content WDL  Delusions None reported or observed  Perception WDL  Hallucination None reported or observed  Judgment Poor  Confusion None  Danger to Self  Current suicidal ideation? Denies  Agreement Not to Harm Self Yes  Description of Agreement verbal  Danger to Others  Danger to Others None reported or observed    "

## 2024-02-20 NOTE — Progress Notes (Signed)
" °   02/20/24 1700  Psych Admission Type (Psych Patients Only)  Admission Status Involuntary  Psychosocial Assessment  Patient Complaints Depression;Hopelessness  Eye Contact Fair  Facial Expression Sad  Affect Preoccupied  Speech Tangential  Interaction Assertive  Motor Activity Slow  Appearance/Hygiene Disheveled  Behavior Characteristics Cooperative  Mood Sad;Preoccupied  Thought Process  Coherency Tangential;Disorganized  Content Delusions  Delusions Persecutory  Perception WDL  Hallucination None reported or observed  Judgment Poor  Confusion None  Danger to Self  Current suicidal ideation? Denies  Agreement Not to Harm Self Yes  Description of Agreement verbal  Danger to Others  Danger to Others None reported or observed    "

## 2024-02-20 NOTE — Group Note (Deleted)
 Date:  02/20/2024 Time:  9:24 AM  Group Topic/Focus:  Goals Group:   The focus of this group is to help patients establish daily goals to achieve during treatment and discuss how the patient can incorporate goal setting into their daily lives to aide in recovery. Orientation:   The focus of this group is to educate the patient on the purpose and policies of crisis stabilization and provide a format to answer questions about their admission.  The group details unit policies and expectations of patients while admitted.     Participation Level:  {BHH PARTICIPATION OZCZO:77735}  Participation Quality:  {BHH PARTICIPATION QUALITY:22265}  Affect:  {BHH AFFECT:22266}  Cognitive:  {BHH COGNITIVE:22267}  Insight: {BHH Insight2:20797}  Engagement in Group:  {BHH ENGAGEMENT IN HMNLE:77731}  Modes of Intervention:  {BHH MODES OF INTERVENTION:22269}  Additional Comments:  ***  Mazal Ebey 02/20/2024, 9:24 AM

## 2024-02-20 NOTE — Group Note (Signed)
 Date:  02/20/2024 Time:  1:28 PM  Group Topic/Focus:  Spirituality:   The focus of this group is to discuss how one's spirituality can aide in recovery.    Participation Level:  Attended  Yer Olivencia 02/20/2024, 1:28 PM

## 2024-02-20 NOTE — Group Note (Signed)
 Date:  02/20/2024 Time:  5:05 PM  Group Topic/Focus:  Managing Feelings:   The focus of this group is to identify what feelings patients have difficulty handling and develop a plan to handle them in a healthier way upon discharge. Overcoming Stress:   The focus of this group is to define stress and help patients assess their triggers.    Participation Level:  Active  Participation Quality:  Appropriate  Affect:  Anxious and Appropriate  Cognitive:  Appropriate  Insight: Appropriate  Engagement in Group:  Engaged  Modes of Intervention:  Activity  Additional Comments:    Wesley Beck Wesley Beck 02/20/2024, 5:05 PM

## 2024-02-20 NOTE — BHH Group Notes (Signed)
 Adult Psychoeducational Group Note  Date:  02/20/2024 Time:  9:27 PM  Group Topic/Focus:  Wrap-Up Group:   The focus of this group is to help patients review their daily goal of treatment and discuss progress on daily workbooks.  Participation Level:  Active  Participation Quality:  Appropriate  Affect:  Appropriate  Cognitive:  Appropriate  Insight: Appropriate  Engagement in Group:  Engaged  Modes of Intervention:  Discussion  Additional Comments:  Hal day was 8. Goal for today housing opportunities. Coping skills music and forsight. Favorite part of the day music   Lang Donia Law 02/20/2024, 9:27 PM

## 2024-02-20 NOTE — Plan of Care (Signed)
  Problem: Education: Goal: Emotional status will improve Outcome: Progressing Goal: Verbalization of understanding the information provided will improve Outcome: Progressing   Problem: Activity: Goal: Interest or engagement in activities will improve Outcome: Progressing   

## 2024-02-20 NOTE — Group Note (Signed)
 Recreation Therapy Group Note   Group Topic:Communication  Group Date: 02/20/2024 Start Time: 0940 End Time: 1005 Facilitators: Keval Nam-McCall, LRT,CTRS Location: [address] Dayroom   Group Topic: Communication, Team Building, Problem Solving  Goal Area(s) Addresses:  Patient will effectively work with peer towards shared goal.  Patient will identify skills used to make activity successful.  Patient will identify how skills used during activity can be applied to reach post d/c goals.   Behavioral Response: Engaged  Intervention: STEM Activity- Glass Blower/designer  Activity: Tallest [employer]. In teams of 5-6, patients were given 11 craft pipe cleaners. Using the materials provided, patients were instructed to compete again the opposing team(s) to build the tallest free-standing structure from floor level. The activity was timed; difficulty increased by clinical research associate as production designer, theatre/television/film continued.  Systematically resources were removed with additional directions for example, placing one arm behind their back, working in silence, and shape stipulations. LRT facilitated post-activity discussion reviewing team processes and necessary communication skills involved in completion. Patients were encouraged to reflect how the skills utilized, or not utilized, in this activity can be incorporated to positively impact support systems post discharge.  Education: Pharmacist, Community, Scientist, Physiological, Discharge Planning   Education Outcome: Acknowledges education/In group clarification offered/Needs additional education.    Affect/Mood: Appropriate   Participation Level: Engaged   Participation Quality: Independent   Behavior: Appropriate   Speech/Thought Process: Focused   Insight: Good   Judgement: Good   Modes of Intervention: STEM Activity   Patient Response to Interventions:  Engaged   Education Outcome:  In group clarification offered    Clinical  Observations/Individualized Feedback: Pt attended and participated in group session.     Plan: Continue to engage patient in RT group sessions 2-3x/week.   Keaton Beichner-McCall, LRT,CTRS 02/20/2024 1:16 PM

## 2024-02-20 NOTE — Progress Notes (Signed)
 Spirituality group facilitated by Elia Rockie Sofia, BCC.  Group Description: Group focused on topic of community. Patients participated in facilitated discussion around topic, connecting with one another around experiences and definitions for community. Group members engaged with visual explorer photos, reflecting on what community looks like for them today. Group engaged in discussion around how their definitions of community are present today in hospital.  Modalities: Psycho-social ed, Adlerian, Narrative, MI  Patient Progress: Wesley Beck attended group and actively engaged and participated in group conversation and activities.

## 2024-02-20 NOTE — Progress Notes (Signed)
(  Sleep Hours) -8.25 (Any PRNs that were needed, meds refused, or side effects to meds)- no (Any disturbances and when (visitation, over night)-no (Concerns raised by the patient)- no (SI/HI/AVH)- denies

## 2024-02-20 NOTE — Group Note (Signed)
 Date:  02/20/2024 Time:  10:06 AM  Group Topic/Focus:  Recreational Therapy    Participation Level:  Atended  Vena Mais 02/20/2024, 10:06 AM

## 2024-02-20 NOTE — Plan of Care (Signed)
   Problem: Education: Goal: Emotional status will improve Outcome: Progressing Goal: Mental status will improve Outcome: Progressing   Problem: Activity: Goal: Sleeping patterns will improve Outcome: Progressing

## 2024-02-21 ENCOUNTER — Telehealth (HOSPITAL_COMMUNITY): Payer: Self-pay

## 2024-02-21 ENCOUNTER — Other Ambulatory Visit (HOSPITAL_COMMUNITY): Payer: Self-pay

## 2024-02-21 MED ORDER — ARIPIPRAZOLE ER 400 MG IM SRER: 400.0000 mg | INTRAMUSCULAR | Status: DC

## 2024-02-21 MED ORDER — ARIPIPRAZOLE 10 MG PO TABS
10.0000 mg | ORAL_TABLET | Freq: Once | ORAL | Status: AC
  Administered 2024-02-21: 10 mg via ORAL
  Filled 2024-02-21: qty 1

## 2024-02-21 NOTE — Care Management Important Message (Signed)
 Medicare IM printed and given to social work to give to the patient. ?

## 2024-02-21 NOTE — Progress Notes (Signed)
 Note Type: Client Interaction  This clinical research associate encouraged patient to contact [facility] from [facility] transitional housing. Patient called Kareem who requested for the patient to call again around dinner time. This clinical research associate also informed the patient that the current expected discharge date is 02/23/24.  SIGNED: Raymar Joiner Nunez-Uva, LCSW-A

## 2024-02-21 NOTE — BHH Counselor (Signed)
 The LCSWA spoke with the patient regarding his living situation after discharge.   The patient stated that he had options and that he waiting to hear from an adoptive brother if he could stay there.  The patient stated that he was been communicating with someone at transitional housing that is also a possibility.   The patient reported that his father may provide transport upon discharge.  Roselyn Lento, MSW, LCSWA

## 2024-02-21 NOTE — Group Note (Signed)
 Date:  02/21/2024 Time:  4:34 PM  Group Topic/Focus:Occupational therapy  Grounding techniques in occupational therapy help adult mental health patients manage stress, anxiety, and emotional distress by bringing attention to the present moment. These techniques use sensory input, physical movement, breathing, or cognitive focus to support emotional regulation and improve participation in daily activities. By integrating grounding strategies into meaningful occupations and routines, occupational therapists promote coping skills, independence, and overall mental well-being.     Participation Level:  Active  Wesley Beck 02/21/2024, 4:34 PM

## 2024-02-21 NOTE — Progress Notes (Signed)
 Wesley Beck   Type of Note: Discharge planning  Spoke with patient this morning regarding [name] transitional housing that pt was interested in going to at discharge. Pt reports he plans to call [name] [phone] for more information. Pt gave verbal and written consent for CSW to reach out to [facility] as well to see if they had any space.   This writer called [name] at the number above who reports having male bed openings. Reports a single room is $850/mo with all utilities included and a shared room is $700/mo. [name] states he would have to come and meet pt prior to offering him a spot in the home. Would like pt to call him today as well prior to setting up a interview.   Signed:  Oluwatobi Ruppe, LCSW-A 02/21/2024  11:29 AM

## 2024-02-21 NOTE — Group Note (Signed)
 LCSW Group Therapy Note   Group Date: 02/21/2024 Start Time: 1100 End Time: 1200   Participation:  patient was present, and actively participated in the discussion.  Type of Therapy:  Group Therapy  Topic:  Healing Flames: Navigating Anger with Compassion  Objective:  Foster self-awareness and promote compassion toward oneself and others when dealing with anger.  Goals:  Help participants understand the underlying emotions and needs fueling anger. Provide coping strategies for healthier emotional expression and anger management.  Summary: This session explored anger as a volcano--an explosion driven by deeper feelings and unmet needs. Participants learned to identify anger triggers and underlying emotions, then practiced coping strategies like deep breathing, physical activity, and journaling. The group discussed healthy ways to manage anger before it escalates, using both personal reflection and shared experiences.  Therapeutic Modalities: Cognitive Behavioral Therapy (CBT): Challenging thoughts that fuel anger. Mindfulness: Increasing awareness of emotions and sensations.   Lydon Vansickle O Johndavid Geralds, LCSWA 02/21/2024  2:14 PM

## 2024-02-21 NOTE — BHH Group Notes (Signed)
 BHH Group Notes:  (Nursing/MHT/Case Management/Adjunct)  Date:  02/21/2024  Time:  9:08 PM  Type of Therapy:  Wrap up group  Participation Level:  Active  Participation Quality:  Appropriate  Affect:  Appropriate  Cognitive:  Appropriate  Insight:  Appropriate  Engagement in Group:  Engaged  Modes of Intervention:  Education  Summary of Progress/ProblemsGoal to be D/C. Rated day 9/10.  Grayce LITTIE Essex 02/21/2024, 9:08 PM

## 2024-02-21 NOTE — Group Note (Signed)
 Date:  02/21/2024 Time:  10:03 AM  Group Topic/Focus: Nutrition Group  For adult patients, mental health is strongly supported by adequate intake of key nutrient groups that maintain brain function and emotional regulation. Complex carbohydrates provide a steady source of energy and support serotonin production, while proteins supply essential amino acids needed for neurotransmitters such as dopamine and serotonin. Healthy fats, particularly omega-3 fatty acids, are vital for brain cell membrane integrity and cognitive function. Micronutrients including B-complex vitamins, vitamin D, magnesium , zinc, and iron play critical roles in mood regulation, stress response, and neural signaling, with deficiencies often linked to depression and anxiety. Antioxidants from fruits and vegetables help protect the brain from oxidative stress, and dietary fiber along with probiotics supports the gut-brain axis, which influences mood and behavior. Adequate hydration further supports concentration, energy levels, and emotional stability.    Participation Level:  Active  Loreto Loescher M Vernetta Dizdarevic 02/21/2024, 10:03 AM

## 2024-02-21 NOTE — Telephone Encounter (Signed)
 Pharmacy Patient Advocate Encounter  Insurance verification completed.    The patient is insured through [facility]. Patient has Medicare and is not eligible for a copay card, but may be able to apply for patient assistance or Medicare RX Payment Plan (Patient Must reach out to their plan, if eligible for payment plan), if available.    Ran test claim for Abilify  Maintena 400mg  syringe and the current 30 day co-pay is $4.90.   This test claim was processed through [facility]- copay amounts may vary at other pharmacies due to pharmacy/plan contracts, or as the patient moves through the different stages of their insurance plan.

## 2024-02-21 NOTE — Group Note (Signed)
 Date:  02/21/2024 Time:  2:27 PM  Group Topic/Focus: Social Work  Anger is a normal and natural emotion, but when it is not understood or managed well, it can negatively affect our mental health, relationships, and daily functioning. In this group, we explore how anger often serves as a signal that something feels unfair, threatening, or overwhelming. By learning to recognize early warning signs--such as physical tension, racing thoughts, or irritability--patients can begin to pause before reacting. Anger management skills like deep breathing, grounding techniques, healthy communication, and problem-solving help individuals express their feelings in safer and more constructive ways. Developing these skills empowers patients to gain better emotional control, reduce conflict, and improve overall well-being.    Participation Level:  Active   Dolores HERO Tessy Pawelski 02/21/2024, 2:27 PM

## 2024-02-21 NOTE — Care Management Important Message (Signed)
 Patient informed of right to appeal discharge, provided phone number to [facility]. Patient expressed no interest in appealing discharge at this time. CSW will continue to monitor situation.

## 2024-02-21 NOTE — Plan of Care (Signed)
   Problem: Education: Goal: Knowledge of [facility] Education information/materials will improve Outcome: Progressing Goal: Emotional status will improve Outcome: Progressing Goal: Mental status will improve Outcome: Progressing Goal: Verbalization of understanding the information provided will improve Outcome: Progressing

## 2024-02-21 NOTE — Group Note (Signed)
 Date:  02/21/2024 Time:  9:52 AM  Group Topic/Focus: Goals and orientation Goals Group:   The focus of this group is to help patients establish daily goals to achieve during treatment and discuss how the patient can incorporate goal setting into their daily lives to aide in recovery. Orientation:   The focus of this group is to educate the patient on the purpose and policies of crisis stabilization and provide a format to answer questions about their admission.  The group details unit policies and expectations of patients while admitted.    Participation Level:  Active  Participation Quality:  Appropriate  Affect:  Appropriate  Cognitive:  Alert  Insight: Appropriate  Engagement in Group:  Engaged  Modes of Intervention:  Orientation  Additional Comments:    Dolores CHRISTELLA Fredericks 02/21/2024, 9:52 AM

## 2024-02-21 NOTE — Plan of Care (Signed)

## 2024-02-21 NOTE — Progress Notes (Signed)
 [facility] Inpatient Psychiatry Progress Note  Date: 02/21/24 Patient: Wesley Beck MRN: [id number]  Assessment and Plan: Wesley Beck is a 54 y.o. male a past psychiatric history of Schizoaffective bipolar disorder, polysubstance use. Patient initially arrived to [facility] on 1/8 for psychotic behavior, and admitted to [facility] under IVC on 1/9 for impaired functioning. PMHx is significant for Asthma, Back pain, HTN, HLD.   Patient continues to be perseverative, paranoid towards family, delusional, and mildly to disorganized. He continues to have unrealistic expectations for what inpatient hospitalization can assist him with. Patient has moderate eczema on face and neck causing irritation. Hoping to get pt stable on an Abilify  LAI monotherapy. His last Abilify  stable dose was 30mg  daily. Speech has been improving with medications so far, but still mildly disorganized and perseverative. Has shown no dangerous thoughts or actions toward others, and his perseveration has a strong sense of wanting to leave people alone. States clearly that while he has no intentions of harming others, he will absolutely defend himself if he feels threatened.   If Abilify  is helpful at 30mg  daily, could have LAI on Friday and DC possibly this weekend.  # Schizophrenia             - Abilify  Maintena Initiation   - 400mg  Injection on 1/16, followed by 400mg  injections every 28 days   - Continue Oral Abilify  20mg  daily starting 1/16, for 14 days.             - Stopped Olanzapine  10mg  nightly    Psychiatry General PRNs             -- Trazodone  50 mg once nightly as needed for insomnia             -- Hydroxyzine  25 mg 3 times daily as needed for anxiety             -- Agitation protocol: Haldol , Benadryl , lorazepam     Medical Issues Being Addressed:  #Eczema  - Continue hydrocortisone  1% cream PRN for skin irritation  - Continue Aquaphor PRN for skin irritation  # Asthma # COPD              - Continue home Breztri              - Continue home Albuterol    # HLD             - Continue home Atorvastatin  20mg  daily              # HTN             - Continue home Coreg  3.125mg              - Clonodine PRN   # Allergies             - Continue home Claratin  - Continue Clear Eyes Redness eye drops for eye irritation   Medical General PRNs             - Tylenol  tablets 650 mg every 6 hours as needed for pain - Maalox/Mylanta suspension 30 mL every 4 hours as needed for indigestion - Milk of Magnesia 30 mL daily as needed for constipation    Risk Assessment - mild. Is denying SI, but is still disorganized.  Discharge Planning Barriers to discharge: Active psychosis Estimated length of stay: 7-10 days Predicted Discharge location: [name]   Interval History and update:   02/21/24  No significant events overnight. Vital  Signs are Stable with BP 124/99. MAR was reviewed and patient was compliant with medications yesterday. The did  participate in groups yesterday. They received  PRN clonodine yesterday. They spent 48% of their time in the bedroom and slept 8.25 hours over the last 24 hours. Case was discussed in the multidisciplinary team.   On interview patient reports that is is doing well. Denies SI, HI, AH, VH. Was organized enough to call for housing and said that there is a viable option with [name] (SW to verify). He did not bring up any of his perseverations, and was well composed. Eyes still red, was not aware of available PRNs. We discussed an LAI to which he agreed.  Discussed plan with patient to which they were agreeable. Pt questions were answered.  Physical Exam MSK/Neuro - Normal gait and station   Mental Status Exam  Apperance: Appropriate for environment and Sitting upright Behavior: Calm Speech: Normal Rate, Articulate, Normal Volume, and TALKATIVE Attitude: Cooperative and Friendly Mood: good Affect:  Euthymic, Normal Range, and Mood Congruent Perception: Not responding to internal stimuli Thought Content: within normal limits Thought Form: Goal Directed, Organized, and Logical Cognition: Alert & Oriented to person, place, and time, Recent and Remote memory grossly intact by recounting personal history, and Immediate memory grossly intact by interview Judgment: Fair Insight: Fair    Lab Results:  Admission on 02/15/2024  Component Date Value Ref Range Status   TSH 02/16/2024 4.120  0.350 - 4.500 uIU/mL Final   Cholesterol 02/16/2024 131  0 - 200 mg/dL Final   Triglycerides 98/89/7973 104  <150 mg/dL Final   HDL 98/89/7973 37 (L)  >40 mg/dL Final   Total CHOL/HDL Ratio 02/16/2024 3.5  RATIO Final   VLDL 02/16/2024 21  0 - 40 mg/dL Final   LDL Cholesterol 02/16/2024 73  0 - 99 mg/dL Final   Hgb J8r MFr Bld 02/16/2024 5.3  4.8 - 5.6 % Final   Mean Plasma Glucose 02/16/2024 105.41  mg/dL Final    Qtc: 428ms J8r: 5.3 Lipid Panel: Cholesterol 131, LDL 73  Vitals: Blood pressure (!) 124/99, pulse 68, temperature 98.7 F (37.1 C), temperature source Oral, resp. rate 20, height 5' 9 (1.753 m), weight 91.8 kg, SpO2 98%.   DSABRA Penne Mori, DO, PGY1 [facility]

## 2024-02-21 NOTE — Progress Notes (Signed)
 Patient denies SI, HI, AVH. Patient stated they slept Good last night. Scored 0/10 on anxiety, 1/10 on feeling of hopelessness, and 2/10 on depression. Patient goal is Home plan/job and states will Talk to soc. Worker to help reach that goal. Patient has been calm, cooperative, and med compliant.    02/21/24 1000  Psych Admission Type (Psych Patients Only)  Admission Status Involuntary  Psychosocial Assessment  Patient Complaints Depression  Eye Contact Fair  Facial Expression Animated  Affect Appropriate to circumstance  Speech Logical/coherent  Interaction Assertive  Motor Activity Other (Comment) (WDL)  Appearance/Hygiene Unremarkable  Behavior Characteristics Cooperative  Mood Pleasant  Thought Process  Coherency WDL  Content WDL  Delusions None reported or observed  Perception WDL  Hallucination None reported or observed  Judgment Poor  Confusion None  Danger to Self  Current suicidal ideation? Denies  Agreement Not to Harm Self Yes  Description of Agreement Verbal  Danger to Others  Danger to Others None reported or observed

## 2024-02-22 ENCOUNTER — Encounter (HOSPITAL_COMMUNITY): Payer: Self-pay

## 2024-02-22 NOTE — Group Note (Signed)
 Date:  02/22/2024 Time:  2:35 PM  Group Topic/Focus: Guess that song This song speaks to the quiet exhaustion of adulthood, where responsibilities pile up and emotions are often hidden behind a practiced smile. It reflects the struggle of feeling lost, overwhelmed, and pressured to appear okay while internally battling anxiety and self-doubt. Through honest lyrics and a steady rhythm, the music reminds listeners that its normal to feel broken sometimes--and that asking for help is not a weakness.    Participation Level:  Active  Participation Quality:  Appropriate  Affect:  Angry and Appropriate  Cognitive:  Alert and Appropriate  Insight: Appropriate  Engagement in Group:                Engaged  Modes of Intervention:  Exploration  Additional Comments:    Wesley Beck 02/22/2024, 2:35 PM

## 2024-02-22 NOTE — BH IP Treatment Plan (Signed)
 Interdisciplinary Treatment and Diagnostic Plan Update  02/22/2024 Time of Session: 11:10 AM Wesley Beck MRN: [id number]  Principal Diagnosis: Schizophrenia, disorganized (HCC)  Secondary Diagnoses: Principal Problem:   Schizophrenia, disorganized (HCC)   Current Medications:  Current Facility-Administered Medications  Medication Dose Route Frequency Provider Last Rate Last Admin   acetaminophen  (TYLENOL ) tablet 650 mg  650 mg Oral Q6H PRN White, [name], NP   650 mg at 02/20/24 9078   albuterol  (VENTOLIN  HFA) 108 (90 Base) MCG/ACT inhaler 1-2 puff  1-2 puff Inhalation Q4H PRN White, [name], NP   1 puff at 02/21/24 2120   alum & mag hydroxide-simeth (MAALOX/MYLANTA) 200-200-20 MG/5ML suspension 30 mL  30 mL Oral Q4H PRN White, [name], NP       ARIPiprazole  (ABILIFY ) tablet 20 mg  20 mg Oral Daily Prunty, Donald B, DO   20 mg at 02/22/24 0802   ARIPiprazole  ER (ABILIFY  MAINTENA) injection 400 mg  400 mg Intramuscular Q28 days Prunty, Donald B, DO       atorvastatin  (LIPITOR) tablet 20 mg  20 mg Oral Daily Prunty, Donald B, DO   20 mg at 02/22/24 9195   budesonide -glycopyrrolate -formoterol  (BREZTRI ) 160-9-4.8 MCG/ACT inhaler 2 puff  2 puff Inhalation BID White, [name], NP   2 puff at 02/22/24 0801   carvedilol  (COREG ) tablet 3.125 mg  3.125 mg Oral BID WC White, [name], NP   3.125 mg at 02/22/24 1716   cloNIDine  (CATAPRES ) tablet 0.1 mg  0.1 mg Oral TID PRN Towana Leita SAILOR, MD   0.1 mg at 02/20/24 1653   haloperidol  (HALDOL ) tablet 5 mg  5 mg Oral TID PRN Teresa Jes L, NP       And   diphenhydrAMINE  (BENADRYL ) capsule 50 mg  50 mg Oral TID PRN White, [name], NP       haloperidol  lactate (HALDOL ) injection 5 mg  5 mg Intramuscular TID PRN White, [name], NP       And   diphenhydrAMINE  (BENADRYL ) injection 50 mg  50 mg Intramuscular TID PRN White, [name], NP       And   LORazepam  (ATIVAN ) injection 2 mg  2 mg Intramuscular TID PRN White, [name], NP        haloperidol  lactate (HALDOL ) injection 10 mg  10 mg Intramuscular TID PRN White, [name], NP       And   diphenhydrAMINE  (BENADRYL ) injection 50 mg  50 mg Intramuscular TID PRN White, [name], NP       And   LORazepam  (ATIVAN ) injection 2 mg  2 mg Intramuscular TID PRN White, [name], NP       hydrocortisone  cream 1 %   Topical TID Lera Golas B, DO   Given at 02/22/24 1716   hydrOXYzine  (ATARAX ) tablet 25 mg  25 mg Oral TID PRN White, [name], NP   25 mg at 02/19/24 1200   loratadine  (CLARITIN ) tablet 10 mg  10 mg Oral Daily Prunty, Donald B, DO   10 mg at 02/22/24 0805   magnesium  hydroxide (MILK OF MAGNESIA) suspension 30 mL  30 mL Oral Daily PRN White, [name], NP       mineral oil-hydrophilic petrolatum (AQUAPHOR) ointment   Topical Daily PRN Elodie Palma, MD   Given at 02/22/24 1716   naphazoline-glycerin  (CLEAR EYES REDNESS) ophth solution 1-2 drop  1-2 drop Both Eyes QID PRN Elodie Palma, MD   2 drop at 02/22/24 1147  nicotine  (NICODERM CQ  - dosed in mg/24 hours) patch 21 mg  21 mg Transdermal Daily White, [name], NP   21 mg at 02/22/24 9198   nicotine  polacrilex (NICORETTE ) gum 2 mg  2 mg Oral PRN Pashayan, Alexander S, DO   2 mg at 02/20/24 2057   ondansetron  (ZOFRAN ) tablet 4 mg  4 mg Oral Q8H PRN White, [name], NP       PTA Medications: Medications Prior to Admission  Medication Sig Dispense Refill Last Dose/Taking   B Complex-C (B-COMPLEX WITH VITAMIN C) tablet Take 1 tablet by mouth daily.   Taking   Cholecalciferol 25 MCG (1000 UT) tablet Take 1,000 Units by mouth daily.   Taking   magnesium  oxide (MAG-OX) 400 (240 Mg) MG tablet Take 400 mg by mouth daily.   Taking   Multiple Vitamins-Minerals (MULTIVITAMIN WITH MINERALS) tablet Take 1 tablet by mouth daily.   Taking   albuterol  (VENTOLIN  HFA) 108 (90 Base) MCG/ACT inhaler Inhale 1-2 puffs into the lungs every 4 (four) hours as needed for wheezing or shortness of breath. 6.7 g 1    atorvastatin   (LIPITOR) 20 MG tablet Take 1 tablet (20 mg total) by mouth See admin instructions. Take it on Sunday, Tuesday, Wednesday, Thursday, and Saturday as needed for high cholesterol. 30 tablet 0    carvedilol  (COREG ) 3.125 MG tablet Take 1 tablet (3.125 mg total) by mouth 2 (two) times daily with a meal. 60 tablet 2    Coenzyme Q10 (COQ10 PO) Take 1 tablet by mouth 3 (three) times a week. Monday, Wednesday, and Friday      loratadine  (CLARITIN ) 10 MG tablet Take 1 tablet (10 mg total) by mouth daily as needed for allergies. 30 tablet 0    meloxicam (MOBIC) 7.5 MG tablet Take 7.5 mg by mouth 2 (two) times daily.      OLANZapine  (ZYPREXA ) 10 MG tablet Take 1 tablet (10 mg total) by mouth at bedtime. 90 tablet 3    rosuvastatin  (CRESTOR ) 10 MG tablet Take 10 mg by mouth 2 (two) times a week. Monday and Friday      thiamine  (VITAMIN B1) 100 MG tablet Take 100 mg by mouth daily.      TRELEGY ELLIPTA 100-62.5-25 MCG/ACT AEPB Take 1 puff by mouth daily.      zinc gluconate 50 MG tablet Take 50 mg by mouth daily.       Patient Stressors: Other: hopelessness    Patient Strengths: Other: to read and understand the word to tell the truth from a lie  Treatment Modalities: Medication Management, Group therapy, Case management,  1 to 1 session with clinician, Psychoeducation, Recreational therapy.   Physician Treatment Plan for Primary Diagnosis: Schizophrenia, disorganized (HCC) Long Term Goal(s):     Short Term Goals:    Medication Management: Evaluate patient's response, side effects, and tolerance of medication regimen.  Therapeutic Interventions: 1 to 1 sessions, Unit Group sessions and Medication administration.  Evaluation of Outcomes: Progressing  Physician Treatment Plan for Secondary Diagnosis: Principal Problem:   Schizophrenia, disorganized (HCC)  Long Term Goal(s):     Short Term Goals:       Medication Management: Evaluate patient's response, side effects, and tolerance of  medication regimen.  Therapeutic Interventions: 1 to 1 sessions, Unit Group sessions and Medication administration.  Evaluation of Outcomes: Progressing   RN Treatment Plan for Primary Diagnosis: Schizophrenia, disorganized (HCC) Long Term Goal(s): Knowledge of disease and therapeutic regimen to maintain health will improve  Short  Term Goals: Ability to remain free from injury will improve, Ability to verbalize frustration and anger appropriately will improve, Ability to verbalize feelings will improve, and Ability to disclose and discuss suicidal ideas  Medication Management: RN will administer medications as ordered by provider, will assess and evaluate patient's response and provide education to patient for prescribed medication. RN will report any adverse and/or side effects to prescribing provider.  Therapeutic Interventions: 1 on 1 counseling sessions, Psychoeducation, Medication administration, Evaluate responses to treatment, Monitor vital signs and CBGs as ordered, Perform/monitor CIWA, COWS, AIMS and Fall Risk screenings as ordered, Perform wound care treatments as ordered.  Evaluation of Outcomes: Progressing   LCSW Treatment Plan for Primary Diagnosis: Schizophrenia, disorganized (HCC) Long Term Goal(s): Safe transition to appropriate next level of care at discharge, Engage patient in therapeutic group addressing interpersonal concerns.  Short Term Goals: Engage patient in aftercare planning with referrals and resources, Increase ability to appropriately verbalize feelings, Facilitate acceptance of mental health diagnosis and concerns, and Identify triggers associated with mental health/substance abuse issues  Therapeutic Interventions: Assess for all discharge needs, 1 to 1 time with Social worker, Explore available resources and support systems, Assess for adequacy in community support network, Educate family and significant other(s) on suicide prevention, Complete Psychosocial  Assessment, Interpersonal group therapy.  Evaluation of Outcomes: Progressing   Progress in Treatment: Attending groups: Yes. Participating in groups: Yes. Taking medication as prescribed: Yes. Toleration medication: Yes. Family/Significant other contact made: No, will contact:  patient declined to provide consents at this time Patient understands diagnosis: Yes. Discussing patient identified problems/goals with staff: Yes. Medical problems stabilized or resolved: Yes. Denies suicidal/homicidal ideation: Yes. Issues/concerns per patient self-inventory: No. None reported.   New problem(s) identified: No, Describe:  None identified.   New Short Term/Long Term Goal(s):  medication stabilization, elimination of SI thoughts, development of comprehensive mental wellness plan.     Patient Goals: Gain sanity and keep it   Discharge Plan or Barriers: Patient recently admitted. CSW will continue to follow and assess for appropriate referrals and possible discharge planning.     Reason for Continuation of Hospitalization: Delusions  Medication stabilization   Estimated Length of Stay: 2 - 3 days.  Last 3 [place] Suicide Severity Risk Score: Flowsheet Row [facility] (Current[facility] 02/14/2024 in [facility] Department [facility] ED from 12/30/2023 in [facility] Emergency Department at [facility]  C-SSRS RISK CATEGORY Moderate Risk Moderate Risk No Risk    Last PHQ 2/9 Scores:    09/06/2023   10:38 AM  Depression screen PHQ 2/9  Decreased Interest 0  Down, Depressed, Hopeless 0  PHQ - 2 Score 0    Scribe for Treatment Team: Doyne Ellinger O Chayce Rullo, LCSW 02/22/2024 6:01 PM

## 2024-02-22 NOTE — Group Note (Signed)
 Date:  02/22/2024 Time:  11:32 AM  Group Topic/Focus: Recreational therapy Word Scramble is a recreational therapy activity that helps adults with mental health conditions improve critical thinking skills. In this activity, participants rearrange scrambled letters to form meaningful words, which encourages problem-solving, decision-making, and concentration. Word Scramble also helps improve attention and memory while reducing stress in a fun and supportive environment. By working individually or in groups, participants practice logical thinking and communication skills, making this activity an effective and engaging tool in recreational therapy.    Participation Level:  Active   Wesley Beck Wesley Beck Wesley Beck 02/22/2024, 11:32 AM

## 2024-02-22 NOTE — Group Note (Signed)
 Date:  02/22/2024 Time:  6:53 PM  Group Topic/Focus:  Making Healthy Choices:   The focus of this group is to help patients identify negative/unhealthy choices they were using prior to admission and identify positive/healthier diet choices to promote brain health.   Participation Level:  Active  Participation Quality:  Appropriate  Affect:  Appropriate  Cognitive:  Appropriate  Insight: Appropriate  Engagement in Group:  Engaged  Modes of Intervention:  Discussion and Education  Additional Comments:    Wesley Beck 02/22/2024, 6:53 PM

## 2024-02-22 NOTE — Plan of Care (Signed)
 Pt was out in the milieu during the day acting appropriately. Went to [employer] and ate adequately. Attending group activity and participated. Denies SI/HI/SH/paranoia/AVH. Will continue to monitor.

## 2024-02-22 NOTE — Progress Notes (Signed)
(  Sleep Hours) -8.75 as of 0530 (Any PRNs that were needed, meds refused, or side effects to meds)- prn @ 2125 inhaler, eye gtts, and ointment (Any disturbances and when (visitation, over night)-none (Concerns raised by the patient)- none (SI/HI/AVH)- denies all

## 2024-02-22 NOTE — Hospital Course (Signed)
 Wesley Beck is a 54 y.o. male  with a past psychiatric history of Schizoaffective bipolar disorder, polysubstance use. Patient initially arrived to [facility] on 1/8 for psychotic behavior, and admitted to [facility] under IVC on 1/9 for impaired functioning. PMHx is significant for Asthma, Back pain, HTN, HLD.   He was reportedly on Zyprexa  for many years, but no longer wanted to be on it because of side effects of feeling like he was moving too slowly. He reported being on Abilify  30mg  in the past to much success, so we restarted Abilify  and titrated it up to 30mg  daily. His symptoms improved, and he was given an LAI on 1/16, with intended 14 day overlap of Abilify  20mg  daily, and Q28day 400mg  Abilify  Maintenna injections.  His symptoms seemed consistent with schizophrenia disorganization with a perseveration on the same past family conversations. He was irritable, but never aggressive, and talked quickly, but was interruptable. Each of these symptoms improved with increased Abilfiy dosages, and his distress was much lower after the LAI.

## 2024-02-22 NOTE — Progress Notes (Signed)
 [facility] Inpatient Psychiatry Progress Note  Date: 02/22/24 Patient: Wesley Beck MRN: [id number]  Assessment and Plan: QUINLAN VOLLMER is a 54 y.o. male a past psychiatric history of Schizoaffective bipolar disorder, polysubstance use. Patient initially arrived to [facility] on 1/8 for psychotic behavior, and admitted to [facility] under IVC on 1/9 for impaired functioning. PMHx is significant for Asthma, Back pain, HTN, HLD.   Patient perseveration has greatly improved, is still mildly disorganized. Patient has moderate eczema on face and neck causing irritation. Scheduled for Abilify  Maintena initiation 1/16, and will have 14 day overlap.  Has shown no dangerous thoughts or actions toward others.  States clearly that while he has no intentions of harming others. If continues to tolerate Abilify , can DC as soon as 1/17.   # Schizophrenia             - Abilify  Maintena Initiation   - 400mg  Injection on 1/16, followed by 400mg  injections every 28 days   - Continue Oral Abilify  20mg  daily starting 1/16, for 14 days.             - Stopped Olanzapine  10mg  nightly    Psychiatry General PRNs             -- Trazodone  50 mg once nightly as needed for insomnia             -- Hydroxyzine  25 mg 3 times daily as needed for anxiety             -- Agitation protocol: Haldol , Benadryl , lorazepam     Medical Issues Being Addressed:  #Eczema  - Continue hydrocortisone  1% cream PRN for skin irritation  - Continue Aquaphor PRN for skin irritation  # Asthma # COPD             - Continue home Breztri              - Continue home Albuterol    # HLD             - Continue home Atorvastatin  20mg  daily              # HTN             - Continue home Coreg  3.125mg              - Clonodine PRN   # Allergies             - Continue home Claratin  - Continue Clear Eyes Redness eye drops for eye irritation   Medical General PRNs             - Tylenol  tablets 650 mg every 6 hours as  needed for pain - Maalox/Mylanta suspension 30 mL every 4 hours as needed for indigestion - Milk of Magnesia 30 mL daily as needed for constipation    Risk Assessment - mild. Is denying SI, but is still disorganized.  Discharge Planning Barriers to discharge: Active psychosis Estimated length of stay: 7-10 days Predicted Discharge location: [name]   Interval History and update:   02/22/24  No significant events overnight. Vital Signs are Stable. MAR was reviewed and patient was compliant with medications yesterday. They did  participate in groups yesterday. They received  PRN albuterol  and eye drops yesterday. They spent 47% of their time in the bedroom and slept 8.75 hours over the last 24 hours. Case was discussed in the multidisciplinary team.   On interview patient reports that he is doing well.  He had called [name] yesterday, and was asked to call again, but forgot to call again. Says he will reach out today. Understands that if he can't get into a transitional house, that he would likely DC to shelter with resources to try to get in somewhere on his own.   Discussed plan with patient to which they were agreeable. Pt questions were answered.  Physical Exam MSK/Neuro - Normal gait and station   Mental Status Exam  Apperance: Appropriate for environment and Sitting upright Behavior: Calm Speech: Normal Rate, Articulate, and Normal Volume Attitude: Cooperative and Friendly Mood: good Affect: Euthymic, Normal Range, and Mood Congruent Perception: Not responding to internal stimuli Thought Content: within normal limits Thought Form: Goal Directed, Organized, and Logical Cognition: Alert & Oriented to person, place, and time, Recent and Remote memory grossly intact by recounting personal history, and Immediate memory grossly intact by interview Judgment: Fair Insight: Fair    Lab Results:  Admission on 02/15/2024  Component Date Value Ref  Range Status   TSH 02/16/2024 4.120  0.350 - 4.500 uIU/mL Final   Cholesterol 02/16/2024 131  0 - 200 mg/dL Final   Triglycerides 98/89/7973 104  <150 mg/dL Final   HDL 98/89/7973 37 (L)  >40 mg/dL Final   Total CHOL/HDL Ratio 02/16/2024 3.5  RATIO Final   VLDL 02/16/2024 21  0 - 40 mg/dL Final   LDL Cholesterol 02/16/2024 73  0 - 99 mg/dL Final   Hgb J8r MFr Bld 02/16/2024 5.3  4.8 - 5.6 % Final   Mean Plasma Glucose 02/16/2024 105.41  mg/dL Final    Qtc: 428ms J8r: 5.3 Lipid Panel: Cholesterol 131, LDL 73  Vitals: Blood pressure 114/81, pulse 77, temperature (!) 97.5 F (36.4 C), temperature source Oral, resp. rate 14, height 5' 9 (1.753 m), weight 91.8 kg, SpO2 99%.   DSABRA Penne Mori, DO, PGY1 [facility] Psychiatry Residency [facility]

## 2024-02-22 NOTE — Group Note (Signed)
 Recreation Therapy Group Note   Group Topic:Problem Solving  Group Date: 02/22/2024 Start Time: 0935 End Time: 1005 Facilitators: Jimia Gentles-McCall, LRT,CTRS Location: [address] Dayroom   Group Topic: Problem Solving  Goal Area(s) Addresses:  Patient will effectively work in a team with other group members. Patient will verbalize importance of using appropriate problem solving techniques.  Patient will identify positive change associated with effective problem solving skills.   Behavioral Response: Engaged  Intervention: Worksheet  Activity: Dentist. Patients worked together to complete a front and back worksheet of brain teasers. Patients had to work through different thoughts and ideas to come up with the correct answer. Once patients finished, LRT and patients went over the answers.    Education: Problem solving, Team Building, Communication  Education Outcome: Acknowledges understanding/In group clarification offered/Needs additional education.    Affect/Mood: Appropriate   Participation Level: Active   Participation Quality: Independent   Behavior: Appropriate   Speech/Thought Process: Relevant   Insight: Moderate   Judgement: Moderate   Modes of Intervention: Worksheet   Patient Response to Interventions:  Attentive   Education Outcome:  In group clarification offered    Clinical Observations/Individualized Feedback: Pt attended and participated in group.      Plan: Continue to engage patient in RT group sessions 2-3x/week.   Durelle Zepeda-McCall, LRT,CTRS 02/22/2024 12:26 PM

## 2024-02-22 NOTE — Group Note (Signed)
 Date:  02/22/2024 Time:  11:06 AM  Group Topic/Focus: Goals and orientation Goals Group:   The focus of this group is to help patients establish daily goals to achieve during treatment and discuss how the patient can incorporate goal setting into their daily lives to aide in recovery. Orientation:   The focus of this group is to educate the patient on the purpose and policies of crisis stabilization and provide a format to answer questions about their admission.  The group details unit policies and expectations of patients while admitted.    Participation Level:  Active  Participation Quality:  Appropriate  Affect:  Appropriate  Cognitive:  Alert  Insight: Appropriate  Engagement in Group:  Engaged  Modes of Intervention:  Orientation  Additional Comments:    Wesley Beck 02/22/2024, 11:06 AM

## 2024-02-22 NOTE — Group Note (Deleted)
 Date:  02/22/2024 Time:  8:41 PM  Group Topic/Focus:  Wrap-Up Group:   The focus of this group is to help patients review their daily goal of treatment and discuss progress on daily workbooks.     Participation Level:  {BHH PARTICIPATION OZCZO:77735}  Participation Quality:  {BHH PARTICIPATION QUALITY:22265}  Affect:  {BHH AFFECT:22266}  Cognitive:  {BHH COGNITIVE:22267}  Insight: {BHH Insight2:20797}  Engagement in Group:  {BHH ENGAGEMENT IN HMNLE:77731}  Modes of Intervention:  {BHH MODES OF INTERVENTION:22269}  Additional Comments:  ***  Nolyn Eilert T Deidrea Gaetz 02/22/2024, 8:41 PM

## 2024-02-22 NOTE — Group Note (Signed)
 Date:  02/22/2024 Time:  9:24 PM  Group Topic/Focus:  Wrap-Up Group:   The focus of this group is to help patients review their daily goal of treatment and discuss progress on daily workbooks.    Participation Level:  Active  Participation Quality:  Appropriate  Affect:  Appropriate  Cognitive:  Appropriate  Insight: Appropriate  Engagement in Group:  Engaged  Modes of Intervention:  Discussion  Additional Comments:   Pt states he was able to participate in recreational activities and programming. Pt denied everything  Wesley Beck Speakman 02/22/2024, 9:24 PM

## 2024-02-23 MED ORDER — MELATONIN 3 MG PO TABS
3.0000 mg | ORAL_TABLET | Freq: Every day | ORAL | Status: DC
  Administered 2024-02-23 – 2024-02-24 (×2): 3 mg via ORAL
  Filled 2024-02-23: qty 1
  Filled 2024-02-23: qty 7
  Filled 2024-02-23: qty 1

## 2024-02-23 MED ORDER — DIVALPROEX SODIUM 250 MG PO DR TAB
250.0000 mg | DELAYED_RELEASE_TABLET | Freq: Every day | ORAL | Status: DC
  Administered 2024-02-23 – 2024-02-25 (×3): 250 mg via ORAL
  Filled 2024-02-23: qty 1
  Filled 2024-02-23: qty 28
  Filled 2024-02-23 (×2): qty 1
  Filled 2024-02-23: qty 7

## 2024-02-23 MED ORDER — DIVALPROEX SODIUM 500 MG PO DR TAB
750.0000 mg | DELAYED_RELEASE_TABLET | Freq: Every day | ORAL | Status: DC
  Administered 2024-02-23: 750 mg via ORAL
  Filled 2024-02-23: qty 1

## 2024-02-23 NOTE — Group Note (Signed)
 Date:  02/23/2024 Time:  4:23 PM  Group Topic/Focus:  Emotional Education:   The focus of this group is to discuss what feelings/emotions are, and how they are experienced. Wellness Toolbox:   The focus of this group is to discuss various aspects of wellness, balancing those aspects and exploring ways to increase the ability to experience wellness.  Patients will create a wellness toolbox for use upon discharge.    Participation Level:  Active  Participation Quality:  Appropriate  Affect:  Appropriate  Cognitive:  Appropriate  Insight: Appropriate  Engagement in Group:  Engaged  Modes of Intervention:  Activity  Additional Comments:  Pt attended and participated.  Kato Wieczorek 02/23/2024, 4:23 PM

## 2024-02-23 NOTE — Group Note (Unsigned)
 Date:  02/23/2024 Time:  5:59 PM  Group Topic/Focus:  Group used a non- competitive card game to build mindful listening skills and encouraging acceptance and understanding of oneself and peers.     Participation Level:  {BHH PARTICIPATION OZCZO:77735}  Participation Quality:  {BHH PARTICIPATION QUALITY:22265}  Affect:  {BHH AFFECT:22266}  Cognitive:  {BHH COGNITIVE:22267}  Insight: {BHH Insight2:20797}  Engagement in Group:  {BHH ENGAGEMENT IN HMNLE:77731}  Modes of Intervention:  {BHH MODES OF INTERVENTION:22269}  Additional Comments:  ***  Wesley Beck 02/23/2024, 5:59 PM

## 2024-02-23 NOTE — Group Note (Signed)
 Date:  02/23/2024 Time:  1:25 PM  Group Topic/Focus:  Goals Group:   The focus of this group is to help patients establish daily goals to achieve during treatment and discuss how the patient can incorporate goal setting into their daily lives to aide in recovery. Orientation:   The focus of this group is to educate the patient on the purpose and policies of crisis stabilization and provide a format to answer questions about their admission.  The group details unit policies and expectations of patients while admitted.    Participation Level:  Active  Participation Quality:  Appropriate  Affect:  Angry  Cognitive:  Appropriate  Insight: Appropriate  Engagement in Group:  Engaged  Modes of Intervention:  Activity  Additional Comments:  Pt attended group and participated.   Daemien Fronczak 02/23/2024, 1:25 PM

## 2024-02-23 NOTE — Plan of Care (Signed)
   Problem: Education: Goal: Knowledge of [facility] Education information/materials will improve Outcome: Progressing Goal: Verbalization of understanding the information provided will improve Outcome: Progressing

## 2024-02-23 NOTE — Group Note (Signed)
 Date:  02/23/2024 Time:  5:17 PM  Group Topic/Focus:  Wellness Toolbox:   The focus of this group is to discuss various aspects of wellness, balancing those aspects and exploring ways to increase the ability to experience wellness.  Patients will create a wellness toolbox for use upon discharge.  The nurse played a game with the patients that promotes intellectual wellness.     Participation Level:  Active   Wesley Beck 02/23/2024, 5:17 PM

## 2024-02-23 NOTE — Plan of Care (Signed)
   Problem: Safety: Goal: Periods of time without injury will increase Outcome: Progressing

## 2024-02-23 NOTE — Group Note (Signed)
 Date:  02/23/2024 Time:  5:46 PM  Group Topic/Focus:  Group used a non- competitive card game to build mindful listening skills and encourage acceptance and understanding oneself and peers. Patients share, foster empathy, and personal growth in a supportive environment.    Participation Level:  Did Not Attend    Inocente PARAS [facility] 02/23/2024, 5:46 PM

## 2024-02-23 NOTE — Progress Notes (Signed)
" °   02/23/24 0900  Psych Admission Type (Psych Patients Only)  Admission Status Involuntary  Psychosocial Assessment  Patient Complaints Anxiety  Eye Contact Fair  Facial Expression Anxious  Affect Appropriate to circumstance  Speech Logical/coherent  Interaction Assertive  Motor Activity Other (Comment) (level 3 observation)  Appearance/Hygiene Unremarkable  Behavior Characteristics Cooperative  Mood Anxious;Pleasant  Aggressive Behavior  Effect No apparent injury  Thought Process  Coherency WDL  Content WDL  Delusions None reported or observed  Perception WDL  Hallucination None reported or observed  Judgment Impaired  Confusion None  Danger to Self  Current suicidal ideation? Denies  Agreement Not to Harm Self Yes  Description of Agreement agreed to contact staff before acting on harmful thoughts  Danger to Others  Danger to Others None reported or observed    "

## 2024-02-23 NOTE — Progress Notes (Signed)
(  Sleep Hours) - 3.25 (Any PRNs that were needed, meds refused, or side effects to meds)- PRN Tylenol  and PRN Albuterol  (Any disturbances and when (visitation, over night)- None (Concerns raised by the patient)- None (SI/HI/AVH)- Denies. Contracts for safety

## 2024-02-23 NOTE — Progress Notes (Signed)
 [facility] Inpatient Psychiatry Progress Note  Date: 02/23/24 Patient: Wesley Beck MRN: [id number]  Assessment and Plan: Wesley Beck is a 54 y.o. male a past psychiatric history of schizoaffective disorder - bipolar type, and polysubstance use. Patient initially arrived to [facility] on 1/8 for psychotic behavior, and was admitted to [facility] under IVC on 1/9 for impaired functioning. PMHx is significant for asthma, back pain, HTN, HLD.   Patient was noted to have improvement of perseverative thoughts yesterday, however patient has notable recurrence of perseveration today, particularly around his relationship with his father. Patient's thought pattern remains circumstantial and mildly disorganized. This decompensation and symptoms could be secondary to discontinuation of olanzapine . Patient received Abilify  Maintena LAI yesterday 1/16, therefore we will not restart olanzapine . Patient also reporting continued poor sleep. Patient also notably irritable and animated when discussing his father. To address symptoms of irritability, mood dysregulation, and poor sleep we will start Depakote  250 mg qAM + 750 mg at bedtime for mood regulation.  If patient remains behaviorally appropriate on the unit and his sleep improves, we we will still plan for a Monday discharge 1/20.   # Schizophrenia             - Abilify  Maintena Initiation   - 400mg  Injection on 1/16, followed by 400mg  injections every 28 days   - Continue Oral Abilify  20mg  daily starting 1/16 for 14 days.             - Stopped Olanzapine  10mg  nightly  #Irritability / Emotional dysregulation  - Depakote  250 mg qAM + 750 mg at bedtime for mood regulation    Psychiatry General PRNs             -- Trazodone  50 mg once nightly as needed for insomnia             -- Hydroxyzine  25 mg 3 times daily as needed for anxiety             -- Agitation protocol: Haldol , Benadryl , lorazepam     Medical Issues Being Addressed:   #Eczema  - Continue hydrocortisone  1% cream PRN for skin irritation  - Continue Aquaphor PRN for skin irritation  # Asthma # COPD             - Continue home Breztri              - Continue home Albuterol    # HLD             - Continue home Atorvastatin  20mg  daily              # HTN             - Continue home Coreg  3.125mg              - Clonodine PRN   # Allergies             - Continue home Claritin   - Continue Clear Eyes Redness eye drops for eye irritation   Medical General PRNs             - Tylenol  tablets 650 mg every 6 hours as needed for pain - Maalox/Mylanta suspension 30 mL every 4 hours as needed for indigestion - Milk of Magnesia 30 mL daily as needed for constipation    Risk Assessment - Mild. Denies SI, but remains disorganized.  Discharge Planning Barriers to discharge: Active psychosis, mood dysregulation Estimated length of stay: 7-10 days Predicted Discharge location: TBD, likely shelter  Interval History and update:   02/23/24  No significant events overnight. VSS. Patient compliant with meds yesterday and participatory in groups. Patient reported to at 3.25 hours of sleep over the last 24 hours.  On interview, patient reports he was told yesterday by [name] that they had no more beds available. Patient then pulled out a involuntary commitment form, and asks why he was IVC'd  when he walked in here voluntarily. Patient is circumstantial on interview, and becomes visibly irritable and increasingly animated when discussing his father. Patient continues to assert throughout the interview that his father mistreated him and is not the holy Christian man that he appears to be. When asked other questions, patient quickly responds to questions and then begins perseverating on the relationship with his father again. Patient noted to have poor overall sleep last night. States he has never been on sleep medication the past, but did take melatonin which he  found helpful.  Discussed initiating Depakote  for sleep in addition to mood regulation. Patient reports he has taken Depakote  in the past and is amenable to restarting this medication.   Physical Exam MSK/Neuro - Normal gait and station   Mental Status Exam  Apperance: Appropriate for environment and Sitting upright Behavior: animated, irritable when discussing his father Speech: Normal Rate, Articulate, Normal Volume, and TALKATIVE Attitude: Cooperative Mood: good Affect: Euthymic, Normal Range, and Mood Congruent Perception: Not responding to internal stimuli Thought Content: preoccupied with discussing how father mistreats him Thought Form: circumstantial Cognition: Alert & Oriented to person, place, and time, Recent and Remote memory grossly intact by recounting personal history, and Immediate memory grossly intact by interview Judgment: Fair Insight: Fair    Lab Results:  Admission on 02/15/2024  Component Date Value Ref Range Status   TSH 02/16/2024 4.120  0.350 - 4.500 uIU/mL Final   Cholesterol 02/16/2024 131  0 - 200 mg/dL Final   Triglycerides 98/89/7973 104  <150 mg/dL Final   HDL 98/89/7973 37 (L)  >40 mg/dL Final   Total CHOL/HDL Ratio 02/16/2024 3.5  RATIO Final   VLDL 02/16/2024 21  0 - 40 mg/dL Final   LDL Cholesterol 02/16/2024 73  0 - 99 mg/dL Final   Hgb J8r MFr Bld 02/16/2024 5.3  4.8 - 5.6 % Final   Mean Plasma Glucose 02/16/2024 105.41  mg/dL Final    Qtc: 428ms J8r: 5.3 Lipid Panel: Cholesterol 131, LDL 73  Vitals: Blood pressure (!) 129/92, pulse 75, temperature 97.7 F (36.5 C), resp. rate 18, height 5' 9 (1.753 m), weight 91.8 kg, SpO2 99%.   Ashley Gravely, MD, PGY1 [facility] Psychiat[facility] Residency [facility]

## 2024-02-23 NOTE — BHH Group Notes (Signed)
"          BHH LCSW Group Therapy Note  Date/Time:  02/23/2024 10:10am-11:10am  Type of Therapy and Topic:  Group Therapy:  Healthy and Unhealthy Supports  Participation Level:  Did Not Attend   Description of Group:  Patients in this group explored the differences between healthy and unhealthy supports.  Patients identified their current healthy supports as well as supports they wished they had but do not currently have.  Psychoeducation was provided about needing to be part of our own support system and there was a discussion about how important it is to participate in self-support in order to have ownership in progress that is made.  Some patients were opposed to the idea of reducing contact with unhealthy supports and this led to a healthy group discussion.  A demonstration was given about how to set boundaries with family and/or friends, which patients expressed was beneficial.    Therapeutic Goals:   1)  compare healthy versus unhealthy supports and identify individuals' current healthy and unhealthy supports 2)  identify additional examples of each and demonstrate commonalities within the group  3)  discuss importance of adding healthy supports and setting boundaries with unhealthy supports  5)  offer mutual support about how to address unhealthy supports  6)  encourage active participation in group    Summary of Patient Progress:  Patient was invited to group, did not attend.    Therapeutic Modalities:   Psychoeducation Processing  Elgie Crest, LCSW       "

## 2024-02-23 NOTE — Group Note (Signed)
 Date:  02/23/2024 Time:  4:43 PM  Group Topic/Focus:  Group Topic: Physical Wellness  Description: Group focused on physical wellness and its impact on mental health. Education was provided on healthy habits including sleep, nutrition, hydration, exercise, and self-care. Participants were encouraged to discuss how physical health affects mood, stress levels, and overall functioning. Engagement and participation were appropriate.   Participation Level:  Active   Wesley Beck 02/23/2024, 4:43 PM

## 2024-02-24 MED ORDER — DIVALPROEX SODIUM 500 MG PO DR TAB
750.0000 mg | DELAYED_RELEASE_TABLET | Freq: Every day | ORAL | Status: DC
  Administered 2024-02-24: 750 mg via ORAL
  Filled 2024-02-24: qty 1

## 2024-02-24 NOTE — Progress Notes (Signed)
" °   02/24/24 2212  Psych Admission Type (Psych Patients Only)  Admission Status Involuntary  Psychosocial Assessment  Patient Complaints Depression  Eye Contact Fair  Facial Expression Animated  Affect Appropriate to circumstance  Speech Logical/coherent  Interaction Assertive  Motor Activity Other (Comment) (WDL)  Appearance/Hygiene Unremarkable  Behavior Characteristics Appropriate to situation  Mood Depressed;Pleasant  Thought Process  Coherency WDL  Content WDL  Delusions None reported or observed  Perception WDL  Hallucination None reported or observed  Judgment Limited  Confusion None  Danger to Self  Current suicidal ideation? Denies  Agreement Not to Harm Self Yes  Description of Agreement verbal  Danger to Others  Danger to Others None reported or observed    "

## 2024-02-24 NOTE — Progress Notes (Addendum)
 Pt was very loud yelling and cursing in room at bedtime so this staff member went down to room.  Pt angry about not seeing doctor after lunch today.  Roommate had thought he seemed depressed and asked him about that and Pt became increasingly agitated and loud while discussing what he felt was wrong.  Pt states I am not taking any more pills, all they want to do is throw pills at me rather than deal with the cause.  Pt given support and encouragement and assured doctor will be made aware that Pt wishes to talk with him.    02/24/24 0004  Psych Admission Type (Psych Patients Only)  Admission Status Involuntary  Psychosocial Assessment  Patient Complaints Anxiety  Eye Contact Fair  Facial Expression Anxious  Affect Appropriate to circumstance  Speech Pressured;Loud  Interaction Assertive  Motor Activity Other (Comment) (WDL)  Appearance/Hygiene Unremarkable  Behavior Characteristics Appropriate to situation  Mood Labile  Thought Process  Coherency WDL  Content WDL  Delusions None reported or observed  Perception WDL  Hallucination None reported or observed  Judgment Limited  Confusion None  Danger to Self  Current suicidal ideation? Denies  Agreement Not to Harm Self Yes  Description of Agreement verbal  Danger to Others  Danger to Others None reported or observed

## 2024-02-24 NOTE — Group Note (Signed)
 Date:  02/24/2024 Time:  10:11 PM  Group Topic/Focus:  Goals Group:   The focus of this group is to help patients establish daily goals to achieve during treatment and discuss how the patient can incorporate goal setting into their daily lives to aide in recovery. Wrap-Up Group:   The focus of this group is to help patients review their daily goal of treatment and discuss progress on daily workbooks.    Participation Level:  Active  Participation Quality:  Sharing  Affect:  Appropriate  Cognitive:  Appropriate  Insight: Good  Engagement in Group:  Engaged  Modes of Intervention:  Discussion  Additional Comments:  Patient is looking forward to going home this upcoming week.   Kristen H Jnae [place]aston 02/24/2024, 10:11 PM

## 2024-02-24 NOTE — Group Note (Signed)
 Date:  02/24/2024 Time:  5:56 AM  Group Topic/Focus:  Wrap-Up Group:   The focus of this group is to help patients review their daily goal of treatment and discuss progress on daily workbooks.    Participation Level:  Did Not Attend  Participation Quality:  none  Affect:  n/a  Cognitive:  n/a  Insight: None  Engagement in Group:  None  Modes of Intervention:  none  Additional Comments:   Pt did not attend wrap up group  Nadiah Corbit A Shanele Nissan 02/24/2024, 5:56 AM

## 2024-02-24 NOTE — Group Note (Signed)
 Date:  02/24/2024 Time:  10:41 AM  Group Topic/Focus:  Dimensions of Wellness:   The focus of this group is to introduce the topic of wellness and discuss the role each dimension of wellness plays in total health. Emotional Education:   The focus of this group is to discuss what feelings/emotions are, and how they are experienced.    Participation Level:  Active  Participation Quality:  Appropriate  Affect:  Appropriate  Cognitive:  Appropriate  Insight: Appropriate  Engagement in Group:  Improving  Modes of Intervention:  Discussion  Additional Comments:  Pt was an active listener during group  Mylin Gignac A Archie Shea 02/24/2024, 10:41 AM

## 2024-02-24 NOTE — Progress Notes (Signed)
(  Sleep Hours) - 5.75 hours (Any PRNs that were needed, meds refused, or side effects to meds)-  Tylenol  given, Vistaril  given (Any disturbances and when (visitation, over night)- See nursing notes (Concerns raised by the patient)-  Wishes to speak to doctor about symptoms and not knowing if what I see is real, feels that causes are not being addressed, more pills added. (SI/HI/AVH)- Passive SI, alluded to visual hallucinations

## 2024-02-24 NOTE — Group Note (Signed)
 Date:  02/24/2024 Time:  9:00 AM  Group Topic/Focus:  Goals Group:   The focus of this group is to help patients establish daily goals to achieve during treatment and discuss how the patient can incorporate goal setting into their daily lives to aide in recovery.    Participation Level:  Active  Participation Quality:  Appropriate  Affect:  Appropriate  Cognitive:  Appropriate  Insight: Appropriate  Engagement in Group:  Improving  Modes of Intervention:  Discussion  Additional Comments:  Pt was an active listener throughout group.  Talea Manges A Olla Delancey 02/24/2024, 9:00 AM

## 2024-02-24 NOTE — Group Note (Signed)
 Date:  02/24/2024 Time:  3:57 PM  Group Topic/Focus:  Wellness Toolbox:   The focus of this group is to discuss various aspects of wellness, balancing those aspects and exploring ways to increase the ability to experience wellness.  Patients will create a wellness toolbox for use upon discharge.    Participation Level:  Active  Participation Quality:  Appropriate and Attentive  Affect:  Appropriate  Cognitive:  Alert and Appropriate  Insight: Appropriate and Good  Engagement in Group:  Engaged and Supportive  Modes of Intervention:  Discussion and Education   Wesley Beck 02/24/2024, 3:57 PM

## 2024-02-24 NOTE — Progress Notes (Addendum)
 D. Pt presents with a depressed affect, calm, cooperative behavior, less ruminating today- has been visible in the milieu, observed attending groups. Pt reported sleeping well last night, described his appetie as 'good', energy level as 'low', and concentration as 'poor'.  Per pt's self inventory, pt rated his depression,hopelessness and anxiety all 2/10.Pt currently denies SI/HI and AVH and doesn't appear to be responding to internal stimuli.   A. Labs and vitals monitored. Pt given and educated on medications. Pt supported emotionally and encouraged to express concerns and ask questions.   R. Pt remains safe with 15 minute checks. Will continue POC.    02/24/24 0900  Psych Admission Type (Psych Patients Only)  Admission Status Involuntary  Psychosocial Assessment  Patient Complaints Anxiety;Depression  Eye Contact Fair  Facial Expression Anxious  Affect Depressed  Speech Pressured;Logical/coherent  Interaction Assertive  Motor Activity Other (Comment) (steady gait)  Appearance/Hygiene Unremarkable  Behavior Characteristics Cooperative;Appropriate to situation  Mood Anxious;Depressed  Aggressive Behavior  Effect No apparent injury  Thought Process  Coherency WDL  Content WDL  Delusions None reported or observed  Perception WDL  Hallucination None reported or observed  Judgment Limited  Confusion None  Danger to Self  Current suicidal ideation? Denies  Agreement Not to Harm Self Yes  Description of Agreement agreed to contact staff before acting on harmful thoughts  Danger to Others  Danger to Others None reported or observed

## 2024-02-24 NOTE — Progress Notes (Signed)
 [facility] Inpatient Psychiatry Progress Note  Date: 02/24/24 Patient: Wesley Beck MRN: [id number]  Assessment and Plan: Wesley Beck is a 54 y.o. male a past psychiatric history of schizoaffective disorder - bipolar type, and polysubstance use. Patient initially arrived to [facility] on 1/8 for psychotic behavior, and was admitted to [facility] under IVC on 1/9 for impaired functioning. PMHx is significant for asthma, back pain, HTN, HLD.   Patient remains perseverative on poor relationship with father, although he is more redirectable than yesterday. His thought pattern remains circumstantial and moderately disorganized. Patient appears to have improving insight into inappropriateness of his emotional outbursts, and expresses regret after the fact, however appears to continue struggle somewhat with impulse control. Patient's irritability seems primarily directed toward his father; he appears mostly pleasant and polite when interacting with the treatment team. Given that he does not have plans to discharge to his father's home, lesser concern for irritability and inappropriate behavior towards others as long as he maintains distance from his father. He is tolerating Depakote  well without side effects and reports improved sleep last night. Plan to continue current dose. No further medication changes today.  If patient remains behaviorally appropriate on the unit, he can likely discharge to [facility] on Monday 1/19.  # Schizophrenia             - Abilify  Maintena Initiation   - 400mg  Injection on 1/16, followed by 400mg  injections every 28 days   - Continue Oral Abilify  20mg  daily starting 1/16 for 14 days.             - Stopped Olanzapine  10mg  nightly  #Irritability / Emotional dysregulation  - Depakote  250 mg qAM + 750 mg at bedtime for mood regulation    Psychiatry General PRNs             -- Trazodone  50 mg once nightly as needed for insomnia             -- Hydroxyzine  25  mg 3 times daily as needed for anxiety             -- Agitation protocol: Haldol , Benadryl , lorazepam     Medical Issues Being Addressed:  #Eczema  - Continue hydrocortisone  1% cream PRN for skin irritation  - Continue Aquaphor PRN for skin irritation  # Asthma # COPD             - Continue home Breztri              - Continue home Albuterol    # HLD             - Continue home Atorvastatin  20mg  daily              # HTN             - Continue home Coreg  3.125mg              - Clonodine PRN   # Allergies             - Continue home Claritin   - Continue Clear Eyes Redness eye drops for eye irritation   Medical General PRNs             - Tylenol  tablets 650 mg every 6 hours as needed for pain - Maalox/Mylanta suspension 30 mL every 4 hours as needed for indigestion - Milk of Magnesia 30 mL daily as needed for constipation    Risk Assessment - Mild. Denies SI, but remains disorganized.  Discharge Planning Barriers to discharge: Active psychosis, mood dysregulation Estimated length of stay: 7-10 days Predicted Discharge location: TBD, likely shelter  Interval History and update:   02/24/24  No significant events overnight. VSS. Patient compliant with meds yesterday and participatory in groups. Patient reported to at 5.75 hours of sleep over the last 24 hours.  On interview, patient reports his sleep was better last night. Patient continues to perseverate on his relationship with his father and begins talking about how he was on the phone with his father, and then had a brief argument about his father not being able to remember an event that occurred in the past, and patient hung up the phone.  Patient's thoughts are tangential and disorganized. He appears irritable and more animated when discussing his father. When asking about the nursing report that patient was cursing loudly and irritable last night, patient sheepishly expresses regret for his behavior and states he knows he  needs to work on coping skills to not resort to that type of behavior.  Patient denies SI/HI/AVH.  Patient denies medication side effects, but reports he believes Depakote  is making him groggy. States he feels like Abilify  is giving him energy, but the Depakote  is counteracting that.   Physical Exam MSK/Neuro - Normal gait and station   Mental Status Exam  Apperance: Appropriate for environment and Sitting upright Behavior: animated, irritable when discussing his father Speech: Normal Rate, Articulate, Normal Volume, and TALKATIVE Attitude: Cooperative Mood: good Affect: Euthymic, Normal Range, and Mood Congruent Perception: Not responding to internal stimuli Thought Content: preoccupied with discussing how father mistreats him Thought Form: circumstantial Cognition: Alert & Oriented to person, place, and time, Recent and Remote memory grossly intact by recounting personal history, and Immediate memory grossly intact by interview Judgment: Fair Insight: Fair    Lab Results:  Admission on 02/15/2024  Component Date Value Ref Range Status   TSH 02/16/2024 4.120  0.350 - 4.500 uIU/mL Final   Cholesterol 02/16/2024 131  0 - 200 mg/dL Final   Triglycerides 98/89/7973 104  <150 mg/dL Final   HDL 98/89/7973 37 (L)  >40 mg/dL Final   Total CHOL/HDL Ratio 02/16/2024 3.5  RATIO Final   VLDL 02/16/2024 21  0 - 40 mg/dL Final   LDL Cholesterol 02/16/2024 73  0 - 99 mg/dL Final   Hgb J8r MFr Bld 02/16/2024 5.3  4.8 - 5.6 % Final   Mean Plasma Glucose 02/16/2024 105.41  mg/dL Final    Qtc: 428ms J8r: 5.3 Lipid Panel: Cholesterol 131, LDL 73  Vitals: Blood pressure 117/85, pulse 64, temperature 97.7 F (36.5 C), resp. rate 18, height 5' 9 (1.753 m), weight 91.8 kg, SpO2 98%.   Ashley Gravely, MD, PGY1 [facility] Psychiatry Residency Program

## 2024-02-24 NOTE — Group Note (Signed)
 Date:  02/24/2024 Time:  6:47 PM  Group Topic/Focus:  Wellness Toolbox:   The focus of this group is to discuss various aspects of wellness, balancing those aspects and exploring ways to increase the ability to experience wellness.  Patients will create a wellness toolbox for use upon discharge.    Participation Level:  Active  Participation Quality:  Attentive  Affect:  Appropriate  Cognitive:  Appropriate  Insight: Appropriate  Engagement in Group:  Engaged  Modes of Intervention:  Discussion and Education  Additional Comments:    Juliene CHRISTELLA Huddle 02/24/2024, 6:47 PM

## 2024-02-24 NOTE — Plan of Care (Signed)
 ?  Problem: Education: ?Goal: Mental status will improve ?Outcome: Progressing ?Goal: Verbalization of understanding the information provided will improve ?Outcome: Progressing ?  ?

## 2024-02-25 DIAGNOSIS — F200 Paranoid schizophrenia: Secondary | ICD-10-CM

## 2024-02-25 MED ORDER — CARVEDILOL 3.125 MG PO TABS
6.2500 mg | ORAL_TABLET | Freq: Two times a day (BID) | ORAL | Status: DC
  Filled 2024-02-25: qty 28

## 2024-02-25 MED ORDER — ARIPIPRAZOLE ER 400 MG IM SRER: 400.0000 mg | INTRAMUSCULAR | 0 refills | Status: AC

## 2024-02-25 MED ORDER — BUDESON-GLYCOPYRROL-FORMOTEROL 160-9-4.8 MCG/ACT IN AERO: 2.0000 | INHALATION_SPRAY | Freq: Two times a day (BID) | RESPIRATORY_TRACT | 0 refills | Status: AC

## 2024-02-25 MED ORDER — DIVALPROEX SODIUM 250 MG PO DR TAB: 750.0000 mg | DELAYED_RELEASE_TABLET | Freq: Every day | ORAL | 0 refills | Status: AC

## 2024-02-25 MED ORDER — HYDROCORTISONE 1 % EX CREA: TOPICAL_CREAM | Freq: Three times a day (TID) | CUTANEOUS | 0 refills | Status: AC

## 2024-02-25 MED ORDER — CARVEDILOL 6.25 MG PO TABS: 6.2500 mg | ORAL_TABLET | Freq: Two times a day (BID) | ORAL | 0 refills | Status: AC

## 2024-02-25 MED ORDER — ATORVASTATIN CALCIUM 20 MG PO TABS: 20.0000 mg | ORAL_TABLET | Freq: Every day | ORAL | 0 refills | Status: AC

## 2024-02-25 MED ORDER — DIVALPROEX SODIUM 250 MG PO DR TAB: 250.0000 mg | DELAYED_RELEASE_TABLET | Freq: Every day | ORAL | 0 refills | Status: AC

## 2024-02-25 MED ORDER — ARIPIPRAZOLE 20 MG PO TABS: 20.0000 mg | ORAL_TABLET | Freq: Every day | ORAL | 0 refills | Status: AC

## 2024-02-25 NOTE — Progress Notes (Signed)
(  Sleep Hours) - 6.5 hours (Any PRNs that were needed, meds refused, or side effects to meds)-  Vistaril  given (Any disturbances and when (visitation, over night)- None (Concerns raised by the patient)-  None (SI/HI/AVH)-  Denies

## 2024-02-25 NOTE — Discharge Summary (Signed)
 " Physician Discharge Summary Note  Patient:  Wesley Beck is an 54 y.o., male MRN:  [id number] DOB:  [birth year] Patient phone:  [phone] (home)  Patient address:   Wesley [address] [place] [address],  Total Time spent with patient: 45 minutes  Date of Admission:  02/15/2024 Date of Discharge: 02/26/24   Reason for Admission:  Wesley Beck was admitted under involuntary commitment for decompensated psychosis with impaired functioning, characterized by paranoia toward family, disorganized and circumstantial thought processes, perseveration on familial conflict, irritability, and homelessness. He initially presented to [facility] on February 14, 2024, for psychotic behavior and was transferred for inpatient psychiatric stabilization.    Principal Problem: Paranoid schizophrenia [facility]) Discharge Diagnoses: Principal Problem:   Paranoid schizophrenia (HCC)   Past Psychiatric History:  Previous Psychiatric Diagnoses: Schizoaffective Disorder, Schizophrenia, Polysubstance use disorder  Psychiatric Medications: Current Zyprexa  10mg  daily Past Abilify  Hydroxyzine  Risperdal  Ambien  Navane  Benztropine  Perphenazine Trazodone  Psychiatric Hospitalization hx: Multiple throughout [place]  Past Medical History:  Past Medical History:  Diagnosis Date   Arthritis    Asthma    Bipolar 1 disorder (HCC)    Chronic back pain    COPD (chronic obstructive pulmonary disease) (HCC)    Depression    Schizophrenia, paranoid type (HCC)    Seasonal allergies    Suicide attempt (HCC)    23 years ago OD on olanzapine    History reviewed. No pertinent surgical history.  Hospital Course:  On admission, the patient was paranoid, delusional, perseverative on perceived mistreatment by family (his father in particular), and mildly to moderately disorganized, with poor insight and judgment. Medication management focused on transitioning from olanzapine  to aripiprazole , given the patients report of  prior benefit and preference for Abilify  monotherapy. Oral aripiprazole  was gradually titrated, olanzapine  was discontinued, and the patient was successfully initiated on Abilify  Maintena long-acting injectable on February 22, 2024, with a planned oral overlap.  Mid-hospitalization, the patient demonstrated increased irritability, emotional dysregulation, poor sleep, and worsening perseveration following olanzapine  discontinuation. Valproic acid (Depakote ) was initiated for mood regulation and behavioral stabilization, with subsequent improvement in irritability, sleep, and behavioral control.  By discharge, the patient was calmer, more redirectable, and closer to his psychiatric baseline. Thought processes remained somewhat circumstantial with persistent negative affect toward family, but without overt psychosis, aggression, or safety-threatening behaviors. He consistently denied suicidal and homicidal ideation and was able to engage in discharge planning. Social work contacted his family for collateral information and discharge planning purposes, and he was not able to return home at the time of discharge. However, he was provided transportation to his home address in order to obtain his belongings and planned on staying with a friend and neighbor for the time being.  Physical Exam Vitals reviewed.  Constitutional:      Appearance: Normal appearance. He is normal weight.  HENT:     Head: Normocephalic and atraumatic.  Pulmonary:     Effort: Pulmonary effort is normal.  Skin:    General: Skin is warm and dry.  Neurological:     General: No focal deficit present.     Mental Status: He is alert and oriented to person, place, and time.    Review of Systems  Constitutional: Negative.   Respiratory: Negative.    Cardiovascular: Negative.    Mental Status Exam: Appearance - Casually dressed, appropriate hygiene and grooming  Eye-Contact - Normal Attitude - Calm, polite, not guarded Speech -  normal volume, prosody, inflection Mood - Okay Affect - Mildly  restricted, without significant irritability or lability Thought Process - Linear to circumferential, goal-directed, generally logical Thought Content - No delusional TC expressed, though patient continues to perseverate on perceived relationship issues with family SI/HI - Denies  Perceptions - Denies AVH; not RIS Judgement/Insight - limited Fund of knowledge - WNL Language - No impairments   Blood pressure (!) 148/95, pulse 67, temperature (!) 97.3 F (36.3 C), temperature source Oral, resp. rate 18, height 5' 9 (1.753 m), weight 91.8 kg, SpO2 96%. Body mass index is 29.89 kg/m.   Tobacco Use History[1] Tobacco Cessation:  A prescription for an FDA-approved tobacco cessation medication was offered at discharge and the patient refused   Blood Alcohol level:  Lab Results  Component Value Date   [facility] <15 02/14/2024   ETH <15 12/28/2023    Metabolic Disorder Labs:  Lab Results  Component Value Date   HGBA1C 5.3 02/16/2024   MPG 105.41 02/16/2024   MPG 93.93 10/08/2021   No results found for: PROLACTIN Lab Results  Component Value Date   CHOL 131 02/16/2024   TRIG 104 02/16/2024   HDL 37 (L) 02/16/2024   CHOLHDL 3.5 02/16/2024   VLDL 21 02/16/2024   LDLCALC 73 02/16/2024   LDLCALC 63 05/08/2022    See Psychiatric Specialty Exam and Suicide Risk Assessment completed by Attending Physician prior to discharge.  Discharge destination:  Home  Is patient on multiple antipsychotic therapies at discharge:  No     Allergies as of 02/25/2024       Reactions   Cat Dander Itching, Other (See Comments)   Sneezing     Dog Epithelium (canis Lupus Familiaris) Itching, Other (See Comments)   Sneezing         Medication List     STOP taking these medications    OLANZapine  10 MG tablet Commonly known as: ZyPREXA        TAKE these medications      Indication  albuterol  108 (90 Base) MCG/ACT  inhaler Commonly known as: VENTOLIN  HFA Inhale 1-2 puffs into the lungs every 4 (four) hours as needed for wheezing or shortness of breath.  Indication: Chronic Obstructive Lung Disease   ARIPiprazole  20 MG tablet Commonly known as: ABILIFY  Take 1 tablet (20 mg total) by mouth daily.  Indication: Schizophrenia   ARIPiprazole  ER 400 MG Srer injection Commonly known as: ABILIFY  MAINTENA Inject 2 mLs (400 mg total) into the muscle every 28 (twenty-eight) days. Start taking on: March 21, 2024  Indication: Schizophrenia   atorvastatin  20 MG tablet Commonly known as: LIPITOR Take 1 tablet (20 mg total) by mouth daily. What changed:  when to take this additional instructions  Indication: High Amount of Fats in the Blood   B-complex with vitamin C tablet Take 1 tablet by mouth daily.  Indication: supplement   budesonide -glycopyrrolate -formoterol  160-9-4.8 MCG/ACT Aero inhaler Commonly known as: BREZTRI  Inhale 2 puffs into the lungs 2 (two) times daily. What changed: Another medication with the same name was added. Make sure you understand how and when to take each.  Indication: Chronic Obstructive Lung Disease   budesonide -glycopyrrolate -formoterol  160-9-4.8 MCG/ACT Aero inhaler Commonly known as: BREZTRI  Inhale 2 puffs into the lungs 2 (two) times daily. What changed: You were already taking a medication with the same name, and this prescription was added. Make sure you understand how and when to take each.  Indication: Chronic Obstructive Lung Disease   carvedilol  6.25 MG tablet Commonly known as: Coreg  Take 1 tablet (6.25 mg total) by mouth  2 (two) times daily with a meal. What changed:  medication strength how much to take  Indication: High Blood Pressure   Cholecalciferol 25 MCG (1000 UT) tablet Take 1,000 Units by mouth daily.  Indication: Vitamin D Deficiency   COQ10 PO Take 1 tablet by mouth 3 (three) times a week. Monday, Wednesday, and Friday  Indication:  supplement   divalproex  250 MG DR tablet Commonly known as: DEPAKOTE  Take 3 tablets (750 mg total) by mouth at bedtime.  Indication: Schizophrenia   divalproex  250 MG DR tablet Commonly known as: DEPAKOTE  Take 1 tablet (250 mg total) by mouth daily.  Indication: Schizophrenia   hydrocortisone  cream 1 % Apply topically 3 (three) times daily.  Indication: Eczema   loratadine  10 MG tablet Commonly known as: CLARITIN  Take 1 tablet (10 mg total) by mouth daily as needed for allergies.  Indication: Hayfever   magnesium  oxide 400 (240 Mg) MG tablet Commonly known as: MAG-OX Take 400 mg by mouth daily.  Indication: Disorder with Low Magnesium  Levels   meloxicam 7.5 MG tablet Commonly known as: MOBIC Take 7.5 mg by mouth 2 (two) times daily.  Indication: Acute Pain   multivitamin with minerals tablet Take 1 tablet by mouth daily.  Indication: supplement   rosuvastatin  10 MG tablet Commonly known as: CRESTOR  Take 10 mg by mouth 2 (two) times a week. Monday and Friday. Takes Lipitor other days of the week. Both prescribed by same NP  Indication: High Amount of Fats in the Blood   thiamine  100 MG tablet Commonly known as: VITAMIN B1 Take 100 mg by mouth daily.  Indication: Deficiency of Vitamin B1   Trelegy Ellipta 100-62.5-25 MCG/ACT Aepb Generic drug: Fluticasone -Umeclidin-Vilant Take 1 puff by mouth daily.  Indication: Asthma   zinc gluconate 50 MG tablet Take 50 mg by mouth daily.  Indication: supplement        Follow-up Information     [facility], [facility]. Go on 02/26/2024.   Why: Please go to this provider on 02/26/24 at 8:00 am for an assessment, to obtain medication management and therapy services, Contact information: [address] [place] [address] [phone]                 Follow-up recommendations:  Activity:  As tolerated Diet:  regular    Signed: Oliva DELENA Salmon, DO 02/26/2024, 9:21 AM           [1]  Social  History Tobacco Use  Smoking Status Every Day   Current packs/day: 0.50   Average packs/day: 0.5 packs/day for 25.0 years (12.5 ttl pk-yrs)   Types: Cigarettes   Passive exposure: Current  Smokeless Tobacco Never   "

## 2024-02-25 NOTE — Progress Notes (Signed)
" °  [facility] Adult Case Management Discharge Plan :  Will you be returning to the same living situation after discharge:  No. At discharge, do you have transportation home?: No. CSW arranged taxi voucher for patient through [employer] for 10:45am.  Do you have the ability to pay for your medications: Yes,  patient has active health insurance.  Release of information consent forms completed and in the chart;  Patient's signature needed at discharge.  Patient to Follow up at:  Follow-up Information     [facility], [facility]. Go on 02/26/2024.   Why: Please go to this provider on 02/26/24 at 8:00 am for an assessment, to obtain medication management and therapy services, Contact information: [address] [place] [address] [phone]                 Next level of care provider has access to [facility] Link:no  Safety Planning and Suicide Prevention discussed: Yes,  completed with Ubaldo Daywalt (father) [phone]     Has patient been referred to the Quitline?: Patient refused referral for treatment  Patient has been referred for addiction treatment: No known substance use disorder.  Louetta Lame, LCSWA 02/25/2024, 10:23 AM "

## 2024-02-25 NOTE — Progress Notes (Signed)
 Patient discharged off unit at 1107. Patient belongings reviewed and acknowledged by patient. AVS and Transition Record reviewed and acknowledged by patient. Safety plan completed by patient, reviewed by nurse with patient and copy provided. Any medications and or prescriptions necessary for discharge addressed and provided to patient. Patient denies SI, plan or intent. Denies HI. Denies AVH. No observed or reported side effects to medication. No observed or reported agitation, aggression, or other acute emotional distress. No reported or observed physical abnormalities or concerns. Patient transportation from facility verified and observed.

## 2024-02-25 NOTE — [facility] Suicide Risk Assessment (Signed)
 [facility] Discharge Suicide Risk Assessment   Principal Problem: Paranoid schizophrenia [facility]) Discharge Diagnoses: Principal Problem:   Paranoid schizophrenia (HCC)   Demographic Factors:  Male, Low socioeconomic status, and Unemployed  Loss Factors: Loss of significant relationship and Financial problems/change in socioeconomic status  Historical Factors: NA  Risk Reduction Factors:   Positive social support  Continued Clinical Symptoms:  Schizophrenia; Substance abuse  Cognitive Features That Contribute To Risk:  Limited insight  Suicide Risk:  Mild:  Suicidal ideation of limited frequency, intensity, duration, and specificity.  There are no identifiable plans, no associated intent, mild dysphoria and related symptoms, good self-control (both objective and subjective assessment), few other risk factors, and identifiable protective factors, including available and accessible social support.   Follow-up Information     Services, [facility]. Go on 02/25/2024.   Why: You have a hospital follow up appointment for medication management and therapy services on 02/25/24 at 10:00 am, in person. Contact information: [address] [place] [address] [phone]                 Oliva DELENA Salmon, DO 02/25/2024, 10:20 AM

## 2024-02-25 NOTE — Transportation (Signed)
 02/25/2024  Wesley Beck DOB: [birth year] MRN: [id number]   RIDER WAIVER AND RELEASE OF LIABILITY  For the purposes of helping with transportation needs, [facility] partners with outside transportation providers (taxi companies, [place], catering manager.) to give [place] patients or other approved people the choice of on-demand rides Public Librarian) to our buildings for non-emergency visits.  By using [employer], I, the person signing this document, on behalf of myself and/or any legal minors (in my care using the [employer]), agree:  Science Writer given to me are supplied by independent, outside transportation providers who do not work for, or have any affiliation with, [employer]. [facility] is not a transportation company. [facility] has no control over the quality or safety of the rides I get using [employer]. [place] has no control over whether any outside ride will happen on time or not. [facility] gives no guarantee on the reliability, quality, safety, or availability on any rides, or that no mistakes will happen. I know and accept that traveling by vehicle (car, truck, SVU, fleeta, bus, taxi, etc.) has risks of serious injuries such as disability, being paralyzed, and death. I know and agree the risk of using [employer] is mine alone, and not [employer]. [employer] are provided as is and as are available. The transportation providers are in charge for all inspections and care of the vehicles used to provide these rides. I agree not to take legal action against [place], its agents, employees, officers, directors, representatives, insurers, attorneys, assigns, successors, subsidiaries, and affiliates at any time for any reasons related directly or indirectly to using [employer]. I also agree not to take legal action against [place] or its affiliates for any injury, death, or damage to property caused by or related to using  [employer]. I have read this Waiver and Release of Liability, and I understand the terms used in it and their legal meaning. This Waiver is freely and voluntarily given with the understanding that my right (or any legal minors) to legal action against [place] relating to [employer] is knowingly given up to use these services.   I attest that I read the Ride Waiver and Release of Liability to Wesley Beck, gave Mr. Gerdeman the opportunity to ask questions and answered the questions asked (if any). I affirm that Wesley Beck then provided consent for assistance with transportation.

## 2024-02-25 NOTE — Group Note (Signed)
 Date:  02/25/2024 Time:  9:52 AM  Group Topic/Focus:  Goals Group:   The focus of this group is to help patients establish daily goals to achieve during treatment and discuss how the patient can incorporate goal setting into their daily lives to aide in recovery.    Participation Level:  Active  Participation Quality:  Appropriate  Affect:  Appropriate  Cognitive:  Appropriate  Insight: Appropriate  Engagement in Group:  Engaged  Modes of Intervention:  Discussion  Additional Comments:  pt is being discharged, pt is looking forward to seeing his granddaughtetr  Nat Rummer 02/25/2024, 9:52 AM

## 2024-02-25 NOTE — Plan of Care (Signed)
   Problem: Education: Goal: Emotional status will improve Outcome: Progressing   Problem: Activity: Goal: Interest or engagement in activities will improve Outcome: Progressing

## 2024-02-25 NOTE — [facility] Suicide Risk Assessment (Signed)
 [facility] INPATIENT:  Family/Significant Other Suicide Prevention Education  Suicide Prevention Education:  Education Completed; Brevyn Ring (father) [phone],  (name of family member/significant other) has been identified by the patient as the family member/significant other with whom the patient will be residing, and identified as the person(s) who will aid the patient in the event of a mental health crisis (suicidal ideations/suicide attempt).  With written consent from the patient, the family member/significant other has been provided the following suicide prevention education, prior to the and/or following the discharge of the patient.  The suicide prevention education provided includes the following: Suicide risk factors Suicide prevention and interventions National Suicide Hotline telephone number [facility] assessment telephone number [facility] Emergency Assistance [address] [place] and/or Residential Mobile Crisis Unit telephone number  Request made of family/significant other to: Remove weapons (e.g., guns, rifles, knives), all items previously/currently identified as safety concern.   Remove drugs/medications (over-the-counter, prescriptions, illicit drugs), all items previously/currently identified as a safety concern.  Bill stated the patient is allowed to discharge to his home located at [address] [place], [place]. Zell denies knowledge of patient access to weapons and agrees to the requests made above. Zell knows who to contact in the event of a mental health crisis.   The family member/significant other verbalizes understanding of the suicide prevention education information provided.  The family member/significant other agrees to remove the items of safety concern listed above.  Louetta Lame 02/25/2024, 10:21 AM

## 2024-02-25 NOTE — Progress Notes (Signed)
 Pt did attended rec therapy

## 2024-02-27 NOTE — Group Note (Signed)
 Date:  02/27/2024 Time:  5:39 PM  Group Topic/Focus:  Relapse Prevention Planning:   The focus of this group is to define relapse and discuss the need for planning to combat relapse. Natural ways to get dopamine hits as opposed to illicit drugs which cause psychosis.    Participation Level:  Did Not Attend  Wesley Beck 02/27/2024, 5:39 PM
